# Patient Record
Sex: Female | Born: 1970
Health system: Southern US, Community
[De-identification: ages and names within clinical notes are randomized; demographics above are authoritative.]

## PROBLEM LIST (undated history)

## (undated) DIAGNOSIS — M549 Dorsalgia, unspecified: Secondary | ICD-10-CM

## (undated) DIAGNOSIS — R7303 Prediabetes: Secondary | ICD-10-CM

## (undated) DIAGNOSIS — F32A Depression, unspecified: Secondary | ICD-10-CM

## (undated) DIAGNOSIS — D649 Anemia, unspecified: Secondary | ICD-10-CM

## (undated) DIAGNOSIS — K760 Fatty (change of) liver, not elsewhere classified: Secondary | ICD-10-CM

## (undated) DIAGNOSIS — K589 Irritable bowel syndrome without diarrhea: Secondary | ICD-10-CM

## (undated) DIAGNOSIS — N920 Excessive and frequent menstruation with regular cycle: Secondary | ICD-10-CM

## (undated) DIAGNOSIS — K829 Disease of gallbladder, unspecified: Secondary | ICD-10-CM

## (undated) DIAGNOSIS — K219 Gastro-esophageal reflux disease without esophagitis: Secondary | ICD-10-CM

## (undated) DIAGNOSIS — E7800 Pure hypercholesterolemia, unspecified: Secondary | ICD-10-CM

## (undated) DIAGNOSIS — F419 Anxiety disorder, unspecified: Secondary | ICD-10-CM

## (undated) DIAGNOSIS — T7840XA Allergy, unspecified, initial encounter: Secondary | ICD-10-CM

## (undated) DIAGNOSIS — G43909 Migraine, unspecified, not intractable, without status migrainosus: Secondary | ICD-10-CM

## (undated) DIAGNOSIS — N979 Female infertility, unspecified: Secondary | ICD-10-CM

## (undated) DIAGNOSIS — F329 Major depressive disorder, single episode, unspecified: Secondary | ICD-10-CM

## (undated) DIAGNOSIS — K5900 Constipation, unspecified: Secondary | ICD-10-CM

## (undated) DIAGNOSIS — M1990 Unspecified osteoarthritis, unspecified site: Secondary | ICD-10-CM

## (undated) DIAGNOSIS — M2550 Pain in unspecified joint: Secondary | ICD-10-CM

## (undated) HISTORY — DX: Excessive and frequent menstruation with regular cycle: N92.0

## (undated) HISTORY — DX: Constipation, unspecified: K59.00

## (undated) HISTORY — DX: Disease of gallbladder, unspecified: K82.9

## (undated) HISTORY — DX: Fatty (change of) liver, not elsewhere classified: K76.0

## (undated) HISTORY — DX: Anemia, unspecified: D64.9

## (undated) HISTORY — DX: Depression, unspecified: F32.A

## (undated) HISTORY — DX: Migraine, unspecified, not intractable, without status migrainosus: G43.909

## (undated) HISTORY — DX: Irritable bowel syndrome, unspecified: K58.9

## (undated) HISTORY — DX: Gastro-esophageal reflux disease without esophagitis: K21.9

## (undated) HISTORY — DX: Pure hypercholesterolemia, unspecified: E78.00

## (undated) HISTORY — DX: Major depressive disorder, single episode, unspecified: F32.9

## (undated) HISTORY — DX: Dorsalgia, unspecified: M54.9

## (undated) HISTORY — DX: Female infertility, unspecified: N97.9

## (undated) HISTORY — DX: Pain in unspecified joint: M25.50

## (undated) HISTORY — DX: Allergy, unspecified, initial encounter: T78.40XA

## (undated) HISTORY — PX: FOOT SURGERY: SHX648

## (undated) HISTORY — DX: Anxiety disorder, unspecified: F41.9

## (undated) HISTORY — DX: Unspecified osteoarthritis, unspecified site: M19.90

## (undated) HISTORY — PX: CHOLECYSTECTOMY: SHX55

---

## 1998-01-20 ENCOUNTER — Ambulatory Visit (HOSPITAL_COMMUNITY): Admission: RE | Admit: 1998-01-20 | Discharge: 1998-01-20 | Payer: Self-pay | Admitting: Gynecology

## 1998-02-01 ENCOUNTER — Inpatient Hospital Stay (HOSPITAL_COMMUNITY): Admission: AD | Admit: 1998-02-01 | Discharge: 1998-02-01 | Payer: Self-pay | Admitting: Obstetrics and Gynecology

## 1998-02-06 ENCOUNTER — Inpatient Hospital Stay (HOSPITAL_COMMUNITY): Admission: AD | Admit: 1998-02-06 | Discharge: 1998-02-06 | Payer: Self-pay | Admitting: Gynecology

## 1998-02-18 ENCOUNTER — Inpatient Hospital Stay (HOSPITAL_COMMUNITY): Admission: AD | Admit: 1998-02-18 | Discharge: 1998-02-20 | Payer: Self-pay | Admitting: Gynecology

## 1999-04-12 ENCOUNTER — Other Ambulatory Visit: Admission: RE | Admit: 1999-04-12 | Discharge: 1999-04-12 | Payer: Self-pay | Admitting: Obstetrics and Gynecology

## 1999-10-24 ENCOUNTER — Emergency Department (HOSPITAL_COMMUNITY): Admission: EM | Admit: 1999-10-24 | Discharge: 1999-10-24 | Payer: Self-pay | Admitting: Emergency Medicine

## 2000-03-18 ENCOUNTER — Emergency Department (HOSPITAL_COMMUNITY): Admission: EM | Admit: 2000-03-18 | Discharge: 2000-03-18 | Payer: Self-pay | Admitting: Emergency Medicine

## 2000-05-21 ENCOUNTER — Other Ambulatory Visit: Admission: RE | Admit: 2000-05-21 | Discharge: 2000-05-21 | Payer: Self-pay | Admitting: Gynecology

## 2000-10-30 HISTORY — PX: LIGAMENT REPAIR: SHX5444

## 2001-06-20 ENCOUNTER — Other Ambulatory Visit: Admission: RE | Admit: 2001-06-20 | Discharge: 2001-06-20 | Payer: Self-pay | Admitting: Gynecology

## 2001-12-27 ENCOUNTER — Emergency Department (HOSPITAL_COMMUNITY): Admission: EM | Admit: 2001-12-27 | Discharge: 2001-12-27 | Payer: Self-pay | Admitting: Unknown Physician Specialty

## 2002-06-20 ENCOUNTER — Other Ambulatory Visit: Admission: RE | Admit: 2002-06-20 | Discharge: 2002-06-20 | Payer: Self-pay | Admitting: Gynecology

## 2003-07-02 ENCOUNTER — Other Ambulatory Visit: Admission: RE | Admit: 2003-07-02 | Discharge: 2003-07-02 | Payer: Self-pay | Admitting: Gynecology

## 2004-07-06 ENCOUNTER — Other Ambulatory Visit: Admission: RE | Admit: 2004-07-06 | Discharge: 2004-07-06 | Payer: Self-pay | Admitting: Gynecology

## 2004-10-30 LAB — HM COLONOSCOPY: HM Colonoscopy: NORMAL

## 2004-11-16 ENCOUNTER — Encounter: Admission: RE | Admit: 2004-11-16 | Discharge: 2004-11-16 | Payer: Self-pay | Admitting: Internal Medicine

## 2004-12-26 ENCOUNTER — Ambulatory Visit (HOSPITAL_COMMUNITY): Admission: RE | Admit: 2004-12-26 | Discharge: 2004-12-26 | Payer: Self-pay

## 2004-12-26 ENCOUNTER — Encounter (INDEPENDENT_AMBULATORY_CARE_PROVIDER_SITE_OTHER): Payer: Self-pay | Admitting: *Deleted

## 2005-08-03 ENCOUNTER — Ambulatory Visit (HOSPITAL_COMMUNITY): Admission: RE | Admit: 2005-08-03 | Discharge: 2005-08-03 | Payer: Self-pay | Admitting: *Deleted

## 2005-08-03 ENCOUNTER — Encounter (INDEPENDENT_AMBULATORY_CARE_PROVIDER_SITE_OTHER): Payer: Self-pay | Admitting: Specialist

## 2005-08-03 HISTORY — PX: COLONOSCOPY: SHX174

## 2005-09-26 ENCOUNTER — Other Ambulatory Visit: Admission: RE | Admit: 2005-09-26 | Discharge: 2005-09-26 | Payer: Self-pay | Admitting: Gynecology

## 2005-09-29 HISTORY — PX: TUBAL LIGATION: SHX77

## 2005-10-11 ENCOUNTER — Inpatient Hospital Stay (HOSPITAL_COMMUNITY): Admission: EM | Admit: 2005-10-11 | Discharge: 2005-10-13 | Payer: Self-pay | Admitting: Gynecology

## 2005-10-11 ENCOUNTER — Ambulatory Visit (HOSPITAL_COMMUNITY): Admission: RE | Admit: 2005-10-11 | Discharge: 2005-10-11 | Payer: Self-pay | Admitting: Gynecology

## 2005-10-11 ENCOUNTER — Encounter (INDEPENDENT_AMBULATORY_CARE_PROVIDER_SITE_OTHER): Payer: Self-pay | Admitting: Specialist

## 2005-10-11 ENCOUNTER — Ambulatory Visit (HOSPITAL_BASED_OUTPATIENT_CLINIC_OR_DEPARTMENT_OTHER): Admission: RE | Admit: 2005-10-11 | Discharge: 2005-10-11 | Payer: Self-pay | Admitting: Gynecology

## 2005-10-11 HISTORY — PX: PELVIC LAPAROSCOPY: SHX162

## 2006-10-02 ENCOUNTER — Other Ambulatory Visit: Admission: RE | Admit: 2006-10-02 | Discharge: 2006-10-02 | Payer: Self-pay | Admitting: Gynecology

## 2006-10-09 IMAGING — CR DG CHEST 2V
2 series · 2 of 2 positions shown · non-contrast
Comparison: none

CLINICAL DATA: Left chest pain. 

 CHEST – TWO VIEW:

[view not recorded (1 of 2)]
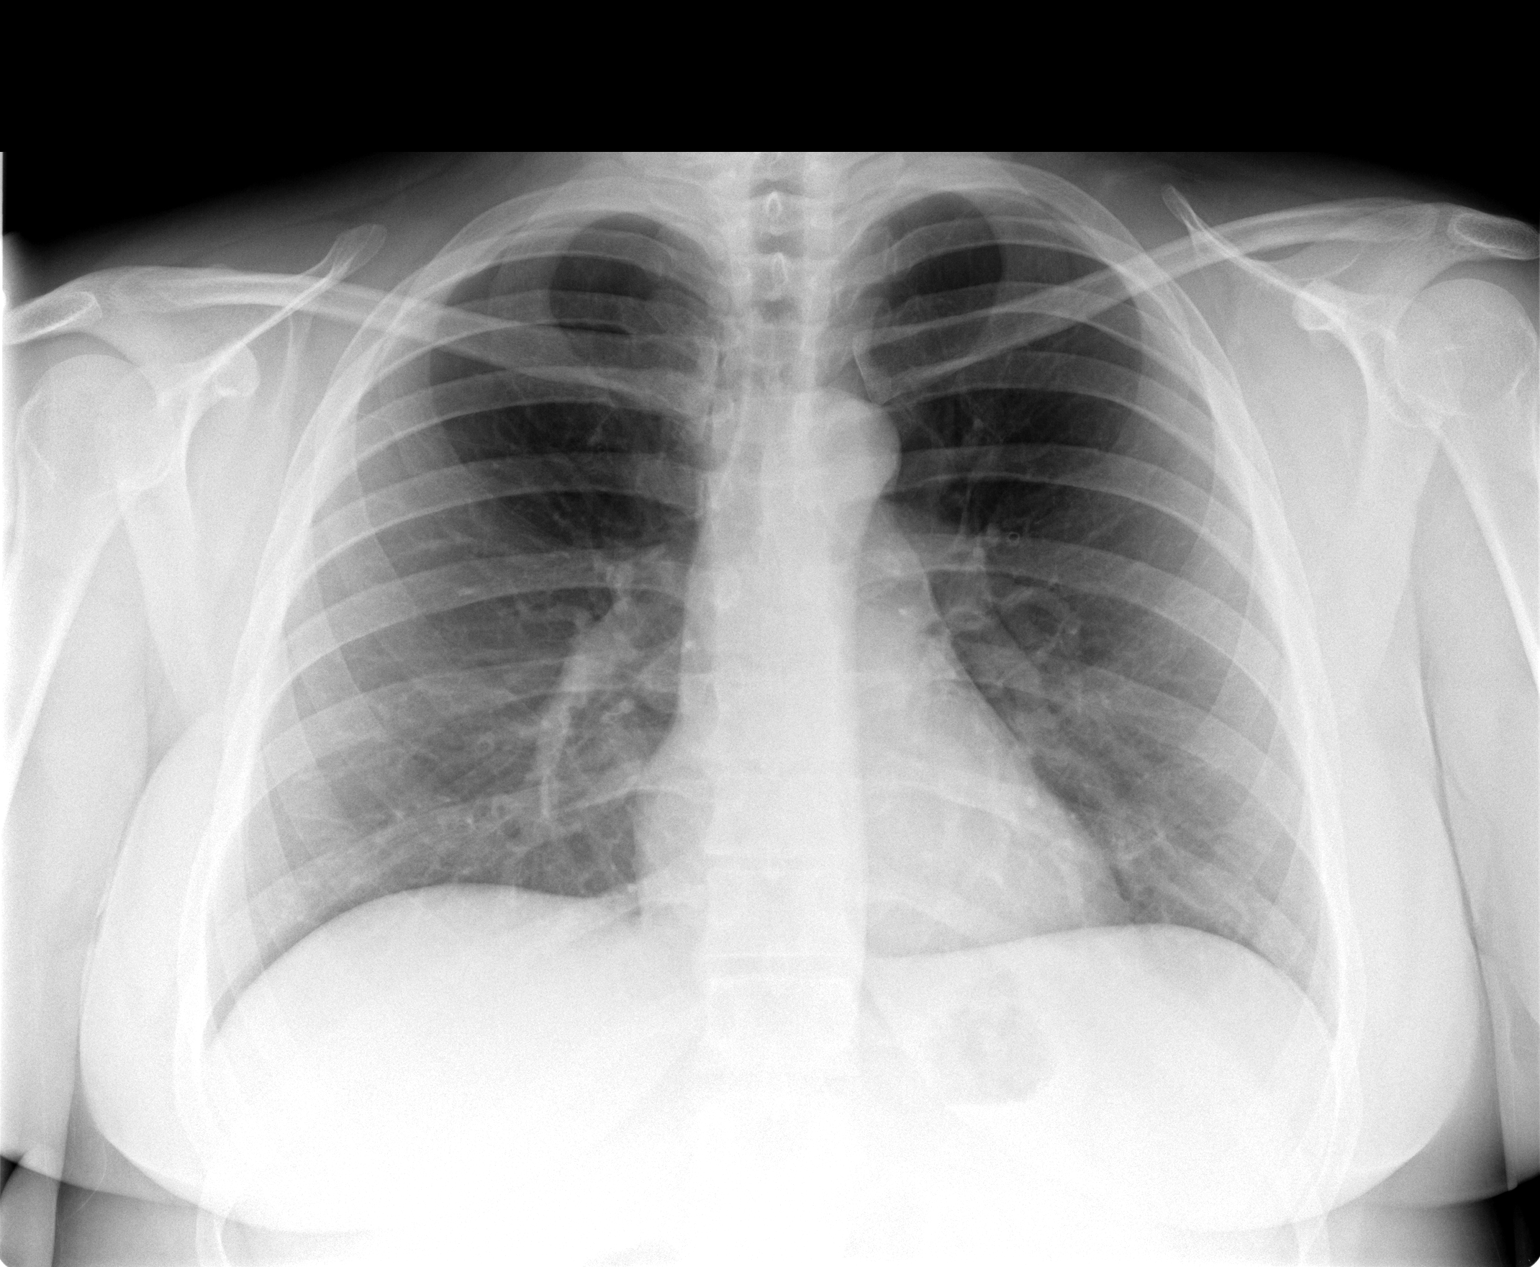

[view not recorded (2 of 2)]
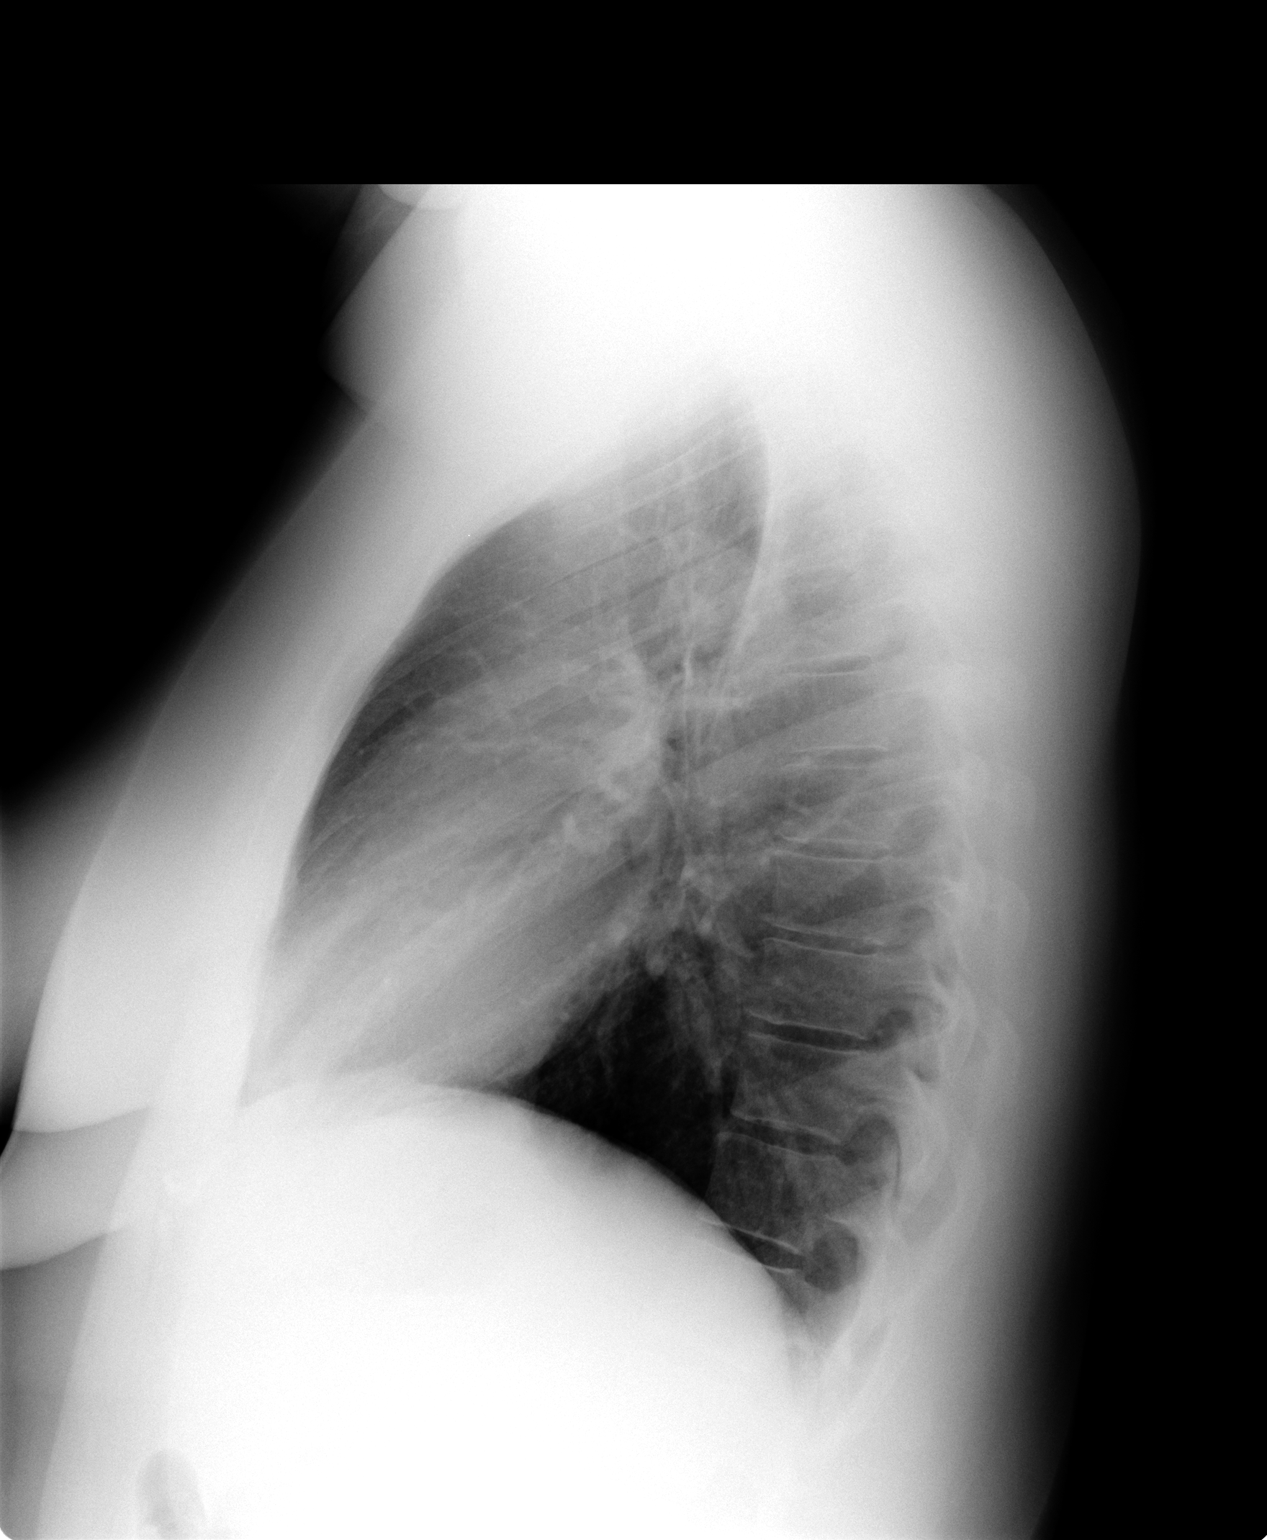

[2 of 2 positions shown; findings below may reference images not displayed]

FINDINGS: The heart size and mediastinal contours are normal. The lungs are clear. The visualized skeleton is unremarkable.
IMPRESSION: No active disease.

## 2007-10-08 ENCOUNTER — Other Ambulatory Visit: Admission: RE | Admit: 2007-10-08 | Discharge: 2007-10-08 | Payer: Self-pay | Admitting: Gynecology

## 2008-10-29 ENCOUNTER — Ambulatory Visit: Payer: Self-pay | Admitting: Gynecology

## 2008-10-29 ENCOUNTER — Other Ambulatory Visit: Admission: RE | Admit: 2008-10-29 | Discharge: 2008-10-29 | Payer: Self-pay | Admitting: Gynecology

## 2008-10-29 ENCOUNTER — Encounter: Payer: Self-pay | Admitting: Gynecology

## 2008-11-06 ENCOUNTER — Ambulatory Visit: Payer: Self-pay | Admitting: Gynecology

## 2008-11-09 ENCOUNTER — Ambulatory Visit: Payer: Self-pay | Admitting: Gynecology

## 2009-10-07 ENCOUNTER — Ambulatory Visit: Payer: Self-pay | Admitting: Gynecology

## 2009-11-24 ENCOUNTER — Ambulatory Visit: Payer: Self-pay | Admitting: Gynecology

## 2009-11-24 ENCOUNTER — Other Ambulatory Visit: Admission: RE | Admit: 2009-11-24 | Discharge: 2009-11-24 | Payer: Self-pay | Admitting: Gynecology

## 2009-11-25 ENCOUNTER — Encounter: Payer: Self-pay | Admitting: Internal Medicine

## 2009-11-25 ENCOUNTER — Ambulatory Visit: Payer: Self-pay | Admitting: Gynecology

## 2009-12-15 ENCOUNTER — Ambulatory Visit: Payer: Self-pay | Admitting: Internal Medicine

## 2009-12-15 DIAGNOSIS — J309 Allergic rhinitis, unspecified: Secondary | ICD-10-CM | POA: Insufficient documentation

## 2009-12-15 DIAGNOSIS — K219 Gastro-esophageal reflux disease without esophagitis: Secondary | ICD-10-CM | POA: Insufficient documentation

## 2009-12-15 DIAGNOSIS — F3289 Other specified depressive episodes: Secondary | ICD-10-CM | POA: Insufficient documentation

## 2009-12-15 DIAGNOSIS — D649 Anemia, unspecified: Secondary | ICD-10-CM | POA: Insufficient documentation

## 2009-12-15 DIAGNOSIS — E669 Obesity, unspecified: Secondary | ICD-10-CM | POA: Insufficient documentation

## 2009-12-15 DIAGNOSIS — R51 Headache: Secondary | ICD-10-CM | POA: Insufficient documentation

## 2009-12-15 DIAGNOSIS — R519 Headache, unspecified: Secondary | ICD-10-CM | POA: Insufficient documentation

## 2009-12-15 DIAGNOSIS — F329 Major depressive disorder, single episode, unspecified: Secondary | ICD-10-CM

## 2009-12-15 HISTORY — DX: Anemia, unspecified: D64.9

## 2010-03-10 ENCOUNTER — Ambulatory Visit: Payer: Self-pay | Admitting: Internal Medicine

## 2010-03-10 LAB — CONVERTED CEMR LAB
BUN: 15 mg/dL (ref 6–23)
Basophils Absolute: 0 10*3/uL (ref 0.0–0.1)
Basophils Relative: 0.3 % (ref 0.0–3.0)
CO2: 27 meq/L (ref 19–32)
Calcium: 8.8 mg/dL (ref 8.4–10.5)
Chloride: 107 meq/L (ref 96–112)
Creatinine, Ser: 0.8 mg/dL (ref 0.4–1.2)
Eosinophils Absolute: 0.1 10*3/uL (ref 0.0–0.7)
Eosinophils Relative: 1.1 % (ref 0.0–5.0)
Ferritin: 3.8 ng/mL — ABNORMAL LOW (ref 10.0–291.0)
Folate: 6.4 ng/mL
GFR calc non Af Amer: 83.95 mL/min (ref >60)
Glucose, Bld: 88 mg/dL (ref 70–99)
HCT: 34.6 % — ABNORMAL LOW (ref 36.0–46.0)
Hemoglobin: 11.8 g/dL — ABNORMAL LOW (ref 12.0–15.0)
Hgb A1c MFr Bld: 6.2 % (ref 4.6–6.5)
Iron: 35 ug/dL — ABNORMAL LOW (ref 42–145)
Lymphocytes Relative: 21.2 % (ref 12.0–46.0)
Lymphs Abs: 1.7 10*3/uL (ref 0.7–4.0)
MCHC: 34 g/dL (ref 30.0–36.0)
MCV: 80 fL (ref 78.0–100.0)
Monocytes Absolute: 0.5 10*3/uL (ref 0.1–1.0)
Monocytes Relative: 6.3 % (ref 3.0–12.0)
Neutro Abs: 5.5 10*3/uL (ref 1.4–7.7)
Neutrophils Relative %: 71.1 % (ref 43.0–77.0)
Platelets: 303 10*3/uL (ref 150.0–400.0)
Potassium: 4.4 meq/L (ref 3.5–5.1)
RBC: 4.32 M/uL (ref 3.87–5.11)
RDW: 16.2 % — ABNORMAL HIGH (ref 11.5–14.6)
Saturation Ratios: 7 % — ABNORMAL LOW (ref 20.0–50.0)
Sodium: 141 meq/L (ref 135–145)
Transferrin: 356.1 mg/dL (ref 212.0–360.0)
Vit D, 25-Hydroxy: 40 ng/mL (ref 30–89)
Vitamin B-12: 274 pg/mL (ref 211–911)
WBC: 7.8 10*3/uL (ref 4.5–10.5)

## 2010-03-17 ENCOUNTER — Ambulatory Visit: Payer: Self-pay | Admitting: Internal Medicine

## 2010-03-17 DIAGNOSIS — R109 Unspecified abdominal pain: Secondary | ICD-10-CM | POA: Insufficient documentation

## 2010-04-22 ENCOUNTER — Ambulatory Visit: Payer: Self-pay | Admitting: Gynecology

## 2010-07-05 ENCOUNTER — Ambulatory Visit: Payer: Self-pay | Admitting: Internal Medicine

## 2010-07-05 LAB — CONVERTED CEMR LAB
Basophils Absolute: 0 10*3/uL (ref 0.0–0.1)
Hemoglobin: 12.3 g/dL (ref 12.0–15.0)
Hgb A1c MFr Bld: 6.3 % (ref 4.6–6.5)
Lymphocytes Relative: 22.3 % (ref 12.0–46.0)
Monocytes Relative: 5.3 % (ref 3.0–12.0)
Neutro Abs: 6.3 10*3/uL (ref 1.4–7.7)
Neutrophils Relative %: 71.1 % (ref 43.0–77.0)
RBC: 4.32 M/uL (ref 3.87–5.11)
RDW: 15.3 % — ABNORMAL HIGH (ref 11.5–14.6)

## 2010-07-12 ENCOUNTER — Ambulatory Visit: Payer: Self-pay | Admitting: Internal Medicine

## 2010-10-07 ENCOUNTER — Encounter: Payer: Self-pay | Admitting: Internal Medicine

## 2010-10-07 ENCOUNTER — Ambulatory Visit: Payer: Self-pay | Admitting: Internal Medicine

## 2010-10-07 LAB — CONVERTED CEMR LAB
Basophils Absolute: 0 10*3/uL (ref 0.0–0.1)
Eosinophils Relative: 1 % (ref 0.0–5.0)
Ferritin: 5.8 ng/mL — ABNORMAL LOW (ref 10.0–291.0)
HCT: 39.2 % (ref 36.0–46.0)
IgA: 218 mg/dL (ref 68–378)
Lymphocytes Relative: 21.8 % (ref 12.0–46.0)
Monocytes Relative: 5.3 % (ref 3.0–12.0)
Neutrophils Relative %: 71.7 % (ref 43.0–77.0)
Platelets: 296 10*3/uL (ref 150.0–400.0)
RDW: 15.6 % — ABNORMAL HIGH (ref 11.5–14.6)
Tissue Transglutaminase Ab, IgA: 9.4 units (ref <20)
WBC: 8.1 10*3/uL (ref 4.5–10.5)

## 2010-10-17 ENCOUNTER — Ambulatory Visit: Payer: Self-pay | Admitting: Internal Medicine

## 2010-10-17 DIAGNOSIS — D509 Iron deficiency anemia, unspecified: Secondary | ICD-10-CM | POA: Insufficient documentation

## 2010-10-30 HISTORY — PX: KNEE ARTHROSCOPY: SUR90

## 2010-12-01 NOTE — Assessment & Plan Note (Signed)
Summary: 4 month rov/njr   Vital Signs:  Patient profile:   40 year old female Menstrual status:  regular LMP:     06/23/2010 Weight:      224 pounds Pulse rate:   88 / minute BP sitting:   120 / 80  (right arm) Cuff size:   large  Vitals Entered By: Romualdo Bolk, CMA (AAMA) (July 12, 2010 11:27 AM) CC: Follow-up visit on labs LMP (date): 06/23/2010 LMP - Character: light Menarche (age onset years): 10   Menses interval (days): 28 Menstrual flow (days): 1 Enter LMP: 06/23/2010 Last PAP Result normal   History of Present Illness: Brandy Maynard comesin for follow up of her  anemia irondeficincy and blood sugar. Since last visit  no chnage in status . Saw Dr Lily Peer  for back  pelvic pain and no reasonable interventionf or now.     taking  iron only a few times a week  with Vit c  constipated   with more  .  Scant periods now on ocps . Iron can bother her  ibs    usually   eating more red meat and spinach.     Preventive Screening-Counseling & Management  Alcohol-Tobacco     Alcohol drinks/day: <1     Alcohol type: wine     Smoking Status: never  Caffeine-Diet-Exercise     Caffeine use/day: 1 - 2     Does Patient Exercise: yes  Current Medications (verified): 1)  Prilosec Otc 20 Mg Tbec (Omeprazole Magnesium) .Marland Kitchen.. 1 By Mouth Once Daily 2)  Loestrin 24 Fe 1-20 Mg-Mcg Tabs (Norethin Ace-Eth Estrad-Fe) .Marland Kitchen.. 1 By Mouth Once Daily 3)  Aldactone 25 Mg Tabs (Spironolactone) .... Takes During Her Menes  Allergies (verified): 1)  ! Penicillin  Past History:  Past medical, surgical, family and social histories (including risk factors) reviewed, and no changes noted (except as noted below).  Past Medical History: G3P2 Allergic rhinitis Depression GERD Headache Migraines  Heavy menses on ocps     anemia iron defic mild IBS by hx  and had neg colonscopy.   Past Surgical History: Reviewed history from 03/17/2010 and no changes  required. Cholecystectomy Tubal Ligation/Scar Tissure removal 09/2005 ovarian cyst. Rt ankle Ligament repair 2002  Past History:  Care Management: Gynecology: Lily Peer  Family History: Reviewed history from 12/15/2009 and no changes required. Father: Smoker Mother: Arthritis Siblings: Sister- Healthy Dm in grandparent generation.  LIPids [place]  Social History: Reviewed history from 12/15/2009 and no changes required. 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    Physical Exam  General:  Well-developed,well-nourished,in no acute distress; alert,appropriate and cooperative throughout examination Head:  normocephalic and atraumatic.   Psych:  good eye contact.   reviewed labs and  evaluation  Impression & Recommendations:  Problem # 1:  ANEMIA (ICD-285.9) Assessment Improved  hg good   still low ferritin  3.8   Hgb: 12.3 (07/05/2010)   Hct: 36.0 (07/05/2010)   Platelets: 282.0 (07/05/2010) RBC: 4.32 (07/05/2010)   RDW: 15.3 (07/05/2010)   WBC: 8.8 (07/05/2010) MCV: 83.3 (07/05/2010)   MCHC: 34.2 (07/05/2010) Ferritin: 3.8 (07/05/2010) Iron: 35 (03/10/2010)   % Sat: 7.0 (03/10/2010) B12: 274 (03/10/2010)   Folate: 6.4 (03/10/2010)     Problem # 2:  HYPERGLYCEMIA (ICD-790.29) Assessment: Unchanged  lifestyle  intervention strategies and weight loss a1c 6.3 Labs Reviewed: Creat: 0.8 (03/10/2010)     Problem # 3:  OBESITY (ICD-278.00) continue weight watchers and weight loss   Problem # 4:  ? IBS  reported  prev eval   Complete Medication List: 1)  Prilosec Otc 20 Mg Tbec (Omeprazole magnesium) .Marland Kitchen.. 1 by mouth once daily 2)  Loestrin 24 Fe 1-20 Mg-mcg Tabs (Norethin ace-eth estrad-fe) .Marland Kitchen.. 1 by mouth once daily 3)  Aldactone 25 Mg  Tabs (Spironolactone) .... Takes during her menes  Other Orders: Admin 1st Vaccine (16109) Flu Vaccine 77yrs + [phone])  Patient Instructions: 1)  continue  iron daily  as possible.  2)  fruit ok for constipation. 3)  losing weight will help your blood sugar .  4)  in 3 months    ferritin  cbc, diff  and Celiac panel , Total immunoglobulin A  5)  dx   iron deficiency      Flu Vaccine Consent Questions     Do you have a history of severe allergic reactions to this vaccine? no    Any prior history of allergic reactions to egg and/or gelatin? no    Do you have a sensitivity to the preservative Thimersol? no    Do you have a past history of Guillan-Barre Syndrome? no    Do you currently have an acute febrile illness? no    Have you ever had a severe reaction to latex? no    Vaccine information given and explained to patient? yes    Are you currently pregnant? no    Lot Number:[id number]   Exp Date:04/29/2011   Site Given  Left Deltoid IMlu Romualdo Bolk, CMA (AAMA)  July 12, 2010 11:31 AM

## 2010-12-01 NOTE — Assessment & Plan Note (Signed)
Summary: 3 MONTH FOLLOW UP/CJR/pt rescd from bump//ccm   Vital Signs:  Patient profile:   40 year old female Menstrual status:  regular LMP:     10/09/2010 Height:      65.25 inches Weight:      229 pounds BMI:     37.95 Pulse rate:   80 / minute BP sitting:   120 / 80  (left arm) Cuff size:   large  Vitals Entered By: Romualdo Bolk, CMA (AAMA) (October 17, 2010 1:23 PM) CC: Follow-up visit on labs LMP (date): 10/09/2010 LMP - Character: light Menarche (age onset years): 10   Menses interval (days): 28 Menstrual flow (days): 1 Enter LMP: 10/09/2010 Last PAP Result normal   History of Present Illness: Brandy Maynard  comes in today  for follow up of her  iron deficincy .Marland KitchenMarland KitchenSince last visit she has only  taken   2 x per  week.  because of constipaton se .    So in the past 2 weeks   has taken slow iron about 4 days per week.  rtied  to decrease the prolosec to every other day fora while recently back up for   signs  Also had some   orthopedic symptoms with pain  Left middle finger  and cyst ..alos wrist  pain    and has plica in right knee. Got  a   steroid sho t  via Dr Marcellus Scott some temporary help. .    Had labs tests and ok  for inflammatory problem   and vit d was high   ? normal .. Not on a supplement       Preventive Screening-Counseling & Management  Alcohol-Tobacco     Alcohol drinks/day: <1     Alcohol type: wine     Smoking Status: never  Caffeine-Diet-Exercise     Caffeine use/day: 1 - 2     Does Patient Exercise: yes  Current Medications (verified): 1)  Prilosec Otc 20 Mg Tbec (Omeprazole Magnesium) .Marland Kitchen.. 1 By Mouth Once Daily 2)  Loestrin 24 Fe 1-20 Mg-Mcg Tabs (Norethin Ace-Eth Estrad-Fe) .Marland Kitchen.. 1 By Mouth Once Daily 3)  Aldactone 25 Mg Tabs (Spironolactone) .... Takes During Her Menes  Allergies (verified): 1)  ! Penicillin  Past History:  Past medical, surgical, family and social histories (including risk factors) reviewed for relevance to  current acute and chronic problems.  Past Medical History: Reviewed history from 07/12/2010 and no changes required. G3P2 Allergic rhinitis Depression GERD Headache Migraines  Heavy menses on ocps     anemia iron defic mild IBS by hx  and had neg colonscopy.   Past Surgical History: Reviewed history from 03/17/2010 and no changes required. Cholecystectomy Tubal Ligation/Scar Tissure removal 09/2005 ovarian cyst. Rt ankle Ligament repair 2002  Past History:  Care Management: Gynecology: Domingo Mend  Family History: Reviewed history from 12/15/2009 and no changes required. Father: Smoker Mother: Arthritis Siblings: Sister- Healthy Dm in grandparent generation.  LIPids [place]  Social History: Reviewed history from 12/15/2009 and no changes required. 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         ocass hemorrhoid  Physical Exam  General:  Well-developed,well-nourished,in no acute distress; alert,appropriate and cooperative throughout examination Psych:  good eye contact, not anxious appearing, and not depressed appearing.   reviewed labs  Impression & Recommendations:  Problem # 1:  ANEMIA (ICD-285.9)  better but stil iron deficient.     disc this and will take iron more reg and follow up  when iron stores repleted can stop iron and see how she does.   Hgb: 13.3 (10/07/2010)   Hct: 39.2 (10/07/2010)   Platelets: 296.0 (10/07/2010) RBC: 4.58 (10/07/2010)   RDW: 15.6 (10/07/2010)   WBC: 8.1 (10/07/2010) MCV: 85.4 (10/07/2010)   MCHC: 33.9 (10/07/2010) Ferritin: 5.8 (10/07/2010) Iron: 35 (03/10/2010)   % Sat: 7.0 (03/10/2010) B12: 274 (03/10/2010)   Folate: 6.4 (03/10/2010)     Problem # 2:  GERD (ICD-530.81) counseled  weight loss will help.  pot aware.  Her  updated medication list for this problem includes:    Prilosec Otc 20 Mg Tbec (Omeprazole magnesium) .Marland Kitchen... 1 by mouth once daily  Problem # 3:  HYPERGLYCEMIA (ICD-790.29)  going back to weight watchers   dm runs in family.     aware   Labs Reviewed: Creat: 0.8 (03/10/2010)     Complete Medication List: 1)  Prilosec Otc 20 Mg Tbec (Omeprazole magnesium) .Marland Kitchen.. 1 by mouth once daily 2)  Loestrin 24 Fe 1-20 Mg-mcg Tabs (Norethin ace-eth estrad-fe) .Marland Kitchen.. 1 by mouth once daily 3)  Aldactone 25 Mg Tabs (Spironolactone) .... Takes during her menes  Patient Instructions: 1)  get copy of labs  sent to [place] for our records.    2)  consider a pill box  to track your  meds. 3)  try taking the iron separate from  the prilosec to  possibly enhance absorption.    every day. 4)  cbc diff  ferritin   hga1c  in March and return office visit.  5)  dx  hyperglycemia  and iron deficiency .     Orders Added: 1)  Est. Patient Level III [16109]

## 2010-12-01 NOTE — Assessment & Plan Note (Signed)
Summary: fup labs/cc   Vital Signs:  Patient profile:   40 year old female Menstrual status:  regular Height:      65.25 inches Weight:      228 pounds BP sitting:   110 / 80  (right arm) CC: FU labs, Depression, Headache   History of Present Illness: Brandy Maynard comes in today   for follow up of  above problems . Since last visit  here  there have been no major changes in health status  . Has  lost 19 pound. s Periods :     Concern about cyst in ovaries.   minimal  W/d bleeding   but last month 3 days of bleeding.  some llq pain to back.  and uses heating pad.    H sof   cysts on rigfht.   hx of scariing  right side.  Mood   Some stress    external.   weaned off antidepressant.  Sleep ok.      Preventive Screening-Counseling & Management  Alcohol-Tobacco     Alcohol drinks/day: <1     Alcohol type: wine     Smoking Status: never  Caffeine-Diet-Exercise     Caffeine use/day: 1 - 2     Does Patient Exercise: yes  Current Medications (verified): 1)  Prilosec Otc 20 Mg Tbec (Omeprazole Magnesium) .Marland Kitchen.. 1 By Mouth Once Daily 2)  Loestrin 24 Fe 1-20 Mg-Mcg Tabs (Norethin Ace-Eth Estrad-Fe) .Marland Kitchen.. 1 By Mouth Once Daily 3)  Aldactone 25 Mg Tabs (Spironolactone) .... Takes During Her Menes  Allergies (verified): 1)  ! Penicillin  Past History:  Past medical, surgical, family and social histories (including risk factors) reviewed, and no changes noted (except as noted below).  Past Medical History: Reviewed history from 12/15/2009 and no changes required. G3P2 Allergic rhinitis Depression GERD Headache Migraines   Past Surgical History: Cholecystectomy Tubal Ligation/Scar Tissure removal 09/2005 ovarian cyst. Rt ankle Ligament repair 2002  Family History: Reviewed history from 12/15/2009 and no changes required. Father: Smoker Mother: Arthritis Siblings: Sister- Healthy Dm in grandparent generation.  LIPids [place]  Social History: Reviewed history from  12/15/2009 and no changes required. 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    Physical Exam  General:  Well-developed,well-nourished,in no acute distress; alert,appropriate and cooperative throughout examination Head:  normocephalic and atraumatic.   Neck:  No deformities, masses, or tenderness noted. Lungs:  normal respiratory effort, no intercostal retractions, and no accessory muscle use.   Heart:  Normal rate and regular rhythm. S1 and S2 normal without gallop, murmur, click, rub or other extra sounds.no lifts.   Abdomen:  Bowel sounds positive,abdomen soft and non-tender without masses, organomegaly or   noted. point to low left pelvica rea as area of discomfort  Pulses:  pulses intact without delay   Extremities:  no clubbing cyanosis or edema  Neurologic:  non focal  Skin:  turgor normal, color normal, no ecchymoses, and no petechiae.   Cervical Nodes:  No lymphadenopathy noted Psych:  Oriented X3, good eye contact, not anxious appearing, and not depressed appearing.     Impression & Recommendations:  Problem # 1:  ANEMIA (ICD-285.9)  needs recheck   no excessive bleeding  Hgb:  11.8 (03/10/2010)   Hct: 34.6 (03/10/2010)   Platelets: 303.0 (03/10/2010) RBC: 4.32 (03/10/2010)   RDW: 16.2 (03/10/2010)   WBC: 7.8 (03/10/2010) MCV: 80.0 (03/10/2010)   MCHC: 34.0 (03/10/2010) Ferritin: 3.8 (03/10/2010) Iron: 35 (03/10/2010)   % Sat: 7.0 (03/10/2010) B12: 274 (03/10/2010)   Folate: 6.4 (03/10/2010)     Problem # 2:  PELVIC PAIN, LEFT (ICD-789.09)  Problem # 3:  OBESITY (ICD-278.00) Assessment: Improved 19 # weight loss    continue   encouraged   continue weight watchers.   Problem # 4:  HYPERGLYCEMIA (ICD-790.29) Assessment: Improved fbs food  a1c could be better   counseled and . follow up  in 3-4 months   Problem # 5:  DEPRESSION (ICD-311) Assessment: Improved doing ok off medications.     Problem # 6:  GERD (ICD-530.81) Assessment: Improved  Her updated medication list for this problem includes:    Prilosec Otc 20 Mg Tbec (Omeprazole magnesium) .Marland Kitchen... 1 by mouth once daily  Complete Medication List: 1)  Prilosec Otc 20 Mg Tbec (Omeprazole magnesium) .Marland Kitchen.. 1 by mouth once daily 2)  Loestrin 24 Fe 1-20 Mg-mcg Tabs (Norethin ace-eth estrad-fe) .Marland Kitchen.. 1 by mouth once daily 3)  Aldactone 25 Mg Tabs (Spironolactone) .... Takes during her menes  Patient Instructions: 1)  would get  back with Dr Steward Ros about the  pelvic pain calendar the signs in the mean time.  2)  Increase iron    can take 250 mg vit d with an iron pill  once a day or as tolerated to increase absortpion. 3)  B12 is slightly low   can increase foods high in vit b12 . 4)  continue healthy weight loss. 5)  CBC w/ Diff prior to visit ICD-9 :  6)  ferritin :  dx anemia  7)  HgBA1c prior to visit  ICD-9:  hyperglycemia  8)  Please schedule a follow-up appointment in 3 -4 months .

## 2010-12-01 NOTE — Assessment & Plan Note (Signed)
Summary: brand new patient/to establish/cjr   Vital Signs:  Patient profile:   40 year old female Menstrual status:  regular LMP:     12/04/2009 Height:      65.25 inches Weight:      238 pounds BMI:     39.44 Pulse rate:   66 / minute BP sitting:   110 / 70  (right arm) Cuff size:   large  Vitals Entered By: Romualdo Bolk, CMA (AAMA) (December 15, 2009 3:43 PM) CC: New Pt to get establish- Seen at Urgent Care for Bronchitic on 2/12. Pt was given biaxin and a cough syrup. LMP (date): 12/04/2009 LMP - Character: light Menarche (age onset years): 10   Menses interval (days): 28 Menstrual flow (days): 1 Menstrual Status regular Enter LMP: 12/04/2009 Last PAP Result normal   History of Present Illness: Brandy Maynard comesin today for first  time   visit  .  Was given  hydrocodone   onset  Feb 12 and    now getting better  but still  hoarseness.    used to See   Dr Delrae Alfred.   SHe is no longer in pvt practice  GERD  : nexium and then prilosec  otc    some break through   on meds about 10 years.   nocturnal no endoscopy and no EGJ.    OCps and on aldactone  week of period for days of cycle.  Mood Just went went off  wellbutrin  in december  last year.   and went off.  though it caused weight gain migraines   ocassinal  stress is a trigger.   uses.  ? old rx med.   midrin.   GYNE  did labs   nl tsh  hg 11.9  lipids nl .  Preventive Care Screening  Pap Smear:    Date:  11/24/2009    Results:  normal   Colonoscopy:    Date:  10/30/2004    Results:  normal    Preventive Screening-Counseling & Management  Alcohol-Tobacco     Alcohol drinks/day: <1     Alcohol type: wine     Smoking Status: never  Caffeine-Diet-Exercise     Caffeine use/day: 1     Does Patient Exercise: yes  Hep-HIV-STD-Contraception     Dental Visit-last 6 months yes     Sun Exposure-Excessive: no  Safety-Violence-Falls     Seat Belt Use: yes     Smoke Detectors: yes      Drug Use:  no.     Current Medications (verified): 1)  Prilosec Otc 20 Mg Tbec (Omeprazole Magnesium) .Marland Kitchen.. 1 By Mouth Once Daily 2)  Loestrin 24 Fe 1-20 Mg-Mcg Tabs (Norethin Ace-Eth Estrad-Fe) .Marland Kitchen.. 1 By Mouth Once Daily 3)  Aldactone 25 Mg Tabs (Spironolactone) .... Takes During Her Menes  Allergies (verified): 1)  ! Penicillin  Past History:  Past Medical History: G3P2 Allergic rhinitis Depression GERD Headache Migraines   Past Surgical History: Cholecystectomy Tubal Ligation/Scar Tissure removal 09/2005 Rt ankle Ligament repair 2002  Past History:  Care Management: Gynecology: Lily Peer  Family History: Father: Smoker Mother: Arthritis Siblings: Sister- Healthy Dm in grandparent generation.  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 Drug use-no Regular exercise-yes Caffeine use/day:  1 Smoking Status:  never Does Patient Exercise:  yes Occupation:  employed Drug Use:  no Seat Belt Use:  yes Sun Exposure-Excessive:  no Dental Care w/in  6 mos.:  yes  Review of Systems  The patient denies anorexia, fever, weight loss, vision loss, decreased hearing, hoarseness, chest pain, syncope, dyspnea on exertion, peripheral edema, prolonged cough, abdominal pain, melena, hematochezia, severe indigestion/heartburn, hematuria, muscle weakness, difficulty walking, depression, unusual weight change, abnormal bleeding, enlarged lymph nodes, and angioedema.         takes aldactone as needed periods for fluid retention  gerd takesp rilosec  Physical Exam  General:  alert, well-developed, well-nourished, and well-hydrated.   Head:  normocephalic and atraumatic.   Eyes:  vision grossly intact, pupils equal, and pupils round.   Ears:  R ear normal, L ear normal, and no external deformities.   Nose:  no external deformity, no external erythema, and no nasal discharge.  congested  Mouth:  pharynx pink and moist.   Neck:  No  deformities, masses, or tenderness noted. Lungs:  Normal respiratory effort, chest expands symmetrically. Lungs are clear to auscultation, no crackles or wheezes.no dullness.   Heart:  Normal rate and regular rhythm. S1 and S2 normal without gallop, murmur, click, rub or other extra sounds.no lifts.   Abdomen:  Bowel sounds positive,abdomen soft and non-tender without masses, organomegaly or   noted. Pulses:  pulses intact without delay   Extremities:  no clubbing cyanosis or edema  Neurologic:  alert & oriented X3, strength normal in all extremities, and gait normal.   Skin:  turgor normal, color normal, no ecchymoses, and no petechiae.   Cervical Nodes:  No lymphadenopathy noted Psych:  Oriented X3, good eye contact, not anxious appearing, and not depressed appearing.     Impression & Recommendations:  Problem # 1:  GERD (ICD-530.81)  Her updated medication list for this problem includes:    Prilosec Otc 20 Mg Tbec (Omeprazole magnesium) .Marland Kitchen... 1 by mouth once daily  Problem # 2:  HYPERGLYCEMIA (ICD-790.29)  Problem # 3:  ANEMIA (ICD-285.9) 11.9 from her gyne  Problem # 4:  resolving uri   Problem # 5:  ALLERGIC RHINITIS (ICD-477.9)  Problem # 6:  DEPRESSION (ICD-311) Assessment: Comment Only  Problem # 7:  OBESITY (ICD-278.00) Assessment: Comment Only  Complete Medication List: 1)  Prilosec Otc 20 Mg Tbec (Omeprazole magnesium) .Marland Kitchen.. 1 by mouth once daily 2)  Loestrin 24 Fe 1-20 Mg-mcg Tabs (Norethin ace-eth estrad-fe) .Marland Kitchen.. 1 by mouth once daily 3)  Aldactone 25 Mg Tabs (Spironolactone) .... Takes during her menes  Patient Instructions: 1)  Continue weight watcher s and healthy lifestyle intervention  2)  in  April or May   do labs  the ROV .  3)  fasting BMP, Hg a1c, Vit amin b12/folicacid,  iron saturation panel , ferritin  , cbc diff plts.  vitamin D .  4)  Dx hyperglycemia and anemia.

## 2010-12-05 ENCOUNTER — Encounter: Payer: Self-pay | Admitting: Gynecology

## 2010-12-16 ENCOUNTER — Other Ambulatory Visit (HOSPITAL_COMMUNITY)
Admission: RE | Admit: 2010-12-16 | Discharge: 2010-12-16 | Disposition: A | Discharge status: Home / Self Care | Payer: 59 | Source: Ambulatory Visit | Attending: Gynecology | Admitting: Gynecology

## 2010-12-16 ENCOUNTER — Other Ambulatory Visit: Payer: Self-pay | Admitting: Gynecology

## 2010-12-16 ENCOUNTER — Encounter (INDEPENDENT_AMBULATORY_CARE_PROVIDER_SITE_OTHER): Payer: 59 | Admitting: Gynecology

## 2010-12-16 DIAGNOSIS — Z01419 Encounter for gynecological examination (general) (routine) without abnormal findings: Secondary | ICD-10-CM | POA: Insufficient documentation

## 2010-12-16 DIAGNOSIS — R635 Abnormal weight gain: Secondary | ICD-10-CM

## 2010-12-16 DIAGNOSIS — Z833 Family history of diabetes mellitus: Secondary | ICD-10-CM

## 2010-12-16 DIAGNOSIS — Z13220 Encounter for screening for lipoid disorders: Secondary | ICD-10-CM

## 2010-12-26 ENCOUNTER — Other Ambulatory Visit: Payer: Self-pay | Admitting: Sports Medicine

## 2010-12-26 DIAGNOSIS — R52 Pain, unspecified: Secondary | ICD-10-CM

## 2010-12-30 ENCOUNTER — Other Ambulatory Visit: Payer: 59

## 2011-01-01 ENCOUNTER — Ambulatory Visit
Admission: RE | Admit: 2011-01-01 | Discharge: 2011-01-01 | Disposition: A | Discharge status: Home / Self Care | Payer: 59 | Source: Ambulatory Visit | Attending: Sports Medicine | Admitting: Sports Medicine

## 2011-01-01 DIAGNOSIS — R52 Pain, unspecified: Secondary | ICD-10-CM

## 2011-01-10 ENCOUNTER — Other Ambulatory Visit (INDEPENDENT_AMBULATORY_CARE_PROVIDER_SITE_OTHER): Payer: 59 | Admitting: Internal Medicine

## 2011-01-10 DIAGNOSIS — D649 Anemia, unspecified: Secondary | ICD-10-CM

## 2011-01-10 DIAGNOSIS — R739 Hyperglycemia, unspecified: Secondary | ICD-10-CM

## 2011-01-10 DIAGNOSIS — R7309 Other abnormal glucose: Secondary | ICD-10-CM

## 2011-01-10 LAB — CBC WITH DIFFERENTIAL/PLATELET
Basophils Relative: 0.4 % (ref 0.0–3.0)
Eosinophils Absolute: 0.1 10*3/uL (ref 0.0–0.7)
Hemoglobin: 13.2 g/dL (ref 12.0–15.0)
Lymphocytes Relative: 16.7 % (ref 12.0–46.0)
Monocytes Relative: 5.3 % (ref 3.0–12.0)
Neutro Abs: 6.8 10*3/uL (ref 1.4–7.7)
Neutrophils Relative %: 76.9 % (ref 43.0–77.0)
RBC: 4.52 Mil/uL (ref 3.87–5.11)
WBC: 8.9 10*3/uL (ref 4.5–10.5)

## 2011-01-10 LAB — HEMOGLOBIN A1C: Hgb A1c MFr Bld: 6.2 % (ref 4.6–6.5)

## 2011-01-11 HISTORY — PX: OTHER SURGICAL HISTORY: SHX169

## 2011-01-12 ENCOUNTER — Encounter: Payer: Self-pay | Admitting: Internal Medicine

## 2011-01-17 ENCOUNTER — Encounter: Payer: Self-pay | Admitting: Internal Medicine

## 2011-01-17 ENCOUNTER — Ambulatory Visit (INDEPENDENT_AMBULATORY_CARE_PROVIDER_SITE_OTHER): Payer: 59 | Admitting: Internal Medicine

## 2011-01-17 VITALS — BP 120/80 | HR 72 | Wt 227.0 lb

## 2011-01-17 DIAGNOSIS — D649 Anemia, unspecified: Secondary | ICD-10-CM

## 2011-01-17 DIAGNOSIS — R7309 Other abnormal glucose: Secondary | ICD-10-CM

## 2011-01-17 DIAGNOSIS — E669 Obesity, unspecified: Secondary | ICD-10-CM

## 2011-01-17 DIAGNOSIS — D509 Iron deficiency anemia, unspecified: Secondary | ICD-10-CM

## 2011-01-17 NOTE — Patient Instructions (Signed)
Take the iron as much as possible and twice during period and youo should do well. Call for lab appt  Before a yearly visit in February or there about. Dietary changes as you are doing . Call  If needed

## 2011-01-21 ENCOUNTER — Encounter: Payer: Self-pay | Admitting: Internal Medicine

## 2011-01-21 NOTE — Progress Notes (Signed)
  Subjective:    Patient ID: Clance Boll, female    DOB: [birth year], 40 y.o.   MRN: [id number]  HPI Patient comes in today with mom regarding   fu of iron  deficiency and hyperglycemia  .  Since last visit has had surgery on right knee and now on crutches  At [facility] . Cartilage tear  clean out stage 3 arthritis and plica . Thus more difficult to do healthy lifestyle. She is now on continuous for 90 day ocp to decrease her period flow   Review of Systems Neg cv or pulm proglems     Objective:   Physical Exam wdwn in nad  Crutches  right knee healing arthro scar no redness  Reviewed labs        Assessment & Plan:  Iron deficiency  Not anemic now but  ferritin low    Should get better with continuous hormonal therapy  Hyperglycemia and a1c above 6  Prediabetic profile   No sx   Sp knee surgery right plica   DJD  Cartilage tear In rehab  Has good attitude about getting back on track  Counseled.  Intensify lifestyle interventions. And follow up  More than 50% of visit  Was spent in counseling  20 min

## 2011-02-17 ENCOUNTER — Encounter: Payer: Self-pay | Admitting: Internal Medicine

## 2011-03-17 NOTE — Op Note (Signed)
Brandy Maynard, Brandy Maynard               ACCOUNT NO.:  [id number]   MEDICAL RECORD NO.:  [id number]          PATIENT TYPE:  AMB   LOCATION:  DAY                          FACILITY:  [facility]   PHYSICIAN:  Lorre Munroe., M.D.DATE OF BIRTH:  December 28, 1970   DATE OF PROCEDURE:  12/26/2004  DATE OF DISCHARGE:                                 OPERATIVE REPORT   POSTOPERATIVE DIAGNOSIS:  Symptomatic gallstones.   POSTOPERATIVE DIAGNOSIS:  Symptomatic gallstones.   OPERATION:  Laparoscopic cholecystectomy with operative cholangiogram.   SURGEON:  Dr. Orson Slick.   ANESTHESIA:  General.   PROCEDURE:  After the patient was monitored and anesthetized and had routine  preparation and draping of the abdomen, I infused local anesthetic just  below the umbilicus and made a 2 cm transverse incision, dissected down  through the fat until I encountered fascia and incised fascia for about 2 cm  in the midline, then opened the peritoneum bluntly.  I placed a 0 Vicryl  pursestring suture in the fascia and secured a Hassan cannula, then inflated  the abdomen with CO2.  Examining the abdominal contents, I saw no  abnormalities.  There were a few filmy adhesions of omentum to the  undersurface of the gallbladder.  I then anesthetized three additional port  sites and placed three laparoscopic ports under direct vision.  With the  patient positioned head-up, foot down, and tilted to the left, I retracted  the fundus of the gallbladder toward the right shoulder and took down the  adhesions with cautery and by blunt dissection.  I then grasped the  infundibulum of the gallbladder and pulled it laterally and dissected the  hepatoduodenal ligament, making a large window between the liver and the  cystic duct and gallbladder and clearly visualized and identified the cystic  duct and the cystic artery.  I clipped the cystic artery with three clips  and cut it between the two closest to the gallbladder.  I then dissected  the  cystic duct a bit further, clipped it with one clip as it emerged from the  gallbladder, and then made a small nick in it and noted clear bile coming  out.  I inserted a Cook cholangiogram catheter and secured that with a clip  and then performed a fluoroscopic cholangiogram which demonstrated normal  entry of the cystic duct into the common bile duct.  Normal sized ducts,  normal biliary anatomy, free flow into the duodenum, and no evidence of  filling defects.  We then resumed laparoscopy and removed the cholangiogram  catheter and clips and clipped the distal cystic duct with two clips and  divided it.  I then dissected the gallbladder from the liver using cautery  and gaining hemostasis with the cautery.  Very little blood was lost.  After  detaching the gallbladder from the liver, I removed it intact through the  abdominal incision and tied the  pursestring suture.  I then removed the lateral ports under direct vision  and saw no bleeding.  I allowed the CO2 to escape and removed the epigastric  port, then closed all incisions  with intracuticular 4-0 Vicryl and Steri-  Strips.  The patient was sent to PACU in good condition.      WB/MEDQ  D:  12/26/2004  T:  12/26/2004  Job:  [id number]   cc:   Marcene Duos, M.D.  [address]  [place]  [place] [address]  Fax: [phone]

## 2011-03-17 NOTE — H&P (Signed)
Brandy Maynard, Brandy Maynard               ACCOUNT NO.:  [id number]   MEDICAL RECORD NO.:  [id number]           PATIENT TYPE:   LOCATION:                                 FACILITY:   PHYSICIAN:  Juan H. Lily Peer, M.D.     DATE OF BIRTH:   DATE OF ADMISSION:  10/11/2005  DATE OF DISCHARGE:                                HISTORY & PHYSICAL   CHIEF COMPLAINT:  1.  Chronic right lower quadrant pain.  2.  Request elective permanent sterilization.   HISTORY:  The patient is a 40 year old, gravida 3, para 2, AB 1, who is  scheduled to undergo a laparoscopic right salpingo-oophorectomy with left  tubal sterilization procedure secondary to chronic pelvic pain suspicious  for endometriosis.  The patient has had worsening pelvic pain over the past  18 months.  She is on Loestrin 24 oral contraception pill and takes also  Darvocet for her pain.  The patient states that she had had a  cholecystectomy in February of this year, but she had also been followed by  Dr. Luther Parody because of the diarrhea that she was having over the past six  months.  He had done a colonoscopy and thought her syndrome was due to  irritable bowel syndrome.  Her pain is associated with right lower quadrant  pain shooting down to the right lower back, bloating worse before and during  periods.  She says she has pain shortly after intercourse and has two weeks  out of the month where she feels like she is about to have a period although  no bleeding, just the sensation, the bloating, achiness, and pain.  Ultrasound on August 6 in the office demonstrated a small normal right ovary  and no masses seen.  The left ovary was normal and no fluid in the cul-de-  sac.  The patient's mother and sister have had history of endometriosis.  One of the patient's pregnancies was conceived by Clomid. The others were  spontaneous.   The patient would like to proceed with the diagnostic laparoscopy treatment  of underlying endometriosis and  because of the pain is so severe, she would  like to proceed with removal of the right adnexa and sterilization of the  contralateral ovary.   ALLERGIES:  1.  PENICILLIN  2.  VICODIN.  3.  DURATUSS.   PAST MEDICAL/SURGICAL HISTORY:  1.  Knee arthroscopy.  2.  Cholecystectomy.  3.  She also had right ankle ligament repair as well.  4.  History of plantar fasciitis.   MEDICATIONS:  She is on Loestrin 24 oral contraceptive pill and Darvocet  p.r.n.   OB HISTORY:  The patient's two children were delivered vaginally. She had  one miscarriage.   FAMILY HISTORY:  Both grandparents had diabetes.  Maternal grandparents with  hypertension.  Maternal great grandmother with ovarian cancer and maternal  grandparents with cardiovascular disease.   PHYSICAL EXAMINATION:  VITAL SIGNS:  The patient weighs 219 pounds.  She is  5 feet 5 inches tall. Blood pressure 120/78.  HEENT:  Unremarkable.  NECK:  Supple.  Trachea midline.  No carotid bruits or thyromegaly.  LUNGS:  Clear to auscultation without rhonchi or wheezes.  HEART:  Regular rate and rhythm.  No murmurs or gallops.  BREASTS: Unremarkable.  ABDOMEN:  Soft, tender in the right lower quadrant but no rebound.  PELVIC: Bartholin, urethral and Skene glands within normal limits.  Vagina  and cervix have no lesions or discharge.  Ureters anteverted.  Normal size,  shape and consistency.  Tenderness in the right adnexal region.  No rebound  or guarding.  No masses per se.  RECTAL:  Unremarkable.   ASSESSMENT:  A 40 year old, gravida 3, para 2, AB 1 with chronic right lower  quadrant pain over the past 18 months, worse over the past six months.  In  February of this year, the patient had had a laparoscopic cholecystectomy  and she was recently followed up by gastroenterologist who did a colonoscopy  due to her diarrhea and was found to have irritable bowel syndrome.  The  patient with strong family history of endometriosis, her mother and  her  sister.  I suspect that her pain and bloating sensation, painful  intercourse, may all be attributed to underlying endometriosis.  Recent  ultrasound was normal, but nevertheless, you cannot tell unless you see it  directly whether she may have endometriosis.   PLAN:  Proceed with diagnostic laparoscopy and per the patient's request,  removal of the right adnexa regardless of the findings and sterilization of  the contralateral tube since the patient does not want to have any more  children.  The patient also has been provided with literature and  information from the [employer] of OB/GYN on endometriosis and  laparoscopy.  We also discussed the preoperatively the risks and benefits  and pros and cons of the operation to include infection although she will  receive prophylaxis antibiotic, taken in mind that she is allergic to  penicillin and Vicodin.  Also the risk of trauma to internal organs  requiring open laparotomy to correct any potential injury to the bowel,  nerves or blood vessels or other internal organs, bladder or ureter.  In the  event of any technical difficulty, that the procedure would not be completed  laparoscopically and would need to proceed with an open laparotomy  technique.  Also she is fully aware that in the event that there is florid  endometriosis of the entire uterus, tubes and ovaries, that we will just  pull the scope out and proceed with an open laparotomy and proceed with a  tall abdominal hysterectomy with bilateral salpingo-oophorectomy which will  require the patient to be on hormone replacement therapy for a few years.  In the event of any uncontrolled hemorrhage and the patient would receive  blood or blood products, she is fully aware of potential risk from donor  blood to include anaphylactic reaction, hepatitis and AIDS. Finally, the  risk of deep venous thrombosis or pulmonary embolism or even death from surgery were discussed as well.   All of these issues were discussed with the  patient.  All questions were answered and will follow accordingly.   The patient is scheduled for laparoscopic right salpingo-oophorectomy along  with left tubal sterilization procedure, possible laparotomy at [facility] on December 13 at 8:30 a.m.      Juan H. Lily Peer, M.D.  Electronically Signed     JHF/MEDQ  D:  10/10/2005  T:  10/10/2005  Job:  [id number]

## 2011-03-17 NOTE — Op Note (Signed)
NAMEALEXANDRA, Brandy Maynard               ACCOUNT NO.:  [id number]   MEDICAL RECORD NO.:  [id number]          PATIENT TYPE:  INP   LOCATION:  1611                         FACILITY:  [facility]   PHYSICIAN:  Juan H. Lily Peer, M.D.DATE OF BIRTH:  1971-04-13   DATE OF PROCEDURE:  10/11/2005  DATE OF DISCHARGE:                                 OPERATIVE REPORT   SURGEON:  Juan H. Lily Peer, M.D.   FIRST ASSISTANT:  Ivor Costa. Farrel Gobble, M.D.   INDICATIONS FOR OPERATION:  A 40 year old, gravida 3, para 2, AB 1 with  chronic right lower quadrant pain and also requesting elective permanent  sterilization.   PREOPERATIVE DIAGNOSIS:  1.  Chronic right lower quadrant pain.  2.  Request for elective permanent sterilization.   POSTOPERATIVE DIAGNOSIS:  1.  Chronic right lower quadrant pain.  2.  Request for elective permanent sterilization.  3.  Pelvic adhesions and possible right ovarian endometriosis.   PROCEDURE PERFORMED:  1.  Diagnostic laparoscopy.  2.  Laparotomy.  3.  Lysis of pelvic adhesions.  4.  Right salpingo-oophorectomy.  5.  Cauterization and transection of left fallopian tube.   DESCRIPTION OF OPERATION:  After the patient was adequately counseled, she  was taken to the operating room where she underwent a successful general  endotracheal anesthesia. She had received a gram of Cefotan for prophylaxis  preoperatively and had PSA stockings for DVT prophylaxis. Her legs were  placed in low lithotomy position but prior to this, a bimanual examination  demonstrated the uterus to be anteverted with no palpable adnexal mass and a  Hulka tenaculum was placed for manipulation during the laparoscopic  procedure. Also a Foley catheter had been inserted in an effort to monitor  urinary output. A small semilunar incision was made underneath the umbilicus  underneath the previous cholecystectomy laparoscopic port scar. The incision  was taken down to the skin, subcutaneous tissue down to the  fascia. A Veress  needle was introduced into the peritoneal cavity, opening intra-abdominal  pressure was recorded at 5 mmHg and approximately 2.5 liters of carbon  dioxide were insufflated into the peritoneal cavity. A 10/11 mm trocar was  introduced into the peritoneal cavity. The sheath was left in place, the  laparoscope was inserted. The patient was further placed in Trendelenburg  position. Visualization of the uterus and right tube were possible but  somewhat restricted secondary to extensive omentum and adipose tissue and  scar which made it difficult to visualize the pelvic cavity. At this point,  it was decided to remove the laparoscope and the trocar and proceed with an  open laparotomy.  The patient was redraped and a Pfannenstiel skin incision  was made 2 cm above the symphysis pubis. The incision was carried down from  the skin, subcutaneous tissue and down to the rectus fascia whereby a  midline nick was made. The fascia was incised in a transverse fashion. The  peritoneal cavity was entered cautiously. The O'Connor-O'Sullivan retractor  was utilized for exposure. The patient had pelvic adhesions. Posterior to  the uterus and near the sigmoid colon which were meticulously freed,  inspection of the left tube and ovary appeared to be normal after was  released from the adhesions. The proximal one-third portion of fallopian  tube was cauterized and transected to completed sterilization on the left  side. The right tube and ovary were inspected and it appears to be there may  be a small endometriotic implant on the surface of the ovary, but there was  no endometriosis in the cul-de-sac, anterior or posterior or any other  peritoneal surface. The right ureter was identified. The right  infundibulopelvic ligament was identified and was grasped, transected and  free tied with #0 Vicryl suture followed by a transfixation stitch and the  remaining right tube and ovary was excised and  the mesosalpinx was closed  with a running stitch of 3-0 Vicryl suture. The pelvic cavity was then  copiously irrigated with normal saline solution. Sponge count and needle  count were correct. The O'Connor-O'Sullivan retractor was removed and the  visceroperitoneum was not reapproximated but the rectus fascia was closed  with a running stitch of #0 Vicryl suture. The subcutaneous fat was  reapproximated with a running 3-0 Vicryl suture. The skin was reapproximated  with staples. The subumbilical incision the fascia was closed when the  laparotomy was made from underneath with #0 Vicryl suture and the skin was  then reapproximated with 4-0 plain catgut interrupted sutures. For  postoperative analgesia a 0.25% Marcaine was infiltrated in both incisions  sites for a total of 20 mL. The Hulka tenaculum was removed. There was some  slight bleeding on the anterior cervical lip from the tenaculum which was  contained with silver nitrate. The patient was extubated and transferred to  the recovery room with stable vital signs. Blood loss from the procedure was  100 mL. IV fluids consisted of 1500 mL of lactated Ringer's and urine output  was 100 mL.      Juan H. Lily Peer, M.D.  Electronically Signed     JHF/MEDQ  D:  10/11/2005  T:  10/11/2005  Job:  [id number]

## 2011-03-17 NOTE — Discharge Summary (Signed)
Brandy Maynard, PASSARELLI               ACCOUNT NO.:  [id number]   MEDICAL RECORD NO.:  [id number]          PATIENT TYPE:  INP   LOCATION:  1611                         FACILITY:  [facility]   PHYSICIAN:  Juan H. Lily Peer, M.D.DATE OF BIRTH:  1971/07/12   DATE OF ADMISSION:  10/11/2005  DATE OF DISCHARGE:  10/13/2005                                 DISCHARGE SUMMARY   TOTAL DAYS HOSPITALIZED:  Two.   HISTORY:  The patient is a 40 year old, gravida 3, para 2, AB 1 who was  admitted on the morning of December 13 for a planned laparoscopic procedure  secondary to chronic right lower quadrant pain. The patient requested  elective permanent sterilization. The patient underwent a diagnostic  laparoscopy due to extensive adhesions and omentum making it difficult to  proceed with the case laparoscopically. Proceeded to have an open laparotomy  with lysis of adhesions, right salpingo-oophorectomy and left cauterization  with transection of fallopian tubes. Pathology report pending at the time of  this dictation. The patient postoperatively Tmax was 100.1 and temperature  averaging about 97 degrees. Her urine output the first 8 hours was 8000 mL,  she had not passed any flatus at that point, her lungs were clear. The  incision was intact, she had good bowel sounds. Her Foley catheter was  discontinued. She was started on clear liquids and her PCA pump was  discontinued as well and she was gradually advanced to a regular diet. This  morning felt a little bloated and did not pass any gas but had excellent  bowel sounds. Her abdomen was soft and nontender, incision was intact and  she was afebrile with stable vital signs. She was to receive a Dulcolax  suppository this morning along with a bottle of mag citrate with possible  discharge this afternoon. She had been up and ambulating taking a shower as  well and appeared to be ready to be discharged.   PROCEDURE:  1.  Diagnostic laparoscopy.  2.   Laparotomy.  3.  Lysis of pelvic adhesions.  4.  Right salpingo-oophorectomy.  5.  Cauterization and transection of left fallopian tube.   FINAL DISPOSITION AND FOLLOWUP:  The patient was reach to be discharged home  on her second postoperative day and she will return back to the office  within 48-72 hours to have her staples removed. She was given a prescription  for Lortab 7.5/500 to take 1 p.o. q.4-6 h p.r.n. pain. Also Reglan 10 mg to  take 1 p.o. q.4-6 h p.r.n. nausea and Ambien 10 mg p.o. h.s. p.r.n. insomnia  which has worked well for her while in the hospital. She was given  instructions of do's and don't's. Her postop hemoglobin and hematocrit were  11.2 and 32.5 respectively.      Juan H. Lily Peer, M.D.  Electronically Signed     JHF/MEDQ  D:  10/13/2005  T:  10/16/2005  Job:  [id number]

## 2012-02-01 ENCOUNTER — Ambulatory Visit (INDEPENDENT_AMBULATORY_CARE_PROVIDER_SITE_OTHER): Payer: 59 | Admitting: Gynecology

## 2012-02-01 ENCOUNTER — Encounter: Payer: Self-pay | Admitting: Gynecology

## 2012-02-01 VITALS — BP 120/86 | Ht 65.0 in | Wt 231.0 lb

## 2012-02-01 DIAGNOSIS — F419 Anxiety disorder, unspecified: Secondary | ICD-10-CM | POA: Insufficient documentation

## 2012-02-01 DIAGNOSIS — R635 Abnormal weight gain: Secondary | ICD-10-CM

## 2012-02-01 DIAGNOSIS — I10 Essential (primary) hypertension: Secondary | ICD-10-CM

## 2012-02-01 DIAGNOSIS — F411 Generalized anxiety disorder: Secondary | ICD-10-CM

## 2012-02-01 DIAGNOSIS — R600 Localized edema: Secondary | ICD-10-CM | POA: Insufficient documentation

## 2012-02-01 DIAGNOSIS — R609 Edema, unspecified: Secondary | ICD-10-CM

## 2012-02-01 DIAGNOSIS — N920 Excessive and frequent menstruation with regular cycle: Secondary | ICD-10-CM

## 2012-02-01 DIAGNOSIS — N946 Dysmenorrhea, unspecified: Secondary | ICD-10-CM

## 2012-02-01 DIAGNOSIS — Z01419 Encounter for gynecological examination (general) (routine) without abnormal findings: Secondary | ICD-10-CM

## 2012-02-01 LAB — COMPREHENSIVE METABOLIC PANEL
ALT: 17 U/L (ref 0–35)
AST: 20 U/L (ref 0–37)
CO2: 24 mEq/L (ref 19–32)
Calcium: 9 mg/dL (ref 8.4–10.5)
Chloride: 104 mEq/L (ref 96–112)
Creat: 0.82 mg/dL (ref 0.50–1.10)
Sodium: 138 mEq/L (ref 135–145)
Total Bilirubin: 0.4 mg/dL (ref 0.3–1.2)
Total Protein: 7 g/dL (ref 6.0–8.3)

## 2012-02-01 LAB — CBC WITH DIFFERENTIAL/PLATELET
Basophils Absolute: 0 10*3/uL (ref 0.0–0.1)
Basophils Relative: 0 % (ref 0–1)
Eosinophils Absolute: 0.2 10*3/uL (ref 0.0–0.7)
HCT: 41.9 % (ref 36.0–46.0)
Hemoglobin: 13.5 g/dL (ref 12.0–15.0)
MCH: 28.2 pg (ref 26.0–34.0)
MCHC: 32.2 g/dL (ref 30.0–36.0)
Monocytes Absolute: 0.6 10*3/uL (ref 0.1–1.0)
Monocytes Relative: 8 % (ref 3–12)
Neutro Abs: 5.3 10*3/uL (ref 1.7–7.7)
RDW: 13.8 % (ref 11.5–15.5)

## 2012-02-01 LAB — LIPID PANEL
LDL Cholesterol: 102 mg/dL — ABNORMAL HIGH (ref 0–99)
VLDL: 26 mg/dL (ref 0–40)

## 2012-02-01 LAB — TSH: TSH: 2.326 u[IU]/mL (ref 0.350–4.500)

## 2012-02-01 MED ORDER — NORETHIN ACE-ETH ESTRAD-FE 1-20 MG-MCG(24) PO TABS: 1.0000 | ORAL_TABLET | Freq: Every day | ORAL | Status: DC

## 2012-02-01 MED ORDER — SPIRONOLACTONE 25 MG PO TABS: 25.0000 mg | ORAL_TABLET | Freq: Every day | ORAL | Status: DC

## 2012-02-01 MED ORDER — ALPRAZOLAM 0.25 MG PO TABS: 0.2500 mg | ORAL_TABLET | Freq: Every evening | ORAL | Status: AC | PRN

## 2012-02-01 NOTE — Patient Instructions (Signed)
Exercise to Lose Weight Exercise and a healthy diet may help you lose weight. Your doctor may suggest specific exercises. EXERCISE IDEAS AND TIPS  Choose low-cost things you enjoy doing, such as walking, bicycling, or exercising to workout videos.   Take stairs instead of the elevator.   Walk during your lunch break.   Park your car further away from work or school.   Go to a gym or an exercise class.   Start with 5 to 10 minutes of exercise each day. Build up to 30 minutes of exercise 4 to 6 days a week.   Wear shoes with good support and comfortable clothes.   Stretch before and after working out.   Work out until you breathe harder and your heart beats faster.   Drink extra water when you exercise.   Do not do so much that you hurt yourself, feel dizzy, or get very short of breath.  Exercises that burn about 150 calories:  Running 1  miles in 15 minutes.   Playing volleyball for 45 to 60 minutes.   Washing and waxing a car for 45 to 60 minutes.   Playing touch football for 45 minutes.   Walking 1  miles in 35 minutes.   Pushing a stroller 1  miles in 30 minutes.   Playing basketball for 30 minutes.   Raking leaves for 30 minutes.   Bicycling 5 miles in 30 minutes.   Walking 2 miles in 30 minutes.   Dancing for 30 minutes.   Shoveling snow for 15 minutes.   Swimming laps for 20 minutes.   Walking up stairs for 15 minutes.   Bicycling 4 miles in 15 minutes.   Gardening for 30 to 45 minutes.   Jumping rope for 15 minutes.   Washing windows or floors for 45 to 60 minutes.  Document Released: 11/18/2010 Document Revised: 06/28/2011 Document Reviewed: 11/18/2010 [employer] Patient Information 2012 [employer].                                                  Cholesterol Control Diet  Cholesterol levels in your body are determined significantly by your diet. Cholesterol levels may also be related to heart disease. The following material helps to  explain this relationship and discusses what you can do to help keep your heart healthy. Not all cholesterol is bad. Low-density lipoprotein (LDL) cholesterol is the "bad" cholesterol. It may cause fatty deposits to build up inside your arteries. High-density lipoprotein (HDL) cholesterol is "good." It helps to remove the "bad" LDL cholesterol from your blood. Cholesterol is a very important risk factor for heart disease. Other risk factors are high blood pressure, smoking, stress, heredity, and weight. The heart muscle gets its supply of blood through the coronary arteries. If your LDL cholesterol is high and your HDL cholesterol is low, you are at risk for having fatty deposits build up in your coronary arteries. This leaves less room through which blood can flow. Without sufficient blood and oxygen, the heart muscle cannot function properly and you may feel chest pains (angina pectoris). When a coronary artery closes up entirely, a part of the heart muscle may die, causing a heart attack (myocardial infarction). CHECKING CHOLESTEROL When your caregiver sends your blood to a lab to be analyzed for cholesterol, a complete lipid (fat) profile may be done. With   this test, the total amount of cholesterol and levels of LDL and HDL are determined. Triglycerides are a type of fat that circulates in the blood and can also be used to determine heart disease risk. The list below describes what the numbers should be: Test: Total Cholesterol.  Less than 200 mg/dl.  Test: LDL "bad cholesterol."  Less than 100 mg/dl.   Less than 70 mg/dl if you are at very high risk of a heart attack or sudden cardiac death.  Test: HDL "good cholesterol."  Greater than 50 mg/dl for women.   Greater than 40 mg/dl for men.  Test: Triglycerides.  Less than 150 mg/dl.  CONTROLLING CHOLESTEROL WITH DIET Although exercise and lifestyle factors are important, your diet is key. That is because certain foods are known to raise  cholesterol and others to lower it. The goal is to balance foods for their effect on cholesterol and more importantly, to replace saturated and trans fat with other types of fat, such as monounsaturated fat, polyunsaturated fat, and omega-3 fatty acids. On average, a person should consume no more than 15 to 17 g of saturated fat daily. Saturated and trans fats are considered "bad" fats, and they will raise LDL cholesterol. Saturated fats are primarily found in animal products such as meats, butter, and cream. However, that does not mean you need to sacrifice all your favorite foods. Today, there are good tasting, low-fat, low-cholesterol substitutes for most of the things you like to eat. Choose low-fat or nonfat alternatives. Choose round or loin cuts of red meat, since these types of cuts are lowest in fat and cholesterol. Chicken (without the skin), fish, veal, and ground turkey breast are excellent choices. Eliminate fatty meats, such as hot dogs and salami. Even shellfish have little or no saturated fat. Have a 3 oz (85 g) portion when you eat lean meat, poultry, or fish. Trans fats are also called "partially hydrogenated oils." They are oils that have been scientifically manipulated so that they are solid at room temperature resulting in a longer shelf life and improved taste and texture of foods in which they are added. Trans fats are found in stick margarine, some tub margarines, cookies, crackers, and baked goods.  When baking and cooking, oils are an excellent substitute for butter. The monounsaturated oils are especially beneficial since it is believed they lower LDL and raise HDL. The oils you should avoid entirely are saturated tropical oils, such as coconut and palm.  Remember to eat liberally from food groups that are naturally free of saturated and trans fat, including fish, fruit, vegetables, beans, grains (barley, rice, couscous, bulgur wheat), and pasta (without cream sauces).  IDENTIFYING  FOODS THAT LOWER CHOLESTEROL  Soluble fiber may lower your cholesterol. This type of fiber is found in fruits such as apples, vegetables such as broccoli, potatoes, and carrots, legumes such as beans, peas, and lentils, and grains such as barley. Foods fortified with plant sterols (phytosterol) may also lower cholesterol. You should eat at least 2 g per day of these foods for a cholesterol lowering effect.  Read package labels to identify low-saturated fats, trans fats free, and low-fat foods at the supermarket. Select cheeses that have only 2 to 3 g saturated fat per ounce. Use a heart-healthy tub margarine that is free of trans fats or partially hydrogenated oil. When buying baked goods (cookies, crackers), avoid partially hydrogenated oils. Breads and muffins should be made from whole grains (whole-wheat or whole oat flour, instead of "flour" or "  enriched flour"). Buy non-creamy canned soups with reduced salt and no added fats.  FOOD PREPARATION TECHNIQUES  Never deep-fry. If you must fry, either stir-fry, which uses very little fat, or use non-stick cooking sprays. When possible, broil, bake, or roast meats, and steam vegetables. Instead of dressing vegetables with butter or margarine, use lemon and herbs, applesauce and cinnamon (for squash and sweet potatoes), nonfat yogurt, salsa, and low-fat dressings for salads.  LOW-SATURATED FAT / LOW-FAT FOOD SUBSTITUTES Meats / Saturated Fat (g)  Avoid: Steak, marbled (3 oz/85 g) / 11 g   Choose: Steak, lean (3 oz/85 g) / 4 g   Avoid: Hamburger (3 oz/85 g) / 7 g   Choose: Hamburger, lean (3 oz/85 g) / 5 g   Avoid: Ham (3 oz/85 g) / 6 g   Choose: Ham, lean cut (3 oz/85 g) / 2.4 g   Avoid: Chicken, with skin, dark meat (3 oz/85 g) / 4 g   Choose: Chicken, skin removed, dark meat (3 oz/85 g) / 2 g   Avoid: Chicken, with skin, light meat (3 oz/85 g) / 2.5 g   Choose: Chicken, skin removed, light meat (3 oz/85 g) / 1 g  Dairy / Saturated Fat  (g)  Avoid: Whole milk (1 cup) / 5 g   Choose: Low-fat milk, 2% (1 cup) / 3 g   Choose: Low-fat milk, 1% (1 cup) / 1.5 g   Choose: Skim milk (1 cup) / 0.3 g   Avoid: Hard cheese (1 oz/28 g) / 6 g   Choose: Skim milk cheese (1 oz/28 g) / 2 to 3 g   Avoid: Cottage cheese, 4% fat (1 cup) / 6.5 g   Choose: Low-fat cottage cheese, 1% fat (1 cup) / 1.5 g   Avoid: Ice cream (1 cup) / 9 g   Choose: Sherbet (1 cup) / 2.5 g   Choose: Nonfat frozen yogurt (1 cup) / 0.3 g   Choose: Frozen fruit bar / trace   Avoid: Whipped cream (1 tbs) / 3.5 g   Choose: Nondairy whipped topping (1 tbs) / 1 g  Condiments / Saturated Fat (g)  Avoid: Mayonnaise (1 tbs) / 2 g   Choose: Low-fat mayonnaise (1 tbs) / 1 g   Avoid: Butter (1 tbs) / 7 g   Choose: Extra light margarine (1 tbs) / 1 g   Avoid: Coconut oil (1 tbs) / 11.8 g   Choose: Olive oil (1 tbs) / 1.8 g   Choose: Corn oil (1 tbs) / 1.7 g   Choose: Safflower oil (1 tbs) / 1.2 g   Choose: Sunflower oil (1 tbs) / 1.4 g   Choose: Soybean oil (1 tbs) / 2.4 g   Choose: Canola oil (1 tbs) / 1 g  Document Released: 10/16/2005 Document Revised: 06/28/2011 Document Reviewed: 04/06/2011 [employer] Patient Information 2012 [employer].   

## 2012-02-01 NOTE — Progress Notes (Signed)
Brandy Maynard [birth year] [id number]   History:    41 y.o.  for annual exam with complaint of tiredness fatigue and weight gain and fluid retention. Her primary physician is Dr. Fabian Sharp we she scheduled to see them next few weeks. Patient has has done well with Aldactone 25 mg and is interested in her refill. She also has had history of iron deficiency anemia the Dr. Fabian Sharp was evaluated and treated in the past. Patient has been taking her iron tablets on and off and not on a regular basis until these past 2 weeks. Patient's had a previous tubal ligation procedure. As a result of her dysmenorrhea and menorrhagia she has been on low dose oral contraceptive pill such as Loestrin 120. She also does have a history of a right salpingo-oophorectomy for benign cyst. She does have normal Pap smears her last one was in 2012. She frequently does her breast exams and is scheduled for mammogram next week.  Past medical history,surgical history, family history and social history were all reviewed and documented in the EPIC chart.  Gynecologic History Patient's last menstrual period was 12/28/2011. Contraception: OCP (estrogen/progesterone) and tubal ligation Last Pap: 2012. Results were: normal Last mammogram: 2012. Results were: normal  Obstetric History OB History    Grav Para Term Preterm Abortions TAB SAB Ect Mult Living   3 2 2  1  1   2      # Outc Date GA Lbr Len/2nd Wgt Sex Del Anes PTL Lv   1 TRM     F SVD  No Yes   2 TRM     F SVD  No Yes   3 SAB                ROS:  Was performed and pertinent positives and negatives are included in the history.  Exam: chaperone present  BP 120/86  Ht 5\' 5"  (1.651 m)  Wt 231 lb (104.781 kg)  BMI 38.44 kg/m2  LMP 12/28/2011  Body mass index is 38.44 kg/(m^2).  General appearance : Well developed well nourished female. No acute distress HEENT: Neck supple, trachea midline, no carotid bruits, no thyroidmegaly Lungs: Clear to auscultation, no rhonchi  or wheezes, or rib retractions  Heart: Regular rate and rhythm, no murmurs or gallops Breast:Examined in sitting and supine position were symmetrical in appearance, no palpable masses or tenderness,  no skin retraction, no nipple inversion, no nipple discharge, no skin discoloration, no axillary or supraclavicular lymphadenopathy Abdomen: no palpable masses or tenderness, no rebound or guarding Extremities: no edema or skin discoloration or tenderness  Pelvic:  Bartholin, Urethra, Skene Glands: Within normal limits             Vagina: No gross lesions or discharge  Cervix: No gross lesions or discharge  Uterus  anteverted, normal size, shape and consistency, non-tender and mobile  Adnexa  Without masses or tenderness  Anus and perineum  normal   Rectovaginal  normal sphincter tone without palpated masses or tenderness             Hemoccult not done     Assessment/Plan:  41 y.o. female for annual exam with increased weight gain tiredness and fatigue. Patient is also going through emotional situations since she's getting ready to send off her daughter to [place] and has had issues with anxiety. We discussed about placing her on a low dose and tying site a medication such as Xanax 0.25 mg to take 1 by mouth daily when necessary.  Her blood pressure today was 120/86. She is to suffer from fluid retention before her menses and now she states it occurs during most of the month. We're going to place her back on the potassium sparing diuretic (Aldactone 25 mg). She will take half a tablet first week then a full tablet thereafter. She will followup with her internist in a few weeks and her blood pressure to be rechecked at that point as well. We discussed importance of regular exercise and appropriate nutrition for which literature information was provided. We did discuss alternative methods for her dysmenorrhea and menorrhagia instead of the low dose oral contraceptive pill to include office endometrial  ablation or placement of a Mirena IUD. Literature information was provided. The following lab work we drawn today: Comprehensive metabolic panel, TSH, CBC, urinalysis, and fasting lipid profile. We discussed the new screening guidelines her Pap smears to be every 3 years. A Pap smear was not done today next one due in 2 years. She was also encouraged to continue to do her monthly self breast examination.    Ok Edwards MD, 11:18 AM 02/01/2012

## 2012-02-02 ENCOUNTER — Other Ambulatory Visit: Payer: Self-pay

## 2012-02-02 DIAGNOSIS — R7309 Other abnormal glucose: Secondary | ICD-10-CM

## 2012-02-02 DIAGNOSIS — R3129 Other microscopic hematuria: Secondary | ICD-10-CM

## 2012-02-02 DIAGNOSIS — R748 Abnormal levels of other serum enzymes: Secondary | ICD-10-CM

## 2012-02-02 LAB — URINALYSIS W MICROSCOPIC + REFLEX CULTURE
Bacteria, UA: NONE SEEN
Casts: NONE SEEN
Leukocytes, UA: NEGATIVE
Nitrite: NEGATIVE
Specific Gravity, Urine: 1.027 (ref 1.005–1.030)
Squamous Epithelial / LPF: NONE SEEN
Urobilinogen, UA: 0.2 mg/dL (ref 0.0–1.0)
pH: 6 (ref 5.0–8.0)

## 2012-02-05 ENCOUNTER — Other Ambulatory Visit: Payer: Self-pay | Admitting: Gynecology

## 2012-02-05 ENCOUNTER — Other Ambulatory Visit: Payer: 59

## 2012-02-05 DIAGNOSIS — R3129 Other microscopic hematuria: Secondary | ICD-10-CM

## 2012-02-05 DIAGNOSIS — D649 Anemia, unspecified: Secondary | ICD-10-CM

## 2012-02-05 DIAGNOSIS — E669 Obesity, unspecified: Secondary | ICD-10-CM

## 2012-02-05 DIAGNOSIS — D509 Iron deficiency anemia, unspecified: Secondary | ICD-10-CM

## 2012-02-05 DIAGNOSIS — R748 Abnormal levels of other serum enzymes: Secondary | ICD-10-CM

## 2012-02-05 DIAGNOSIS — R7309 Other abnormal glucose: Secondary | ICD-10-CM

## 2012-02-05 LAB — COMPREHENSIVE METABOLIC PANEL
ALT: 13 U/L (ref 0–35)
AST: 14 U/L (ref 0–37)
Albumin: 3.9 g/dL (ref 3.5–5.2)
Alkaline Phosphatase: 113 U/L (ref 39–117)
Potassium: 4.6 mEq/L (ref 3.5–5.3)
Sodium: 139 mEq/L (ref 135–145)
Total Protein: 6.6 g/dL (ref 6.0–8.3)

## 2012-02-06 ENCOUNTER — Encounter: Payer: Self-pay | Admitting: Gynecology

## 2012-02-06 LAB — HEMOGLOBIN A1C
Hgb A1c MFr Bld: 6.3 % — ABNORMAL HIGH (ref <5.7)
Mean Plasma Glucose: 134 mg/dL — ABNORMAL HIGH (ref <117)

## 2012-02-06 LAB — LIPID PANEL
Cholesterol: 160 mg/dL (ref 0–200)
HDL: 51 mg/dL (ref >39)
Triglycerides: 109 mg/dL (ref <150)

## 2012-02-06 LAB — COMPREHENSIVE METABOLIC PANEL
Albumin: 3.9 g/dL (ref 3.5–5.2)
BUN: 16 mg/dL (ref 6–23)
CO2: 26 mEq/L (ref 19–32)
Calcium: 8.8 mg/dL (ref 8.4–10.5)
Chloride: 108 mEq/L (ref 96–112)
Creat: 0.78 mg/dL (ref 0.50–1.10)
Glucose, Bld: 110 mg/dL — ABNORMAL HIGH (ref 70–99)
Potassium: 4.5 mEq/L (ref 3.5–5.3)

## 2012-02-06 LAB — URINALYSIS W MICROSCOPIC + REFLEX CULTURE
Bacteria, UA: NONE SEEN
Bilirubin Urine: NEGATIVE
Crystals: NONE SEEN
Ketones, ur: NEGATIVE mg/dL
Protein, ur: NEGATIVE mg/dL
Urobilinogen, UA: 0.2 mg/dL (ref 0.0–1.0)

## 2012-02-06 LAB — BILIRUBIN,DIRECT & INDIRECT (FRACTIONATED): Bilirubin, Direct: 0.1 mg/dL (ref 0.0–0.3)

## 2012-04-02 ENCOUNTER — Telehealth: Payer: Self-pay | Admitting: Family Medicine

## 2012-04-02 NOTE — Telephone Encounter (Signed)
This pt is going to [facility] 6/21 for CPX labs. Can you please put the orders in? Thanks.

## 2012-04-03 NOTE — Telephone Encounter (Signed)
Please advise if you would like additional labs such as ferritin.

## 2012-04-03 NOTE — Telephone Encounter (Signed)
Apparently had some labs with Dr Lily Peer  Advise fasting BMP hg a1c cbcdiff ferritin. lfts and  Gamma GGT.    Dx v70 and hyperglycemia and abnormal lfts.

## 2012-04-05 ENCOUNTER — Other Ambulatory Visit: Payer: Self-pay | Admitting: Internal Medicine

## 2012-04-05 DIAGNOSIS — R945 Abnormal results of liver function studies: Secondary | ICD-10-CM

## 2012-04-05 DIAGNOSIS — Z0000 Encounter for general adult medical examination without abnormal findings: Secondary | ICD-10-CM

## 2012-04-05 DIAGNOSIS — R7989 Other specified abnormal findings of blood chemistry: Secondary | ICD-10-CM

## 2012-04-05 DIAGNOSIS — R739 Hyperglycemia, unspecified: Secondary | ICD-10-CM

## 2012-04-05 NOTE — Telephone Encounter (Signed)
Labs have been ordered

## 2012-04-19 ENCOUNTER — Other Ambulatory Visit (INDEPENDENT_AMBULATORY_CARE_PROVIDER_SITE_OTHER): Payer: 59

## 2012-04-19 DIAGNOSIS — R739 Hyperglycemia, unspecified: Secondary | ICD-10-CM

## 2012-04-19 DIAGNOSIS — R7989 Other specified abnormal findings of blood chemistry: Secondary | ICD-10-CM

## 2012-04-19 DIAGNOSIS — R945 Abnormal results of liver function studies: Secondary | ICD-10-CM

## 2012-04-19 DIAGNOSIS — Z0000 Encounter for general adult medical examination without abnormal findings: Secondary | ICD-10-CM

## 2012-04-19 DIAGNOSIS — R7309 Other abnormal glucose: Secondary | ICD-10-CM

## 2012-04-19 LAB — CBC WITH DIFFERENTIAL/PLATELET
Basophils Relative: 0.4 % (ref 0.0–3.0)
Eosinophils Absolute: 0.1 10*3/uL (ref 0.0–0.7)
HCT: 35.3 % — ABNORMAL LOW (ref 36.0–46.0)
Hemoglobin: 11.7 g/dL — ABNORMAL LOW (ref 12.0–15.0)
Lymphs Abs: 1.9 10*3/uL (ref 0.7–4.0)
MCHC: 33.2 g/dL (ref 30.0–36.0)
MCV: 84 fl (ref 78.0–100.0)
Monocytes Absolute: 0.6 10*3/uL (ref 0.1–1.0)
Neutro Abs: 5.4 10*3/uL (ref 1.4–7.7)
RBC: 4.2 Mil/uL (ref 3.87–5.11)
RDW: 13.8 % (ref 11.5–14.6)

## 2012-04-19 LAB — HEPATIC FUNCTION PANEL
ALT: 13 U/L (ref 0–35)
Albumin: 3.4 g/dL — ABNORMAL LOW (ref 3.5–5.2)
Bilirubin, Direct: 0.1 mg/dL (ref 0.0–0.3)
Total Protein: 6.8 g/dL (ref 6.0–8.3)

## 2012-04-19 LAB — BASIC METABOLIC PANEL
BUN: 13 mg/dL (ref 6–23)
CO2: 28 mEq/L (ref 19–32)
Glucose, Bld: 107 mg/dL — ABNORMAL HIGH (ref 70–99)
Potassium: 4.1 mEq/L (ref 3.5–5.1)
Sodium: 138 mEq/L (ref 135–145)

## 2012-04-19 LAB — GAMMA GT: GGT: 16 U/L (ref 7–51)

## 2012-04-22 ENCOUNTER — Other Ambulatory Visit: Payer: 59

## 2012-04-29 ENCOUNTER — Ambulatory Visit (INDEPENDENT_AMBULATORY_CARE_PROVIDER_SITE_OTHER): Payer: 59 | Admitting: Internal Medicine

## 2012-04-29 ENCOUNTER — Encounter: Payer: Self-pay | Admitting: Internal Medicine

## 2012-04-29 VITALS — BP 120/86 | HR 104 | Temp 98.3°F | Ht 65.0 in | Wt 229.0 lb

## 2012-04-29 DIAGNOSIS — R030 Elevated blood-pressure reading, without diagnosis of hypertension: Secondary | ICD-10-CM

## 2012-04-29 DIAGNOSIS — J309 Allergic rhinitis, unspecified: Secondary | ICD-10-CM

## 2012-04-29 DIAGNOSIS — R7309 Other abnormal glucose: Secondary | ICD-10-CM

## 2012-04-29 DIAGNOSIS — Z23 Encounter for immunization: Secondary | ICD-10-CM

## 2012-04-29 DIAGNOSIS — D649 Anemia, unspecified: Secondary | ICD-10-CM

## 2012-04-29 DIAGNOSIS — D509 Iron deficiency anemia, unspecified: Secondary | ICD-10-CM

## 2012-04-29 DIAGNOSIS — E669 Obesity, unspecified: Secondary | ICD-10-CM

## 2012-04-29 DIAGNOSIS — Z0000 Encounter for general adult medical examination without abnormal findings: Secondary | ICD-10-CM

## 2012-04-29 DIAGNOSIS — R739 Hyperglycemia, unspecified: Secondary | ICD-10-CM

## 2012-04-29 DIAGNOSIS — IMO0001 Reserved for inherently not codable concepts without codable children: Secondary | ICD-10-CM

## 2012-04-29 MED ORDER — FLUTICASONE PROPIONATE 50 MCG/ACT NA SUSP: 2.0000 | Freq: Every day | NASAL | Status: DC

## 2012-04-29 NOTE — Patient Instructions (Addendum)
Get a blood pressure monitor  Large cuff   And check readings about 3 x per week. bp 140/90 and above is elevated . Bring machine and readings  to next visit  Will refer to nutrition regarding the  Elevated sugars . Prediabetes. Bp etc.  Sleep deprivation is associated with weight gain and difficulty losing weight Take iron supplement to catch up if donating blood.  Check hg a1c and cbcdiff  Ferritin  in 4 months and then visit .   consider adding metformin also if  Sugars not controlled.  Healthy weight loss owrks better than any medication to prevent diabetes .

## 2012-05-02 DIAGNOSIS — Z0000 Encounter for general adult medical examination without abnormal findings: Secondary | ICD-10-CM | POA: Insufficient documentation

## 2012-05-02 DIAGNOSIS — IMO0001 Reserved for inherently not codable concepts without codable children: Secondary | ICD-10-CM | POA: Insufficient documentation

## 2012-05-02 NOTE — Progress Notes (Signed)
Subjective:    Patient ID: Brandy Maynard, female    DOB: May 01, 1971, 41 y.o.   MRN: [id number]  HPI Patient comes in today for preventive visit and follow-up of medical issues. Update  history since  last visit: Had ear pain previousely see above was given z pack and ear drops for ? OE  and seems better but needs check hearing ok. Would like me to rx flonase prev rx by  Dr Annalee Genta for her allergy sinus problem doing well without need for other intervention at this time.please recheck. Donated Blood in MAY.  BP was elevated at [facility]ne and other readings in [facility]. No hx of HT .  GERD  ocass med   Review of Systems ROS:  GEN/ HEENT: No fever, significant weight changes sweats headaches vision problems hearing changes, CV/ PULM; No chest pain shortness of breath cough, syncope,edema  change in exercise tolerance. GI /GU: No adominal pain, vomiting, change in bowel habits. No blood in the stool. No significant GU symptoms. SKIN/HEME: ,no acute skin rashes suspicious lesions or bleeding. No lymphadenopathy, nodules, masses.  NEURO/ PSYCH:  No neurologic signs such as weakness numbness. No depression anxiety. IMM/ Allergy: No unusual infections.  Allergy .   REST of 12 system review negative except as per HPI  Outpatient Encounter Prescriptions as of 04/29/2012  Medication Sig Dispense Refill  . acetaminophen (TYLENOL) 325 MG tablet Take 650 mg by mouth every 6 (six) hours as needed.        . cetirizine (ZYRTEC) 10 MG tablet Take 10 mg by mouth daily.      . fluticasone (FLONASE) 50 MCG/ACT nasal spray Place 2 sprays into the nose daily.  16 g  6  . Norethindrone Acetate-Ethinyl Estrad-FE (LOESTRIN 24 FE) 1-20 MG-MCG(24) tablet Take 1 tablet by mouth daily.  1 Package  11  . omeprazole (PRILOSEC OTC) 20 MG tablet Take 20 mg by mouth daily.        Marland Kitchen spironolactone (ALDACTONE) 25 MG tablet Take 1 tablet (25 mg total) by mouth daily.  30 tablet  11  . DISCONTD: celecoxib (CELEBREX) 200 MG capsule  Take 200 mg by mouth 2 (two) times daily.        Marland Kitchen DISCONTD: ferrous sulfate dried (SLOW FE) 160 (50 FE) MG TBCR Take 160 mg by mouth daily.        Marland Kitchen DISCONTD: fluticasone (FLONASE) 50 MCG/ACT nasal spray 2 sprays by Nasal route daily.        Marland Kitchen DISCONTD: HYDROcodone-acetaminophen (NORCO) 5-325 MG per tablet Take 1 tablet by mouth every 6 (six) hours as needed.             Objective:   Physical Exam BP 120/86  Pulse 104  Temp 98.3 F (36.8 C) (Oral)  Ht 5\' 5"  (1.651 m)  Wt 229 lb (103.874 kg)  BMI 38.11 kg/m2  SpO2 98%  LMP 04/08/2012 Repeat bp sitting right 124/84  Physical Exam: Vital signs reviewed BMW:UXLK is a well-developed well-nourished alert cooperative  white female who appears her stated age in no acute distress.  HEENT: normocephalic atraumatic , Eyes: PERRL EOM's full, conjunctiva clear, Nares: paten,t no deformity discharge or tenderness., Ears: no deformity EAC's clear TMs with normal landmarks. Mouth: clear OP, no lesions, edema.  Moist mucous membranes. Dentition in adequate repair. NECK: supple without masses, thyromegaly or bruits. CHEST/PULM:  Clear to auscultation and percussion breath sounds equal no wheeze , rales or rhonchi. No chest wall deformities or tenderness. CV: PMI  is nondisplaced, S1 S2 no gallops, murmurs, rubs. Peripheral pulses are full without delay.No JVD .  ABDOMEN: Bowel sounds normal nontender  No guard or rebound, no hepato splenomegal no CVA tenderness.  No hernia. Extremtities:  No clubbing cyanosis or edema, no acute joint swelling or redness no focal atrophy NEURO:  Oriented x3, cranial nerves 3-12 appear to be intact, no obvious focal weakness,gait within normal limits no abnormal reflexes or asymmetrical SKIN: No acute rashes normal turgor, color, no bruising or petechiae. PSYCH: Oriented, good eye contact, no obvious depression anxiety, cognition and judgment appear normal. LN: no cervical axillary inguinal adenopathy  Lab Results    Component Value Date   WBC 8.0 04/19/2012   HGB 11.7* 04/19/2012   HCT 35.3* 04/19/2012   PLT 297.0 04/19/2012   GLUCOSE 107* 04/19/2012   CHOL 160 02/05/2012   TRIG 109 02/05/2012   HDL 51 02/05/2012   LDLCALC 87 02/05/2012   ALT 13 04/19/2012   AST 14 04/19/2012   NA 138 04/19/2012   K 4.1 04/19/2012   CL 105 04/19/2012   CREATININE 0.8 04/19/2012   BUN 13 04/19/2012   CO2 28 04/19/2012   TSH 2.326 02/01/2012   HGBA1C 6.2 04/19/2012       Assessment & Plan:  Preventive Health Care Counseled regarding healthy nutrition, exercise, sleep, injury prevention, calcium vit d and healthy weight .T. tdap today . BP elevation ok today Counseled. About monitoring and interventions ls and meds if needed.  On spirono per gyne for edema ?  Metabolic issues  Has mild hyperglycmeia Mild anemia prob from blood donation . Extra iron if going to donate blood . All rhinitis hx  Refill flonase .  Obesity  Weight loss would help BP  .

## 2012-05-20 ENCOUNTER — Encounter: Payer: Self-pay | Admitting: Internal Medicine

## 2012-06-01 ENCOUNTER — Other Ambulatory Visit: Payer: Self-pay | Admitting: Gynecology

## 2012-09-06 ENCOUNTER — Other Ambulatory Visit (INDEPENDENT_AMBULATORY_CARE_PROVIDER_SITE_OTHER): Payer: 59

## 2012-09-06 DIAGNOSIS — D649 Anemia, unspecified: Secondary | ICD-10-CM

## 2012-09-06 DIAGNOSIS — E109 Type 1 diabetes mellitus without complications: Secondary | ICD-10-CM

## 2012-09-06 LAB — CBC WITH DIFFERENTIAL/PLATELET
Basophils Absolute: 0 10*3/uL (ref 0.0–0.1)
Basophils Relative: 0.3 % (ref 0.0–3.0)
Eosinophils Absolute: 0.1 10*3/uL (ref 0.0–0.7)
Lymphocytes Relative: 21 % (ref 12.0–46.0)
MCHC: 32.6 g/dL (ref 30.0–36.0)
Neutrophils Relative %: 71 % (ref 43.0–77.0)
Platelets: 276 10*3/uL (ref 150.0–400.0)
RBC: 4.66 Mil/uL (ref 3.87–5.11)

## 2012-09-06 LAB — HEMOGLOBIN A1C: Hgb A1c MFr Bld: 6.3 % (ref 4.6–6.5)

## 2012-09-06 LAB — FERRITIN: Ferritin: 4 ng/mL — ABNORMAL LOW (ref 10.0–291.0)

## 2012-09-13 ENCOUNTER — Encounter: Payer: Self-pay | Admitting: Internal Medicine

## 2012-09-13 ENCOUNTER — Ambulatory Visit (INDEPENDENT_AMBULATORY_CARE_PROVIDER_SITE_OTHER): Payer: 59 | Admitting: Internal Medicine

## 2012-09-13 VITALS — BP 134/88 | HR 103 | Temp 98.6°F | Wt 230.0 lb

## 2012-09-13 DIAGNOSIS — R030 Elevated blood-pressure reading, without diagnosis of hypertension: Secondary | ICD-10-CM

## 2012-09-13 DIAGNOSIS — R51 Headache: Secondary | ICD-10-CM

## 2012-09-13 DIAGNOSIS — M79645 Pain in left finger(s): Secondary | ICD-10-CM

## 2012-09-13 DIAGNOSIS — IMO0001 Reserved for inherently not codable concepts without codable children: Secondary | ICD-10-CM

## 2012-09-13 DIAGNOSIS — Z23 Encounter for immunization: Secondary | ICD-10-CM

## 2012-09-13 DIAGNOSIS — M79609 Pain in unspecified limb: Secondary | ICD-10-CM

## 2012-09-13 DIAGNOSIS — R7309 Other abnormal glucose: Secondary | ICD-10-CM

## 2012-09-13 DIAGNOSIS — D509 Iron deficiency anemia, unspecified: Secondary | ICD-10-CM

## 2012-09-13 NOTE — Progress Notes (Signed)
Chief Complaint  Patient presents with  . Follow-up    Has had frequent headaches and migraines.    HPI: Patient comes in today for follow up of  multiple medical problems.  1. BP readings :  Bp 118/79   And  120/ 82     Wrist  .   High at doctors office.  Sometimes   GI  ibs sx   Had celiac screen in past  2011 trying gluten free diet  To lose weight less carbs and breads lost 5 # in a month   More migraines   Lately   Massage  Helps neck pain  Has knots on back of neck with tension  Migraines about every 6- 8 weeks.    Visual  Disturbance  Scotoma. Used to get them just a few times a year . No vision loss.  Describes prodrome as  Bugs in field of vision; then headache about 1-2 hours  .  No meds. At present except IBU  No vomiting used to take midrin in remote past.  Left middle finger pain and swelling for month or so without trauma  ? If early arthritis but no other sx rest of hand   ROS: See pertinent positives and negatives per HPI.no numbness fevers other neuro sx. Blood in stool  Periods less scan on the continuous ocps  90 days   No blood in stool   Past Medical History  Diagnosis Date  . Allergy   . Depression   . GERD (gastroesophageal reflux disease)   . Migraines   . Heavy menses     on ocps  . Anemia   . IBS (irritable bowel syndrome)     by hx and had a neg colonscopy    Family History  Problem Relation Age of Onset  . Arthritis Mother   . Hypertension Mother   . Cancer Mother     MELANOMA  . Hyperlipidemia Mother   . Hypertension Maternal Grandmother   . Hypertension Maternal Grandfather   . Cancer Maternal Grandfather     MELANOMA  . Heart disease Paternal Grandmother   . Diabetes Paternal Grandfather   . Heart disease Paternal Grandfather     History   Social History  . Marital Status: Married    Spouse Name: N/A    Number of Children: N/A  . Years of Education: N/A   Social History Main Topics  . Smoking status: Never Smoker   . Smokeless  tobacco: Never Used  . Alcohol Use: Yes  . Drug Use: No  . Sexually Active: Yes    Birth Control/ Protection: Pill   Other Topics Concern  . None   Social History Narrative   Occupation: unemployedMarriedHH of 4Pet [id number]    Outpatient Encounter Prescriptions as of 09/13/2012  Medication Sig Dispense Refill  . acetaminophen (TYLENOL) 325 MG tablet Take 650 mg by mouth every 6 (six) hours as needed.        . cetirizine (ZYRTEC) 10 MG tablet Take 10 mg by mouth daily.      . fluticasone (FLONASE) 50 MCG/ACT nasal spray Place 2 sprays into the nose daily.  16 g  6  . JUNEL FE 1/20 1-20 MG-MCG tablet TAKE 1 TABLET BY MOUTH EVERY DAY FOR 3 MONTHS STRAIGHT  112 tablet  2  . omeprazole (PRILOSEC OTC) 20 MG tablet Take 20 mg by mouth daily.        Marland Kitchen spironolactone (ALDACTONE) 25 MG tablet Take 1 tablet (  25 mg total) by mouth daily.  30 tablet  11  . [DISCONTINUED] Norethindrone Acetate-Ethinyl Estrad-FE (LOESTRIN 24 FE) 1-20 MG-MCG(24) tablet Take 1 tablet by mouth daily.  1 Package  11     EXAM: BP 134/88  Pulse 103  Temp 98.6 F (37 C) (Oral)  Wt 230 lb (104.327 kg)  SpO2 98%  LMP 09/04/2012 GENERAL: vitals reviewed and listed above, alert, oriented, appears well hydrated and in no acute distress  HEENT: atraumatic, conjunctiva  clear, no obvious abnormalities on inspection of external nose and ears   NECK: no obvious masses on inspection palpation   CV: HRRR, no clubbing cyanosis or  peripheral edema nl cap refill  Hand left middle finger with nodular swelling dip are without redness good rom and perfusion MS: moves all extremities without noticeable focal  abnormality  PSYCH: pleasant and cooperative, no obvious depression or anxiety Neuro appears non focal  Lab Results  Component Value Date   WBC 7.9 09/06/2012   HGB 12.6 09/06/2012   HCT 38.8 09/06/2012   PLT 276.0 09/06/2012   GLUCOSE 107* 04/19/2012   CHOL 160 02/05/2012   TRIG 109 02/05/2012   HDL 51 02/05/2012   LDLCALC  87 02/05/2012   ALT 13 04/19/2012   AST 14 04/19/2012   NA 138 04/19/2012   K 4.1 04/19/2012   CL 105 04/19/2012   CREATININE 0.8 04/19/2012   BUN 13 04/19/2012   CO2 28 04/19/2012   TSH 2.326 02/01/2012   HGBA1C 6.3 09/06/2012   Lab Results  Component Value Date   FERRITIN 4.0* 09/06/2012    ASSESSMENT AND PLAN:  Discussed the following assessment and plan: Concern about ocps and visual migraines.    1. Need for prophylactic vaccination and inoculation against influenza    2. HYPERGLYCEMIA    3. Elevated BP    4. Finger pain, left  DG Finger Middle Left   swelling dip check x ray  5. IRON DEFICIENCY     bettter  had neg celiac panel 2011  6. Headache     increase frequency with visual aura about every 6- 8 weeks  concern about risk of cva risk at her age and hormonal rx  dc for now will get info to dr Lily Peer    Left middle finger   X ray  R/O underlying bony sx  And then to observe.  Fu if   persistent or progressive  Ha calendar.                                         -Patient advised to return or notify health care team  immediately if symptoms worsen or persist or new concerns arise.  Patient Instructions  Concern about classic migraines with visual changes and risk of stroke on hormonal therapy.  I would suggest to stop and we discussed this with Dr. Lily Peer I will flagged him.  Your anemia is better but still iron deficient. Would check her thyroid panel every year. Please do a headache calendar can take ibuprofen or 2 Aleve at the onset of headache if needed. Your blood sugar is steady can repeat the A1c in about 4 months continue decreasing simple carbs and losing weight. Monitor your blood pressure occasionally.   Neta Mends. Panosh M.D.

## 2012-09-13 NOTE — Patient Instructions (Signed)
Concern about classic migraines with visual changes and risk of stroke on hormonal therapy.  I would suggest to stop and we discussed this with Dr. Lily Peer I will flagged him.  Your anemia is better but still iron deficient. Would check her thyroid panel every year. Please do a headache calendar can take ibuprofen or 2 Aleve at the onset of headache if needed. Your blood sugar is steady can repeat the A1c in about 4 months continue decreasing simple carbs and losing weight. Monitor your blood pressure occasionally.

## 2012-09-20 ENCOUNTER — Ambulatory Visit (INDEPENDENT_AMBULATORY_CARE_PROVIDER_SITE_OTHER)
Admission: RE | Admit: 2012-09-20 | Discharge: 2012-09-20 | Disposition: A | Discharge status: Home / Self Care | Payer: 59 | Source: Ambulatory Visit | Attending: Internal Medicine | Admitting: Internal Medicine

## 2012-09-20 DIAGNOSIS — M79609 Pain in unspecified limb: Secondary | ICD-10-CM

## 2012-09-20 DIAGNOSIS — M79645 Pain in left finger(s): Secondary | ICD-10-CM

## 2013-01-03 ENCOUNTER — Other Ambulatory Visit: Payer: 59

## 2013-01-10 ENCOUNTER — Other Ambulatory Visit (INDEPENDENT_AMBULATORY_CARE_PROVIDER_SITE_OTHER): Payer: 59

## 2013-01-10 DIAGNOSIS — R7309 Other abnormal glucose: Secondary | ICD-10-CM

## 2013-01-10 DIAGNOSIS — I10 Essential (primary) hypertension: Secondary | ICD-10-CM

## 2013-01-10 DIAGNOSIS — R739 Hyperglycemia, unspecified: Secondary | ICD-10-CM

## 2013-01-10 LAB — CBC WITH DIFFERENTIAL/PLATELET
Basophils Relative: 0.6 % (ref 0.0–3.0)
Eosinophils Relative: 0.9 % (ref 0.0–5.0)
Hemoglobin: 12.4 g/dL (ref 12.0–15.0)
Lymphs Abs: 1.6 10*3/uL (ref 0.7–4.0)
Monocytes Absolute: 0.5 10*3/uL (ref 0.1–1.0)
Neutro Abs: 5.5 10*3/uL (ref 1.4–7.7)

## 2013-01-10 LAB — TSH: TSH: 1.45 u[IU]/mL (ref 0.35–5.50)

## 2013-01-10 LAB — T4, FREE: Free T4: 0.75 ng/dL (ref 0.60–1.60)

## 2013-01-10 LAB — HEMOGLOBIN A1C: Hgb A1c MFr Bld: 6.1 % (ref 4.6–6.5)

## 2013-01-10 LAB — BASIC METABOLIC PANEL
CO2: 25 mEq/L (ref 19–32)
Chloride: 105 mEq/L (ref 96–112)
Creatinine, Ser: 0.8 mg/dL (ref 0.4–1.2)
Potassium: 3.6 mEq/L (ref 3.5–5.1)

## 2013-01-17 ENCOUNTER — Encounter: Payer: 59 | Admitting: Internal Medicine

## 2013-01-17 ENCOUNTER — Encounter: Payer: Self-pay | Admitting: Internal Medicine

## 2013-01-17 NOTE — Progress Notes (Signed)
Opened  But pt canceled for  Bad weather .

## 2013-01-21 ENCOUNTER — Ambulatory Visit: Payer: 59 | Admitting: Internal Medicine

## 2013-01-27 ENCOUNTER — Ambulatory Visit (INDEPENDENT_AMBULATORY_CARE_PROVIDER_SITE_OTHER): Payer: 59 | Admitting: Internal Medicine

## 2013-01-27 ENCOUNTER — Encounter: Payer: Self-pay | Admitting: Internal Medicine

## 2013-01-27 VITALS — BP 120/82 | HR 86 | Temp 98.7°F | Wt 217.0 lb

## 2013-01-27 DIAGNOSIS — I10 Essential (primary) hypertension: Secondary | ICD-10-CM

## 2013-01-27 DIAGNOSIS — D509 Iron deficiency anemia, unspecified: Secondary | ICD-10-CM

## 2013-01-27 DIAGNOSIS — R51 Headache: Secondary | ICD-10-CM

## 2013-01-27 DIAGNOSIS — D649 Anemia, unspecified: Secondary | ICD-10-CM

## 2013-01-27 DIAGNOSIS — R7309 Other abnormal glucose: Secondary | ICD-10-CM

## 2013-01-27 NOTE — Progress Notes (Signed)
Chief Complaint  Patient presents with  . Follow-up    HPI:  Patient comes in today for follow up of  multiple medical problems.  Is now of OCPS combines and her HAs are much better   BP better HAs    Better just sinus  HA now  Iron ; Weight   Intensify lifestyle interventions. Feels much better  To arrange gyne fu .  For period control if needed  Other options   Now in porgram  [facility] not me  For dm prevention bia insurance  Pre diabetes ROS: See pertinent positives and negatives per HPI.  Past Medical History  Diagnosis Date  . Allergy   . Depression   . GERD (gastroesophageal reflux disease)   . Migraines   . Heavy menses     on ocps  . Anemia   . IBS (irritable bowel syndrome)     by hx and had a neg colonscopy    Family History  Problem Relation Age of Onset  . Arthritis Mother   . Hypertension Mother   . Cancer Mother     MELANOMA  . Hyperlipidemia Mother   . Hypertension Maternal Grandmother   . Hypertension Maternal Grandfather   . Cancer Maternal Grandfather     MELANOMA  . Heart disease Paternal Grandmother   . Diabetes Paternal Grandfather   . Heart disease Paternal Grandfather     History   Social History  . Marital Status: Married    Spouse Name: N/A    Number of Children: N/A  . Years of Education: N/A   Social History Main Topics  . Smoking status: Never Smoker   . Smokeless tobacco: Never Used  . Alcohol Use: Yes  . Drug Use: No  . Sexually Active: Yes    Birth Control/ Protection: Pill   Other Topics Concern  . None   Social History Narrative   Occupation: unemployed   Married   HH of 4   Pet dog   [id number]             Outpatient Encounter Prescriptions as of 01/27/2013  Medication Sig Dispense Refill  . acetaminophen (TYLENOL) 325 MG tablet Take 650 mg by mouth every 6 (six) hours as needed.        . cetirizine (ZYRTEC) 10 MG tablet Take 10 mg by mouth daily.      . fluticasone (FLONASE) 50 MCG/ACT nasal spray Place 2 sprays  into the nose daily.  16 g  6  . omeprazole (PRILOSEC OTC) 20 MG tablet Take 20 mg by mouth daily.       Marland Kitchen spironolactone (ALDACTONE) 25 MG tablet Take 1 tablet (25 mg total) by mouth daily.  30 tablet  11  . [DISCONTINUED] JUNEL FE 1/20 1-20 MG-MCG tablet TAKE 1 TABLET BY MOUTH EVERY DAY FOR 3 MONTHS STRAIGHT  112 tablet  2   No facility-administered encounter medications on file as of 01/27/2013.    EXAM:  BP 120/82  Pulse 86  Temp(Src) 98.7 F (37.1 C) (Oral)  Wt 217 lb (98.431 kg)  BMI 36.11 kg/m2  SpO2 98%  LMP 01/24/2013  Body mass index is 36.11 kg/(m^2).  GENERAL: vitals reviewed and listed above, alert, oriented, appears well hydrated and in no acute distress  HEENT: atraumatic, conjunctiva  clear, no obvious abnormalities on inspection of external nose and ears OP : no lesion edema or exudate   NECK: no obvious masses on inspection palpation   LUNGS: clear to auscultation  bilaterally, no wheezes, rales or rhonchi, good air movement  CV: HRRR, no clubbing cyanosis or  peripheral edema nl cap refill   MS: moves all extremities without noticeable focal  abnormality  PSYCH: pleasant and cooperative, no obvious depression or anxiety Lab Results  Component Value Date   WBC 7.7 01/10/2013   HGB 12.4 01/10/2013   HCT 37.0 01/10/2013   PLT 276.0 01/10/2013   GLUCOSE 109* 01/10/2013   CHOL 160 02/05/2012   TRIG 109 02/05/2012   HDL 51 02/05/2012   LDLCALC 87 02/05/2012   ALT 13 04/19/2012   AST 14 04/19/2012   NA 138 01/10/2013   K 3.6 01/10/2013   CL 105 01/10/2013   CREATININE 0.8 01/10/2013   BUN 17 01/10/2013   CO2 25 01/10/2013   TSH 1.45 01/10/2013   HGBA1C 6.1 01/10/2013   Lab Results  Component Value Date   IRON 35* 03/10/2010   FERRITIN 5.3* 01/10/2013    ASSESSMENT AND PLAN:  Discussed the following assessment and plan:  HYPERGLYCEMIA - in not me program at the y losing weight and dietary changes  motivated   ANEMIA  IRON DEFICIENCY  Headache - better off  ocps  HTN (hypertension) - better off ocps  and weigh loss Counseled. And encouraged to follow through seems motivated  For weight loss and to help her metabolic issues   Still see gyne  Other options if needed for heavy periods . -Patient advised to return or notify health care team  if symptoms worsen or persist or new concerns arise.  Patient Instructions  Labs are good  You are still iron deficient  and may do better still with iron   Supplement .   Once or twice a day.  Continue the program  .   At the [facility]. Continue stay off the  Ocps.      Neta Mends. Panosh M.D.

## 2013-01-27 NOTE — Patient Instructions (Addendum)
Labs are good  You are still iron deficient  and may do better still with iron   Supplement .   Once or twice a day.  Continue the program  .   At the [facility]. Continue stay off the  Ocps.

## 2013-02-01 ENCOUNTER — Encounter: Payer: Self-pay | Admitting: Internal Medicine

## 2013-03-03 ENCOUNTER — Other Ambulatory Visit: Payer: Self-pay | Admitting: Gynecology

## 2013-03-25 ENCOUNTER — Encounter: Payer: Self-pay | Admitting: Gynecology

## 2013-04-10 ENCOUNTER — Encounter: Payer: Self-pay | Admitting: Gynecology

## 2013-04-10 ENCOUNTER — Ambulatory Visit (INDEPENDENT_AMBULATORY_CARE_PROVIDER_SITE_OTHER): Payer: 59 | Admitting: Gynecology

## 2013-04-10 VITALS — BP 118/80 | Ht 65.0 in | Wt 206.0 lb

## 2013-04-10 DIAGNOSIS — N920 Excessive and frequent menstruation with regular cycle: Secondary | ICD-10-CM

## 2013-04-10 DIAGNOSIS — D649 Anemia, unspecified: Secondary | ICD-10-CM

## 2013-04-10 DIAGNOSIS — Z833 Family history of diabetes mellitus: Secondary | ICD-10-CM

## 2013-04-10 DIAGNOSIS — Z01419 Encounter for gynecological examination (general) (routine) without abnormal findings: Secondary | ICD-10-CM

## 2013-04-10 LAB — HEMOGLOBIN A1C: Mean Plasma Glucose: 126 mg/dL — ABNORMAL HIGH (ref <117)

## 2013-04-10 NOTE — Progress Notes (Signed)
Brandy Maynard [birth year] [id number]   History:    42 y.o.  for annual gyn exam who states that she has history of irregular periods were as her cycles are lasting anywhere between every 28-35 days and lasting for about 5 days but very heavy. She had been on oral contraceptive pill and her internist at taking her off of it because of her blood pressure. She has a history right salpingo-oophorectomy and fulguration of left fallopian tube. Her last Pap smear was in 2012 she denies any prior history of abnormal Pap smears. She had a normal colonoscopy in 2006 and has a history of IBS. Her primary physician has done most of her lab work. She was weighing 231 pounds and is down to 206 as part of regular exercise and diet. She wanted to check her fasting lipid profile today. She does have family history of diabetes.  Past medical history,surgical history, family history and social history were all reviewed and documented in the EPIC chart.  Gynecologic History Patient's last menstrual period was 03/22/2013. Contraception: tubal ligation Last Pap: 2012. Results were: normal Last mammogram: 2013. Results were: normal  Obstetric History OB History   Grav Para Term Preterm Abortions TAB SAB Ect Mult Living   3 2 2  1  1   2      # Outc Date GA Lbr Len/2nd Wgt Sex Del Anes PTL Lv   1 TRM     F SVD  No Yes   2 TRM     F SVD  No Yes   3 SAB                ROS: A ROS was performed and pertinent positives and negatives are included in the history.  GENERAL: No fevers or chills. HEENT: No change in vision, no earache, sore throat or sinus congestion. NECK: No pain or stiffness. CARDIOVASCULAR: No chest pain or pressure. No palpitations. PULMONARY: No shortness of breath, cough or wheeze. GASTROINTESTINAL: No abdominal pain, nausea, vomiting or diarrhea, melena or bright red blood per rectum. GENITOURINARY: No urinary frequency, urgency, hesitancy or dysuria. MUSCULOSKELETAL: No joint or muscle pain, no back  pain, no recent trauma. DERMATOLOGIC: No rash, no itching, no lesions. ENDOCRINE: No polyuria, polydipsia, no heat or cold intolerance. No recent change in weight. HEMATOLOGICAL: No anemia or easy bruising or bleeding. NEUROLOGIC: No headache, seizures, numbness, tingling or weakness. PSYCHIATRIC: No depression, no loss of interest in normal activity or change in sleep pattern.     Exam: chaperone present  BP 118/80  Ht 5\' 5"  (1.651 m)  Wt 206 lb (93.441 kg)  BMI 34.28 kg/m2  LMP 03/22/2013  Body mass index is 34.28 kg/(m^2).  General appearance : Well developed well nourished female. No acute distress HEENT: Neck supple, trachea midline, no carotid bruits, no thyroidmegaly Lungs: Clear to auscultation, no rhonchi or wheezes, or rib retractions  Heart: Regular rate and rhythm, no murmurs or gallops Breast:Examined in sitting and supine position were symmetrical in appearance, no palpable masses or tenderness,  no skin retraction, no nipple inversion, no nipple discharge, no skin discoloration, no axillary or supraclavicular lymphadenopathy Abdomen: no palpable masses or tenderness, no rebound or guarding Extremities: no edema or skin discoloration or tenderness  Pelvic:  Bartholin, Urethra, Skene Glands: Within normal limits             Vagina: No gross lesions or discharge  Cervix: No gross lesions or discharge  Uterus  anteverted, normal size, shape and  consistency, non-tender and mobile  Adnexa  Without masses or tenderness  Anus and perineum  normal   Rectovaginal  normal sphincter tone without palpated masses or tenderness             Hemoccult not indicated     Assessment/Plan:  42 y.o. female for annual exam who suffers from dysmenorrhea menorrhagia and on deficiency anemia. She was taken off oral contraceptive pills because of her hypertension. She was normotensive today. We discussed the Mirena IUD which were controlled the amount of bleeding and indirectly improve her  anemia. Literature and information was provided. The Mirena IUD should not affect her blood pressure. We'll check a fasting lipid profile today along with a hemoglobin A1c and urinalysis. No Pap smear done today the new guidelines were discussed. She was reminded to schedule her mammogram and to do her monthly breast exams.    Ok Edwards MD, 1:31 PM 04/10/2013

## 2013-04-10 NOTE — Patient Instructions (Signed)
Intrauterine Device Information  An intrauterine device (IUD) is inserted into your uterus and prevents pregnancy. There are 2 types of IUDs available:  · Copper IUD. This type of IUD is wrapped in copper wire and is placed inside the uterus. Copper makes the uterus and fallopian tubes produce a fluid that kills sperm. The copper IUD can stay in place for 10 years.  · Hormone IUD. This type of IUD contains the hormone progestin (synthetic progesterone). The hormone thickens the cervical mucus and prevents sperm from entering the uterus, and it also thins the uterine lining to prevent implantation of a fertilized egg. The hormone can weaken or kill the sperm that get into the uterus. The hormone IUD can stay in place for 5 years.  Your caregiver will make sure you are a good candidate for a contraceptive IUD. Discuss with your caregiver the possible side effects.  ADVANTAGES  · It is highly effective, reversible, long-acting, and low maintenance.  · There are no estrogen-related side effects.  · An IUD can be used when breastfeeding.  · It is not associated with weight gain.  · It works immediately after insertion.  · The copper IUD does not interfere with your female hormones.  · The progesterone IUD can make heavy menstrual periods lighter.  · The progesterone IUD can be used for 5 years.  · The copper IUD can be used for 10 years.  DISADVANTAGES  · The progesterone IUD can be associated with irregular bleeding patterns.  · The copper IUD can make your menstrual flow heavier and more painful.  · You may experience cramping and vaginal bleeding after insertion.  Document Released: 09/19/2004 Document Revised: 01/08/2012 Document Reviewed: 02/18/2011  ExitCare® Patient Information ©2014 [employer].

## 2013-04-11 ENCOUNTER — Other Ambulatory Visit: Payer: Self-pay | Admitting: Gynecology

## 2013-04-11 LAB — URINALYSIS W MICROSCOPIC + REFLEX CULTURE
Hgb urine dipstick: NEGATIVE
Leukocytes, UA: NEGATIVE
Nitrite: NEGATIVE
Protein, ur: NEGATIVE mg/dL
Squamous Epithelial / LPF: NONE SEEN
Urobilinogen, UA: 0.2 mg/dL (ref 0.0–1.0)

## 2013-04-15 ENCOUNTER — Other Ambulatory Visit: Payer: Self-pay | Admitting: Gynecology

## 2013-04-15 DIAGNOSIS — R7309 Other abnormal glucose: Secondary | ICD-10-CM

## 2013-04-15 DIAGNOSIS — E7800 Pure hypercholesterolemia, unspecified: Secondary | ICD-10-CM

## 2013-04-22 ENCOUNTER — Encounter: Payer: Self-pay | Admitting: Gynecology

## 2013-04-23 ENCOUNTER — Encounter: Payer: Self-pay | Admitting: Gynecology

## 2013-04-24 ENCOUNTER — Encounter: Payer: Self-pay | Admitting: Gynecology

## 2013-04-30 ENCOUNTER — Other Ambulatory Visit: Payer: 59

## 2013-04-30 DIAGNOSIS — E7800 Pure hypercholesterolemia, unspecified: Secondary | ICD-10-CM

## 2013-04-30 DIAGNOSIS — R7309 Other abnormal glucose: Secondary | ICD-10-CM

## 2013-04-30 LAB — LIPID PANEL
Cholesterol: 147 mg/dL (ref 0–200)
LDL Cholesterol: 86 mg/dL (ref 0–99)
VLDL: 16 mg/dL (ref 0–40)

## 2013-06-24 ENCOUNTER — Ambulatory Visit (INDEPENDENT_AMBULATORY_CARE_PROVIDER_SITE_OTHER): Payer: 59 | Admitting: Gynecology

## 2013-06-24 ENCOUNTER — Encounter: Payer: Self-pay | Admitting: Gynecology

## 2013-06-24 VITALS — BP 126/78

## 2013-06-24 DIAGNOSIS — M62830 Muscle spasm of back: Secondary | ICD-10-CM | POA: Insufficient documentation

## 2013-06-24 DIAGNOSIS — M545 Low back pain, unspecified: Secondary | ICD-10-CM

## 2013-06-24 DIAGNOSIS — M5380 Other specified dorsopathies, site unspecified: Secondary | ICD-10-CM

## 2013-06-24 LAB — URINALYSIS W MICROSCOPIC + REFLEX CULTURE
Glucose, UA: NEGATIVE mg/dL
Protein, ur: NEGATIVE mg/dL
Urobilinogen, UA: 0.2 mg/dL (ref 0.0–1.0)

## 2013-06-24 MED ORDER — PREDNISONE (PAK) 10 MG PO TABS: 10.0000 mg | ORAL_TABLET | Freq: Every day | ORAL | Status: DC

## 2013-06-24 MED ORDER — CYCLOBENZAPRINE HCL 10 MG PO TABS: ORAL_TABLET | ORAL | Status: DC

## 2013-06-24 MED ORDER — IBUPROFEN 800 MG PO TABS: 800.0000 mg | ORAL_TABLET | Freq: Three times a day (TID) | ORAL | Status: DC | PRN

## 2013-06-24 NOTE — Addendum Note (Signed)
Addended by: Bertram Savin A on: 06/24/2013 04:23 PM   Modules accepted: Orders

## 2013-06-24 NOTE — Patient Instructions (Signed)
Musculoskeletal Pain °Musculoskeletal pain is muscle and boney aches and pains. These pains can occur in any part of the body. Your caregiver may treat you without knowing the cause of the pain. They may treat you if blood or urine tests, X-rays, and other tests were normal.  °CAUSES °There is often not a definite cause or reason for these pains. These pains may be caused by a type of germ (virus). The discomfort may also come from overuse. Overuse includes working out too hard when your body is not fit. Boney aches also come from weather changes. Bone is sensitive to atmospheric pressure changes. °HOME CARE INSTRUCTIONS  °· Ask when your test results will be ready. Make sure you get your test results. °· Only take over-the-counter or prescription medicines for pain, discomfort, or fever as directed by your caregiver. If you were given medications for your condition, do not drive, operate machinery or power tools, or sign legal documents for 24 hours. Do not drink alcohol. Do not take sleeping pills or other medications that may interfere with treatment. °· Continue all activities unless the activities cause more pain. When the pain lessens, slowly resume normal activities. Gradually increase the intensity and duration of the activities or exercise. °· During periods of severe pain, bed rest may be helpful. Lay or sit in any position that is comfortable. °· Putting ice on the injured area. °· Put ice in a bag. °· Place a towel between your skin and the bag. °· Leave the ice on for 15 to 20 minutes, 3 to 4 times a day. °· Follow up with your caregiver for continued problems and no reason can be found for the pain. If the pain becomes worse or does not go away, it may be necessary to repeat tests or do additional testing. Your caregiver may need to look further for a possible cause. °SEEK IMMEDIATE MEDICAL CARE IF: °· You have pain that is getting worse and is not relieved by medications. °· You develop chest pain  that is associated with shortness or breath, sweating, feeling sick to your stomach (nauseous), or throw up (vomit). °· Your pain becomes localized to the abdomen. °· You develop any new symptoms that seem different or that concern you. °MAKE SURE YOU:  °· Understand these instructions. °· Will watch your condition. °· Will get help right away if you are not doing well or get worse. °Document Released: 10/16/2005 Document Revised: 01/08/2012 Document Reviewed: 06/05/2008 °ExitCare® Patient Information ©2014 [employer]. ° °

## 2013-06-24 NOTE — Progress Notes (Signed)
Patient is a 42 year old who presented to the office today complaining of vague low back discomfort that she had described with minimal radiation to her right lower abdomen. Patient does have past history of right salpingo-oophorectomy as well as cholecystectomy. She also has sterilization of her contralateral ovary. The patient does suffer from IBS. Patient denied any fever, chills, nausea, or vomiting. Patient denied any dysuria or frequency. She reports no unusual vaginal bleeding. Patient reports no radiculopathy. Patient does state that she has been lifting her 40 pound dog several times during the day.  Exam: Back: No CVA tenderness but when she rotates her hip she feels some tenderness in her lower back. There was no step down or any point tenderness elicited during back exam. Leg raises without eliciting any discomfort.   Abdomen: Soft nontender no rebound guarding. Negative heel tap, negative Rovsing sign. Pelvic: Bartholin urethra Skene within normal limits Vagina: No lesions or discharge Cervix: No lesions or discharge Bimanual exam: No palpable masses or tenderness uterus anteverted normal size shape and consistency Adnexa no masses or tenderness Rectal exam unremarkable  Urinalysis, 0-2 RBC, WBC 0-2, few bacteria urine submitted for culture  Assessment/plan: Patient appears to have a mild muscle spasm in her lower back. She will be started on  Prednisone (Sterapred daily PAC) 10 mg tabletthe she will take as prescribed for 6 days. She'll also be prescribed Flexeril 10 mg one by mouth each bedtime for 7-10 days. She will also be prescribed Motrin 800 mg take 1 by mouth 3 times a day for the next 7-10 days. She may use local heat. If this does not improve after the above treatment prescribed she will contact the office and we will need to refer her to an orthopedist or neurosurgeon for further evaluation to rule out any herniated disc. Urine was submitted for culture.

## 2013-06-25 LAB — URINE CULTURE: Organism ID, Bacteria: NO GROWTH

## 2013-07-25 ENCOUNTER — Other Ambulatory Visit (INDEPENDENT_AMBULATORY_CARE_PROVIDER_SITE_OTHER): Payer: 59

## 2013-07-25 DIAGNOSIS — D509 Iron deficiency anemia, unspecified: Secondary | ICD-10-CM

## 2013-07-25 DIAGNOSIS — R739 Hyperglycemia, unspecified: Secondary | ICD-10-CM

## 2013-07-25 DIAGNOSIS — R7309 Other abnormal glucose: Secondary | ICD-10-CM

## 2013-07-25 LAB — CBC WITH DIFFERENTIAL/PLATELET
Eosinophils Relative: 1.2 % (ref 0.0–5.0)
HCT: 37.7 % (ref 36.0–46.0)
Hemoglobin: 12.7 g/dL (ref 12.0–15.0)
Lymphs Abs: 1.3 10*3/uL (ref 0.7–4.0)
Monocytes Relative: 5.4 % (ref 3.0–12.0)
Neutro Abs: 6.7 10*3/uL (ref 1.4–7.7)
WBC: 8.6 10*3/uL (ref 4.5–10.5)

## 2013-08-01 ENCOUNTER — Ambulatory Visit (INDEPENDENT_AMBULATORY_CARE_PROVIDER_SITE_OTHER): Payer: 59 | Admitting: Internal Medicine

## 2013-08-01 ENCOUNTER — Encounter: Payer: Self-pay | Admitting: Internal Medicine

## 2013-08-01 VITALS — BP 90/64 | HR 92 | Temp 98.2°F | Wt 204.0 lb

## 2013-08-01 DIAGNOSIS — R030 Elevated blood-pressure reading, without diagnosis of hypertension: Secondary | ICD-10-CM

## 2013-08-01 DIAGNOSIS — R7309 Other abnormal glucose: Secondary | ICD-10-CM

## 2013-08-01 DIAGNOSIS — Z23 Encounter for immunization: Secondary | ICD-10-CM

## 2013-08-01 DIAGNOSIS — D509 Iron deficiency anemia, unspecified: Secondary | ICD-10-CM

## 2013-08-01 DIAGNOSIS — E611 Iron deficiency: Secondary | ICD-10-CM | POA: Insufficient documentation

## 2013-08-01 DIAGNOSIS — IMO0001 Reserved for inherently not codable concepts without codable children: Secondary | ICD-10-CM

## 2013-08-01 NOTE — Progress Notes (Signed)
Chief Complaint  Patient presents with  . Follow-up    HPI: FU bp and blood sugar  elevation bp  Off ocps    Not using periods are heavier again GYN suggested my renal IUD Still in diabetes program very helpful has been him to lose weight even under a stressful summer Gyne check in august for lbp felt to be ms cause  Stress ful  Summer   Dog has had  Difficult  medical problems better now ROS: See pertinent positives and negatives per HPI. Had muscle spasm of the back was given prednisone but hasn't  taken it. Past Medical History  Diagnosis Date  . Allergy   . Depression   . GERD (gastroesophageal reflux disease)   . Migraines   . Heavy menses     on ocps  . Anemia   . IBS (irritable bowel syndrome)     by hx and had a neg colonscopy  . ANEMIA 12/15/2009    Qualifier: Diagnosis of  By: Fabian Sharp MD, Neta Mends     Family History  Problem Relation Age of Onset  . Arthritis Mother   . Hypertension Mother   . Cancer Mother     MELANOMA  . Hyperlipidemia Mother   . Hypertension Maternal Grandmother   . Dementia Maternal Grandmother   . Hypertension Maternal Grandfather   . Cancer Maternal Grandfather     MELANOMA  . Heart disease Paternal Grandmother   . Diabetes Paternal Grandfather   . Heart disease Paternal Grandfather   . Heart disease Father     History   Social History  . Marital Status: Married    Spouse Name: N/A    Number of Children: N/A  . Years of Education: N/A   Social History Main Topics  . Smoking status: Never Smoker   . Smokeless tobacco: Never Used  . Alcohol Use: Yes  . Drug Use: No  . Sexual Activity: Yes   Other Topics Concern  . None   Social History Narrative   Occupation: unemployed   Married   HH of 4   Pet dog   [id number]             Outpatient Encounter Prescriptions as of 08/01/2013  Medication Sig Dispense Refill  . cetirizine (ZYRTEC) 10 MG tablet Take 10 mg by mouth daily.      . fluticasone (FLONASE) 50 MCG/ACT nasal spray  Place 2 sprays into the nose daily.  16 g  6  . ibuprofen (ADVIL,MOTRIN) 800 MG tablet Take 1 tablet (800 mg total) by mouth every 8 (eight) hours as needed for pain.  30 tablet  1  . omeprazole (PRILOSEC OTC) 20 MG tablet Take 20 mg by mouth daily.       Marland Kitchen spironolactone (ALDACTONE) 25 MG tablet TAKE 1 TABLET EVERY DAY  30 tablet  10  . [DISCONTINUED] acetaminophen (TYLENOL) 325 MG tablet Take 650 mg by mouth every 6 (six) hours as needed.        . [DISCONTINUED] cyclobenzaprine (FLEXERIL) 10 MG tablet Take one at dinner for 1 week  20 tablet  0  . [DISCONTINUED] predniSONE (STERAPRED UNI-PAK) 10 MG tablet Take 1 tablet (10 mg total) by mouth daily. 10 Mg Steroid Pak for 6 days #21  21 tablet  0   No facility-administered encounter medications on file as of 08/01/2013.    EXAM:  BP 90/64  Pulse 92  Temp(Src) 98.2 F (36.8 C) (Oral)  Wt 204 lb (92.534 kg)  BMI 33.95 kg/m2  SpO2 97%  LMP 07/24/2013  Body mass index is 33.95 kg/(m^2).  GENERAL: vitals reviewed and listed above, alert, oriented, appears well hydrated and in no acute distress HEENT: atraumatic, conjunctiva  clear, no obvious abnormalities on inspection of external nose and ears  NECK: no obvious masses on inspection palpation  MS: moves all extremities without noticeable focal  abnormality PSYCH: pleasant and cooperative, no obvious depression or anxiety Laboratory studies hemoglobin A1c reviewed ferritin is still low at 5 no anemia Lab Results  Component Value Date   WBC 8.6 07/25/2013   HGB 12.7 07/25/2013   HCT 37.7 07/25/2013   PLT 267.0 07/25/2013   GLUCOSE 109* 01/10/2013   CHOL 147 04/30/2013   TRIG 78 04/30/2013   HDL 45 04/30/2013   LDLCALC 86 04/30/2013   ALT 13 04/19/2012   AST 14 04/19/2012   NA 138 01/10/2013   K 3.6 01/10/2013   CL 105 01/10/2013   CREATININE 0.8 01/10/2013   BUN 17 01/10/2013   CO2 25 01/10/2013   TSH 1.45 01/10/2013   HGBA1C 6.3 07/25/2013   Wt Readings from Last 3 Encounters:  08/01/13 204  lb (92.534 kg)  04/10/13 206 lb (93.441 kg)  01/27/13 217 lb (98.431 kg)    ASSESSMENT AND PLAN:  Discussed the following assessment and plan:  HYPERGLYCEMIA - the same  continue weight loss and diabetes program and fu   Elevated BP - resolved related to LS and OCPS  continu lsi  Need for prophylactic vaccination and inoculation against influenza - Plan: Flu Vaccine QUAD 36+ mos PF IM (Fluarix)  Iron deficiency - now without anemia  periods back and somwhat dheavy iron supp consider iud   -Patient advised to return or notify health care team  if symptoms worsen or persist or new concerns arise.  Patient Instructions  Continue lifestyle intervention healthy eating and exercise . Hg a1c and bmp in 4 months  Pre visit .  Reconsider iud for decreasing iron loss in the meantime .  Neta Mends. Panosh M.D.  Total visit > 50% spent counseling and coordinating care

## 2013-08-01 NOTE — Patient Instructions (Addendum)
Continue lifestyle intervention healthy eating and exercise . Hg a1c and bmp in 4 months  Pre visit .  Reconsider iud for decreasing iron loss in the meantime .

## 2013-10-10 ENCOUNTER — Telehealth: Payer: Self-pay | Admitting: Internal Medicine

## 2013-10-10 NOTE — Telephone Encounter (Signed)
Pt would like  an appt as soon as you get back for not sleeping, and anxiety and under a lot of stress. Pt refused another provider. Pt aware that dr Fabian Sharp out next week. Nothing except SD

## 2013-10-16 ENCOUNTER — Ambulatory Visit (INDEPENDENT_AMBULATORY_CARE_PROVIDER_SITE_OTHER): Payer: 59

## 2013-10-16 ENCOUNTER — Ambulatory Visit (INDEPENDENT_AMBULATORY_CARE_PROVIDER_SITE_OTHER): Payer: 59 | Admitting: Podiatry

## 2013-10-16 ENCOUNTER — Encounter: Payer: Self-pay | Admitting: Podiatry

## 2013-10-16 DIAGNOSIS — M79609 Pain in unspecified limb: Secondary | ICD-10-CM

## 2013-10-16 DIAGNOSIS — M779 Enthesopathy, unspecified: Secondary | ICD-10-CM

## 2013-10-16 NOTE — Progress Notes (Signed)
   Subjective:    Patient ID: Brandy Maynard, female    DOB: [birth year], 42 y.o.   MRN: [id number]  HPI N foot pain         L right plantar forefoot        D year        O this episode began 06/2013        C pain and swelling        A exercise, walking        T history of injections, padding, and Mobic treatment by Dr Charlsie Merles    Review of Systems  Constitutional: Negative.   HENT: Negative.   Cardiovascular: Negative.   Gastrointestinal: Negative.   Endocrine: Negative.   Genitourinary: Negative.   Musculoskeletal: Negative.   Skin: Negative.   Allergic/Immunologic: Negative.   Neurological: Negative.   Hematological: Negative.        Anemic  Psychiatric/Behavioral: Negative.        Objective:   Physical Exam        Assessment & Plan:

## 2013-10-16 NOTE — Progress Notes (Signed)
Subjective:     Patient ID: Brandy Maynard, female   DOB: 12-16-1970, 42 y.o.   MRN: [id number]  HPI patient presents stating this area on my foot is all full and has been hurting me for the last several months. States orthotics are not helping her like she was hoping   Review of Systems     Objective:   Physical Exam Neurovascular status intact with no change in health history and pain in the third MPJ right noted to be very tender with fluid buildup    Assessment:     Chronic capsulitis right third MPJ of 15 month duration    Plan:     Discussed what we have done up to this point and considerations for further treatment. She cannot exercise like she wants and is starting to develop compensation in her foot and leg do to change in gait and would like to try a definitive solution. I have recommended a shortening osteotomy third metatarsal with screw fixation explaining that I am hopeful this will solve her problem but there is no guarantees. She is given consent form reads it and signs it after review of all complications and understands the total recovery. We'll take approximately 6 months to one year. Scheduled for outpatient surgery

## 2013-10-16 NOTE — Patient Instructions (Signed)
Pre-Operative Instructions  Congratulations, you have decided to take an important step to improving your quality of life.  You can be assured that the doctors of [facility] will be with you every step of the way.  1. Plan to be at the surgery center/hospital at least 1 (one) hour prior to your scheduled time unless otherwise directed by the surgical center/hospital staff.  You must have a responsible adult accompany you, remain during the surgery and drive you home.  Make sure you have directions to the surgical center/hospital and know how to get there on time. 2. For hospital based surgery you will need to obtain a history and physical form from your family physician within 1 month prior to the date of surgery- we will give you a form for you primary physician.  3. We make every effort to accommodate the date you request for surgery.  There are however, times where surgery dates or times have to be moved.  We will contact you as soon as possible if a change in schedule is required.   4. No Aspirin/Ibuprofen for one week before surgery.  If you are on aspirin, any non-steroidal anti-inflammatory medications (Mobic, Aleve, Ibuprofen) you should stop taking it 7 days prior to your surgery.  You make take Tylenol  For pain prior to surgery.  5. Medications- If you are taking daily heart and blood pressure medications, seizure, reflux, allergy, asthma, anxiety, pain or diabetes medications, make sure the surgery center/hospital is aware before the day of surgery so they may notify you which medications to take or avoid the day of surgery. 6. No food or drink after midnight the night before surgery unless directed otherwise by surgical center/hospital staff. 7. No alcoholic beverages 24 hours prior to surgery.  No smoking 24 hours prior to or 24 hours after surgery. 8. Wear loose pants or shorts- loose enough to fit over bandages, boots, and casts. 9. No slip on shoes, sneakers are best. 10. Bring  your boot with you to the surgery center/hospital.  Also bring crutches or a walker if your physician has prescribed it for you.  If you do not have this equipment, it will be provided for you after surgery. 11. If you have not been contracted by the surgery center/hospital by the day before your surgery, call to confirm the date and time of your surgery. 12. Leave-time from work may vary depending on the type of surgery you have.  Appropriate arrangements should be made prior to surgery with your employer. 13. Prescriptions will be provided immediately following surgery by your doctor.  Have these filled as soon as possible after surgery and take the medication as directed. 14. Remove nail polish on the operative foot. 15. Wash the night before surgery.  The night before surgery wash the foot and leg well with the antibacterial soap provided and water paying special attention to beneath the toenails and in between the toes.  Rinse thoroughly with water and dry well with a towel.  Perform this wash unless told not to do so by your physician.  Enclosed: 1 Ice pack (please put in freezer the night before surgery)   1 Hibiclens skin cleaner   Pre-op Instructions  If you have any questions regarding the instructions, do not hesitate to call our office.  [place]: [address] [place], [place] [address] [phone]  [place]: [address] [place], [place] [address] [phone]  [place]: [address]  , [place] [address] [phone]  Dr. Richard   Tuchman DPM, Dr. Norman Regal DPM Dr. Richard Sikora DPM, Dr. M. Todd Hyatt DPM, Dr. Kathryn Egerton DPM 

## 2013-10-18 NOTE — Telephone Encounter (Signed)
Ok to make appt  Prefer at  end of clinic session ie 11 30 or 4 pm etc . Make sure there are enough  sdas left 3 per day preferred .

## 2013-10-20 ENCOUNTER — Encounter: Payer: Self-pay | Admitting: Podiatry

## 2013-10-20 DIAGNOSIS — M21549 Acquired clubfoot, unspecified foot: Secondary | ICD-10-CM

## 2013-10-20 NOTE — Telephone Encounter (Signed)
lmom for pt to cb

## 2013-10-20 NOTE — Telephone Encounter (Signed)
Patient Information:  Caller Name: Karmella  Phone: [phone]  Patient: Brandy Maynard  Gender: Female  DOB: May 31, 1971  Age: 42 Years  PCP: Berniece Andreas [facility])  Pregnant: No  Office Follow Up:  Does the office need to follow up with this patient?: No  Instructions For The Office: N/A  RN Note:  She just had surgery today.  She does not need to see Dr. Lebron Quam at this time.  She states that she cannot drive for 2 weeks.  Symptoms  Reason For Call & Symptoms: Missed a call from [place]?  Reviewed Health History In EMR: N/A  Reviewed Medications In EMR: N/A  Reviewed Allergies In EMR: N/A  Reviewed Surgeries / Procedures: N/A  Date of Onset of Symptoms: 10/20/2013 OB / GYN:  LMP: Unknown  Guideline(s) Used:  No Protocol Available - Information Only  Disposition Per Guideline:   Home Care  Reason For Disposition Reached:   Information only question and nurse able to answer  Advice Given:  Call Back If:  New symptoms develop  You become worse.  Patient Will Follow Care Advice:  YES

## 2013-10-21 ENCOUNTER — Telehealth: Payer: Self-pay | Admitting: *Deleted

## 2013-10-21 NOTE — Telephone Encounter (Signed)
Pt had surgery on her foot yesterday. Pt will cb in a few weeks after she gets over this and sch an appt.

## 2013-10-21 NOTE — Telephone Encounter (Signed)
Pt complains of pain that is only covered by the Demerol for about 2 hours.  I instructed pt ot remove the ace only , elevate the foot 15 minutes then rewrap the foot looser.  I instructed pt to take Ibuprofen 200mg  2 tablets po qid in between doses of the Demerol and take it with food.  Pt states understanding.

## 2013-10-27 ENCOUNTER — Ambulatory Visit (INDEPENDENT_AMBULATORY_CARE_PROVIDER_SITE_OTHER): Payer: 59 | Admitting: Podiatry

## 2013-10-27 ENCOUNTER — Ambulatory Visit (INDEPENDENT_AMBULATORY_CARE_PROVIDER_SITE_OTHER): Payer: 59

## 2013-10-27 ENCOUNTER — Encounter: Payer: Self-pay | Admitting: Podiatry

## 2013-10-27 VITALS — BP 122/67 | HR 75 | Resp 16

## 2013-10-27 DIAGNOSIS — R609 Edema, unspecified: Secondary | ICD-10-CM

## 2013-10-27 DIAGNOSIS — Z9889 Other specified postprocedural states: Secondary | ICD-10-CM

## 2013-10-27 DIAGNOSIS — M216X9 Other acquired deformities of unspecified foot: Secondary | ICD-10-CM

## 2013-10-27 NOTE — Progress Notes (Signed)
Subjective:     Patient ID: Brandy Maynard, female   DOB: 25-Sep-1971, 42 y.o.   MRN: [id number]  HPI patient states I'm doing well with surgery third metatarsal right with incision site well-healed and walking without discomfort at this time. 6 days after surgery   Review of Systems     Objective:   Physical Exam Neurovascular status intact with no health history changes noted and wound edges well coapted third  metatarsal right    Assessment:     Doing well post surgery right    Plan:     Reviewed x-rays and advised on continued elevation compression and immobilization. Reapplied sterile dressing reappoint 3 weeks earlier if any issues should occur

## 2013-11-10 NOTE — Progress Notes (Signed)
1) Metatarsal osteotomy 3rd met right foot

## 2013-11-17 ENCOUNTER — Ambulatory Visit (INDEPENDENT_AMBULATORY_CARE_PROVIDER_SITE_OTHER): Payer: 59

## 2013-11-17 ENCOUNTER — Ambulatory Visit (INDEPENDENT_AMBULATORY_CARE_PROVIDER_SITE_OTHER): Payer: 59 | Admitting: Podiatry

## 2013-11-17 ENCOUNTER — Encounter: Payer: Self-pay | Admitting: Podiatry

## 2013-11-17 VITALS — BP 136/81 | HR 82 | Resp 12

## 2013-11-17 DIAGNOSIS — M216X9 Other acquired deformities of unspecified foot: Secondary | ICD-10-CM

## 2013-11-17 DIAGNOSIS — Z9889 Other specified postprocedural states: Secondary | ICD-10-CM

## 2013-11-17 NOTE — Progress Notes (Signed)
Subjective:     Patient ID: Brandy Maynard, female   DOB: 27-Jan-1971, 43 y.o.   MRN: [id number]  HPI patient presents stating that it's still sore and swollen when I walk on my foot a lot right. 4 weeks after having third metatarsal   Review of Systems     Objective:   Physical Exam Neurovascular status intact with no health history changes in well-healing surgical site dorsal third metatarsal right    Assessment:     Healing well with normal edema of the right forefoot for this. Post operative    Plan:     Advised on continued compression and dispensed anklet along with continued instructions on elevation. Reappoint 4 weeks after review of x-ray

## 2013-11-28 ENCOUNTER — Other Ambulatory Visit (INDEPENDENT_AMBULATORY_CARE_PROVIDER_SITE_OTHER): Payer: 59

## 2013-11-28 DIAGNOSIS — R739 Hyperglycemia, unspecified: Secondary | ICD-10-CM

## 2013-11-28 DIAGNOSIS — R7309 Other abnormal glucose: Secondary | ICD-10-CM

## 2013-11-28 LAB — BASIC METABOLIC PANEL
BUN: 14 mg/dL (ref 6–23)
CO2: 26 mEq/L (ref 19–32)
CREATININE: 0.8 mg/dL (ref 0.4–1.2)
Calcium: 9.2 mg/dL (ref 8.4–10.5)
Chloride: 107 mEq/L (ref 96–112)
GFR: 84.93 mL/min (ref >60.00)
Glucose, Bld: 96 mg/dL (ref 70–99)
POTASSIUM: 3.9 meq/L (ref 3.5–5.1)
Sodium: 138 mEq/L (ref 135–145)

## 2013-11-28 LAB — HEMOGLOBIN A1C: HEMOGLOBIN A1C: 6.1 % (ref 4.6–6.5)

## 2013-12-05 ENCOUNTER — Ambulatory Visit (INDEPENDENT_AMBULATORY_CARE_PROVIDER_SITE_OTHER): Payer: 59 | Admitting: Internal Medicine

## 2013-12-05 ENCOUNTER — Encounter: Payer: Self-pay | Admitting: Internal Medicine

## 2013-12-05 VITALS — BP 132/82 | Temp 98.2°F | Ht 65.0 in | Wt 208.0 lb

## 2013-12-05 DIAGNOSIS — R7309 Other abnormal glucose: Secondary | ICD-10-CM

## 2013-12-05 DIAGNOSIS — E611 Iron deficiency: Secondary | ICD-10-CM

## 2013-12-05 DIAGNOSIS — R030 Elevated blood-pressure reading, without diagnosis of hypertension: Secondary | ICD-10-CM

## 2013-12-05 DIAGNOSIS — D509 Iron deficiency anemia, unspecified: Secondary | ICD-10-CM

## 2013-12-05 DIAGNOSIS — R739 Hyperglycemia, unspecified: Secondary | ICD-10-CM

## 2013-12-05 DIAGNOSIS — IMO0001 Reserved for inherently not codable concepts without codable children: Secondary | ICD-10-CM | POA: Insufficient documentation

## 2013-12-05 LAB — POCT HEMOGLOBIN: Hemoglobin: 13.9 g/dL (ref 12.2–16.2)

## 2013-12-05 NOTE — Progress Notes (Signed)
Chief Complaint  Patient presents with  . Follow-up    BG and anemia    HPI: Fu l;abs bg anemia ferritin.  Had  foot surgery  Right foot   And not yet exercises.   Not good but trying with diet and to see nutritionist today  Still heavy periods not that interested in iud poss ablation; to see gyne this spring ROS: See pertinent positives and negatives per HPI.  Past Medical History  Diagnosis Date  . Allergy   . Depression   . GERD (gastroesophageal reflux disease)   . Migraines   . Heavy menses     on ocps  . Anemia   . IBS (irritable bowel syndrome)     by hx and had a neg colonscopy  . ANEMIA 12/15/2009    Qualifier: Diagnosis of  By: Regis Bill MD, Standley Brooking     Family History  Problem Relation Age of Onset  . Arthritis Mother   . Hypertension Mother   . Cancer Mother     MELANOMA  . Hyperlipidemia Mother   . Hypertension Maternal Grandmother   . Dementia Maternal Grandmother   . Hypertension Maternal Grandfather   . Cancer Maternal Grandfather     MELANOMA  . Heart disease Paternal Grandmother   . Diabetes Paternal Grandfather   . Heart disease Paternal Grandfather   . Heart disease Father     History   Social History  . Marital Status: Married    Spouse Name: N/A    Number of Children: N/A  . Years of Education: N/A   Social History Main Topics  . Smoking status: Never Smoker   . Smokeless tobacco: Never Used  . Alcohol Use: Yes  . Drug Use: No  . Sexual Activity: Yes   Other Topics Concern  . None   Social History Narrative   Occupation: unemployed   Married   [place] of 4   Pet dog   [id number]             Outpatient Encounter Prescriptions as of 12/05/2013  Medication Sig  . cetirizine (ZYRTEC) 10 MG tablet Take 10 mg by mouth daily.  . fluticasone (FLONASE) 50 MCG/ACT nasal spray Place 2 sprays into the nose daily.  Marland Kitchen ibuprofen (ADVIL,MOTRIN) 800 MG tablet Take 1 tablet (800 mg total) by mouth every 8 (eight) hours as needed for pain.  Marland Kitchen omeprazole  (PRILOSEC OTC) 20 MG tablet Take 20 mg by mouth daily.   Marland Kitchen spironolactone (ALDACTONE) 25 MG tablet TAKE 1 TABLET EVERY DAY  . [DISCONTINUED] meperidine (DEMEROL) 50 MG tablet   . [DISCONTINUED] promethazine (PHENERGAN) 25 MG tablet     EXAM:  BP 132/82  Temp(Src) 98.2 F (36.8 C) (Oral)  Ht 5\' 5"  (1.651 m)  Wt 208 lb (94.348 kg)  BMI 34.61 kg/m2  Body mass index is 34.61 kg/(m^2).  GENERAL: vitals reviewed and listed above, alert, oriented, appears well hydrated and in no acute distress  HEENT: atraumatic, conjunctiva  clear, no obvious abnormalities on inspection of external nose and ears  NECK: no obvious masses on inspection palpatio  PSYCH: pleasant and cooperative, no obvious depression or anxiet Lab Results  Component Value Date   WBC 8.6 07/25/2013   HGB 13.9 12/05/2013   HCT 37.7 07/25/2013   PLT 267.0 07/25/2013   GLUCOSE 96 11/28/2013   CHOL 147 04/30/2013   TRIG 78 04/30/2013   HDL 45 04/30/2013   LDLCALC 86 04/30/2013   ALT 13 04/19/2012  AST 14 04/19/2012   NA 138 11/28/2013   K 3.9 11/28/2013   CL 107 11/28/2013   CREATININE 0.8 11/28/2013   BUN 14 11/28/2013   CO2 26 11/28/2013   TSH 1.45 01/10/2013   HGBA1C 6.1 11/28/2013    Wt Readings from Last 3 Encounters:  12/05/13 208 lb (94.348 kg)  10/16/13 203 lb (92.08 kg)  08/01/13 204 lb (92.534 kg)    ASSESSMENT AND PLAN:  Discussed the following assessment and plan:  Iron deficiency - Plan: POCT hemoglobin  Elevated blood pressure - Better off hormones and with lifestyle intervention  Hyperglycemia - better  -Patient advised to return or notify health care team  if symptoms worsen or persist or new concerns arise.  Patient Instructions  Continue lifestyle intervention healthy eating and exercise . weight loss  Sugar is somewhat better.  Blood pressure is better  Disc bleeding with dr Toney Rakes    Total visit 35mins > 50% spent counseling and coordinating care   Standley Brooking. Askari Kinley M.D.  Pre visit review  using our clinic review tool, if applicable. No additional management support is needed unless otherwise documented below in the visit note.

## 2013-12-05 NOTE — Patient Instructions (Signed)
Continue lifestyle intervention healthy eating and exercise . weight loss  Sugar is somewhat better.  Blood pressure is better  Disc bleeding with dr Toney Rakes

## 2013-12-15 ENCOUNTER — Encounter: Payer: Self-pay | Admitting: Podiatry

## 2013-12-15 ENCOUNTER — Ambulatory Visit (INDEPENDENT_AMBULATORY_CARE_PROVIDER_SITE_OTHER): Payer: 59

## 2013-12-15 ENCOUNTER — Ambulatory Visit (INDEPENDENT_AMBULATORY_CARE_PROVIDER_SITE_OTHER): Payer: 59 | Admitting: Podiatry

## 2013-12-15 VITALS — BP 124/83 | HR 88 | Resp 16

## 2013-12-15 DIAGNOSIS — Z9889 Other specified postprocedural states: Secondary | ICD-10-CM

## 2013-12-15 DIAGNOSIS — M216X9 Other acquired deformities of unspecified foot: Secondary | ICD-10-CM

## 2013-12-16 NOTE — Progress Notes (Signed)
Subjective:     Patient ID: Brandy Maynard, female   DOB: 19-Jan-1971, 43 y.o.   MRN: [id number]  HPI patient states I'm doing well with my right foot with occasional swelling and discomfort but much better than before surgery   Review of Systems     Objective:   Physical Exam Neurovascular status unchanged with mild edema in the forefoot right with minimal discomfort    Assessment:     Healing well from osteotomy third metatarsal right    Plan:     Reviewed x-rays and allow patient to return to activity. Reappoint her recheck in 6 weeks earlier if any issues should occur

## 2014-02-12 ENCOUNTER — Ambulatory Visit (INDEPENDENT_AMBULATORY_CARE_PROVIDER_SITE_OTHER): Payer: 59

## 2014-02-12 ENCOUNTER — Ambulatory Visit (INDEPENDENT_AMBULATORY_CARE_PROVIDER_SITE_OTHER): Payer: 59 | Admitting: Podiatry

## 2014-02-12 DIAGNOSIS — Z9889 Other specified postprocedural states: Secondary | ICD-10-CM

## 2014-02-12 DIAGNOSIS — M216X9 Other acquired deformities of unspecified foot: Secondary | ICD-10-CM

## 2014-02-13 NOTE — Progress Notes (Signed)
Subjective:     Patient ID: Brandy Maynard, female   DOB: [birth year], 43 y.o.   MRN: [id number]  HPI patient states that she is doing well but gets a small amount of swelling and numbness in her third toe that she's been on her foot a lot   Review of Systems     Objective:   Physical Exam Neurovascular status intact with significant diminishment of inflammation pain of the third MPJ right with good walking and I noted there to be a good heel toe gait pattern    Assessment:     Patient's doing well post metatarsal osteotomy right    Plan:     Reviewed final x-rays and advised on compression anti-inflammatories and that the swelling should gradually go away and reappoint for one final visit in 8 weeks earlier if necessary

## 2014-04-23 ENCOUNTER — Encounter: Payer: Self-pay | Admitting: Podiatry

## 2014-04-23 ENCOUNTER — Ambulatory Visit (INDEPENDENT_AMBULATORY_CARE_PROVIDER_SITE_OTHER): Payer: 59 | Admitting: Podiatry

## 2014-04-23 ENCOUNTER — Ambulatory Visit (INDEPENDENT_AMBULATORY_CARE_PROVIDER_SITE_OTHER): Payer: 59

## 2014-04-23 VITALS — BP 109/76 | HR 80 | Resp 12

## 2014-04-23 DIAGNOSIS — M216X9 Other acquired deformities of unspecified foot: Secondary | ICD-10-CM

## 2014-04-23 DIAGNOSIS — M722 Plantar fascial fibromatosis: Secondary | ICD-10-CM

## 2014-04-23 MED ORDER — TRIAMCINOLONE ACETONIDE 10 MG/ML IJ SUSP
10.0000 mg | Freq: Once | INTRAMUSCULAR | Status: AC
  Administered 2014-04-23: 10 mg

## 2014-04-23 NOTE — Progress Notes (Signed)
Subjective:     Patient ID: Brandy Maynard, female   DOB: 1971-07-20, 43 y.o.   MRN: [id number]  HPI patient presents stating the surgeries doing very well but I'm getting pain in my heel mostly on the outside. States that she wants to be active but she's had to reduce her walking 3-5 miles a day instead of 5-7 miles   Review of Systems     Objective:   Physical Exam Neurovascular status intact with pain in the plantar lateral aspect of the right heel with inflammation noted    Assessment:     Plantar fasciitis of the right lateral heel plantar portion    Plan:     X-rays performed and discussed and injected the lateral heel 3 mg Kenalog 5 mg like Marcaine mixture. I reviewed how well the fourth metatarsal was done and she is discharged from that surgery

## 2014-05-06 ENCOUNTER — Encounter: Payer: Self-pay | Admitting: Gynecology

## 2014-05-18 ENCOUNTER — Encounter: Payer: Self-pay | Admitting: Gynecology

## 2014-05-18 ENCOUNTER — Ambulatory Visit (INDEPENDENT_AMBULATORY_CARE_PROVIDER_SITE_OTHER): Payer: 59 | Admitting: Gynecology

## 2014-05-18 VITALS — BP 128/86

## 2014-05-18 DIAGNOSIS — L293 Anogenital pruritus, unspecified: Secondary | ICD-10-CM

## 2014-05-18 DIAGNOSIS — L292 Pruritus vulvae: Secondary | ICD-10-CM

## 2014-05-18 MED ORDER — FLUCONAZOLE 150 MG PO TABS: 150.0000 mg | ORAL_TABLET | Freq: Once | ORAL | Status: DC

## 2014-05-18 MED ORDER — CLOBETASOL PROPIONATE 0.05 % EX CREA: 1.0000 "application " | TOPICAL_CREAM | Freq: Two times a day (BID) | CUTANEOUS | Status: DC

## 2014-05-18 NOTE — Progress Notes (Signed)
   Patient presented to the office today stating that for the past few days she been complaining of vulvar pruritus and she had a Diflucan at home which she took and also took over-the-counter vaginal and continues to have same symptoms. Patient is currently menstruating. Patient is in a monogamous relationship and has had a tubal ligation in the past.  Exam: Bartholin urethra Skene was within normal limits slight redness irritation on the external genitalia but no discernible lesion. Speculum exam demonstrates a menstrual blood present no gross lesions on inspection.  A wet prep was done which demonstrated no abnormalities few bacteria at most.  Assessment/plan: Nonspecific dermatitis of the external vulva. Patient had used Vagisil for several days and took a Diflucan she may have treated herself completely but has been irritated from the topical application of the Vagisil suture used for quite a while. I'm going to place her on clobetasol 0.05% to apply twice a day for 7-to 10 days. Since she is going on a trip on going to give her a prescription for Diflucan 150 mg to take one by mouth when necessary. She is scheduled for annual exam the next few weeks. She was instructed to refrain from any perfumes or count and to use cotton underwear and no intercourse until it clears up.

## 2014-05-18 NOTE — Patient Instructions (Signed)
Clobetasol Propionate skin cream What is this medicine? CLOBETASOL (kloe BAY ta sol) is a corticosteroid. It is used on the skin to treat itching, redness, and swelling caused by some skin conditions. This medicine may be used for other purposes; ask your health care provider or pharmacist if you have questions. COMMON BRAND NAME(S): Cormax, Embeline, Embeline E, Temovate, Temovate E What should I tell my health care provider before I take this medicine? They need to know if you have any of these conditions: -any type of active infection including measles, tuberculosis, herpes, or chickenpox -circulation problems or vascular disease -large areas of burned or damaged skin -rosacea -skin wasting or thinning -an unusual or allergic reaction to clobetasol, corticosteroids, other medicines, foods, dyes, or preservatives -pregnant or trying to get pregnant -breast-feeding How should I use this medicine? This medicine is for external use only. Do not take by mouth. Follow the directions on the prescription label. Wash your hands before and after use. Apply a thin film of medicine to the affected area. Do not cover with a bandage or dressing unless your doctor or health care professional tells you to. Do not get this medicine in your eyes. If you do, rinse out with plenty of cool tap water. It is important not to use more medicine than prescribed. Do not use your medicine more often than directed. To do so may increase the chance of side effects. Talk to your pediatrician regarding the use of this medicine in children. Special care may be needed. Elderly patients are more likely to have damaged skin through aging, and this may increase side effects. This medicine should only be used for brief periods and infrequently in older patients. Overdosage: If you think you have taken too much of this medicine contact a poison control center or emergency room at once. NOTE: This medicine is only for you. Do not  share this medicine with others. What if I miss a dose? If you miss a dose, use it as soon as you can. If it is almost time for your next dose, use only that dose. Do not use double or extra doses. What may interact with this medicine? Interactions are not expected. Do not use cosmetics or other skin care products on the treated area. This list may not describe all possible interactions. Give your health care provider a list of all the medicines, herbs, non-prescription drugs, or dietary supplements you use. Also tell them if you smoke, drink alcohol, or use illegal drugs. Some items may interact with your medicine. What should I watch for while using this medicine? Tell your doctor or health care professional if your symptoms do not get better within 2 weeks, or if you develop skin irritation from the medicine. Tell your doctor or health care professional if you are exposed to anyone with measles or chickenpox, or if you develop sores or blisters that do not heal properly. What side effects may I notice from receiving this medicine? Side effects that you should report to your doctor or health care professional as soon as possible: -allergic reactions like skin rash, itching or hives, swelling of the face, lips, or tongue -changes in vision -lack of healing of the skin condition -painful, red, pus filled blisters on the skin or in hair follicles -thinning of the skin with easy bruising Side effects that usually do not require medical attention (report to your doctor or health care professional if they continue or are bothersome): -burning, irritation of the skin -redness or   scaling of the skin This list may not describe all possible side effects. Call your doctor for medical advice about side effects. You may report side effects to FDA at 1-800-FDA-1088. Where should I keep my medicine? Keep out of the reach of children. Store at room temperature between 15 and 30 degrees C (59 and 86 degrees  F). Keep away from heat and direct light. Do not freeze. Throw away any unused medicine after the expiration date. NOTE: This sheet is a summary. It may not cover all possible information. If you have questions about this medicine, talk to your doctor, pharmacist, or health care provider.  2015, Elsevier/Gold Standard. (2008-01-22 16:56:45)  

## 2014-05-19 LAB — WET PREP FOR TRICH, YEAST, CLUE
CLUE CELLS WET PREP: NONE SEEN
Trich, Wet Prep: NONE SEEN
WBC, Wet Prep HPF POC: NONE SEEN
YEAST WET PREP: NONE SEEN

## 2014-05-19 NOTE — Addendum Note (Signed)
Addended by: Thurnell Garbe A on: 05/19/2014 08:25 AM   Modules accepted: Orders

## 2014-05-28 ENCOUNTER — Other Ambulatory Visit (INDEPENDENT_AMBULATORY_CARE_PROVIDER_SITE_OTHER): Payer: 59

## 2014-05-28 DIAGNOSIS — R7309 Other abnormal glucose: Secondary | ICD-10-CM

## 2014-05-28 LAB — BASIC METABOLIC PANEL
BUN: 18 mg/dL (ref 6–23)
CALCIUM: 9.1 mg/dL (ref 8.4–10.5)
CHLORIDE: 107 meq/L (ref 96–112)
CO2: 27 mEq/L (ref 19–32)
CREATININE: 0.8 mg/dL (ref 0.4–1.2)
GFR: 83.51 mL/min (ref >60.00)
Glucose, Bld: 101 mg/dL — ABNORMAL HIGH (ref 70–99)
Potassium: 3.8 mEq/L (ref 3.5–5.1)
Sodium: 139 mEq/L (ref 135–145)

## 2014-05-28 LAB — HEMOGLOBIN A1C: HEMOGLOBIN A1C: 6.1 % (ref 4.6–6.5)

## 2014-06-02 ENCOUNTER — Other Ambulatory Visit (HOSPITAL_COMMUNITY)
Admission: RE | Admit: 2014-06-02 | Discharge: 2014-06-02 | Disposition: A | Discharge status: Home / Self Care | Payer: 59 | Source: Ambulatory Visit | Attending: Gynecology | Admitting: Gynecology

## 2014-06-02 ENCOUNTER — Encounter: Payer: Self-pay | Admitting: Gynecology

## 2014-06-02 ENCOUNTER — Ambulatory Visit (INDEPENDENT_AMBULATORY_CARE_PROVIDER_SITE_OTHER): Payer: 59 | Admitting: Gynecology

## 2014-06-02 VITALS — BP 120/86 | Ht 65.0 in | Wt 218.0 lb

## 2014-06-02 DIAGNOSIS — Z1151 Encounter for screening for human papillomavirus (HPV): Secondary | ICD-10-CM | POA: Diagnosis present

## 2014-06-02 DIAGNOSIS — Z01419 Encounter for gynecological examination (general) (routine) without abnormal findings: Secondary | ICD-10-CM | POA: Diagnosis present

## 2014-06-02 DIAGNOSIS — R635 Abnormal weight gain: Secondary | ICD-10-CM

## 2014-06-02 DIAGNOSIS — N920 Excessive and frequent menstruation with regular cycle: Secondary | ICD-10-CM

## 2014-06-02 LAB — LIPID PANEL
Cholesterol: 144 mg/dL (ref 0–200)
HDL: 54 mg/dL (ref >39)
LDL CALC: 77 mg/dL (ref 0–99)
Total CHOL/HDL Ratio: 2.7 Ratio
Triglycerides: 66 mg/dL (ref <150)
VLDL: 13 mg/dL (ref 0–40)

## 2014-06-02 NOTE — Patient Instructions (Signed)
Endometrial Ablation Endometrial ablation removes the lining of the uterus (endometrium). It is usually a same-day, outpatient treatment. Ablation helps avoid major surgery, such as surgery to remove the cervix and uterus (hysterectomy). After endometrial ablation, you will have little or no menstrual bleeding and may not be able to have children. However, if you are premenopausal, you will need to use a reliable method of birth control following the procedure because of the small chance that pregnancy can occur. There are different reasons to have this procedure, which include:  Heavy periods.  Bleeding that is causing anemia.  Irregular bleeding.  Bleeding fibroids on the lining inside the uterus if they are smaller than 3 centimeters. This procedure should not be done if:  You want children in the future.  You have severe cramps with your menstrual period.  You have precancerous or cancerous cells in your uterus.  You were recently pregnant.  You have gone through menopause.  You have had major surgery on the uterus, such as a cesarean delivery. LET YOUR HEALTH CARE PROVIDER KNOW ABOUT:  Any allergies you have.  All medicines you are taking, including vitamins, herbs, eye drops, creams, and over-the-counter medicines.  Previous problems you or members of your family have had with the use of anesthetics.  Any blood disorders you have.  Previous surgeries you have had.  Medical conditions you have. RISKS AND COMPLICATIONS  Generally, this is a safe procedure. However, as with any procedure, complications can occur. Possible complications include:  Perforation of the uterus.  Bleeding.  Infection of the uterus, bladder, or vagina.  Injury to surrounding organs.  An air bubble to the lung (air embolus).  Pregnancy following the procedure.  Failure of the procedure to help the problem, requiring hysterectomy.  Decreased ability to diagnose cancer in the lining of  the uterus. BEFORE THE PROCEDURE  The lining of the uterus must be tested to make sure there is no pre-cancerous or cancer cells present.  An ultrasound may be performed to look at the size of the uterus and to check for abnormalities.  Medicines may be given to thin the lining of the uterus. PROCEDURE  During the procedure, your health care provider will use a tool called a resectoscope to help see inside your uterus. There are different ways to remove the lining of your uterus.   Radiofrequency - This method uses a radiofrequency-alternating electric current to remove the lining of the uterus.  Cryotherapy - This method uses extreme cold to freeze the lining of the uterus.  Heated-Free Liquid - This method uses heated salt (saline) solution to remove the lining of the uterus.  Microwave - This method uses high-energy microwaves to heat up the lining of the uterus to remove it.  Thermal balloon - This method involves inserting a catheter with a balloon tip into the uterus. The balloon tip is filled with heated fluid to remove the lining of the uterus. AFTER THE PROCEDURE  After your procedure, do not have sexual intercourse or insert anything into your vagina until permitted by your health care provider. After the procedure, you may experience:  Cramps.  Vaginal discharge.  Frequent urination. Document Released: 08/25/2004 Document Revised: 06/18/2013 Document Reviewed: 03/19/2013 ExitCare Patient Information 2015 [employer]. This information is not intended to replace advice given to you by your health care provider. Make sure you discuss any questions you have with your health care provider.  

## 2014-06-02 NOTE — Progress Notes (Signed)
Brandy Maynard [birth year] [id number]   History:    43 y.o.  for annual gyn exam whose only complaint is that her cycles occur every 21-26 days and last 4-5 days for very heavy and painful. She has had tubal ligation in the past. Wanted to discuss Lowman IUD or ablation. Patient had right foot surgery this December. Patient with no prior history of abnormal Pap smear. Her PCP is Dr. Regis Bill who has been doing most of her lab work.  Past medical history,surgical history, family history and social history were all reviewed and documented in the EPIC chart.  Gynecologic History Patient's last menstrual period was 05/18/2014. Contraception: tubal ligation Last Pap: 2012. Results were: normal Last mammogram: July 2015. Results were: normal  Obstetric History OB History  Gravida Para Term Preterm AB SAB TAB Ectopic Multiple Living  3 2 2  1 1    2     # Outcome Date GA Lbr Len/2nd Weight Sex Delivery Anes PTL Lv  3 SAB           2 TRM     F SVD  N Y  1 TRM     F SVD  N Y       ROS: A ROS was performed and pertinent positives and negatives are included in the history.  GENERAL: No fevers or chills. HEENT: No change in vision, no earache, sore throat or sinus congestion. NECK: No pain or stiffness. CARDIOVASCULAR: No chest pain or pressure. No palpitations. PULMONARY: No shortness of breath, cough or wheeze. GASTROINTESTINAL: No abdominal pain, nausea, vomiting or diarrhea, melena or bright red blood per rectum. GENITOURINARY: No urinary frequency, urgency, hesitancy or dysuria. MUSCULOSKELETAL: No joint or muscle pain, no back pain, no recent trauma. DERMATOLOGIC: No rash, no itching, no lesions. ENDOCRINE: No polyuria, polydipsia, no heat or cold intolerance. No recent change in weight. HEMATOLOGICAL: No anemia or easy bruising or bleeding. NEUROLOGIC: No headache, seizures, numbness, tingling or weakness. PSYCHIATRIC: No depression, no loss of interest in normal activity or change in sleep  pattern.     Exam: chaperone present  BP 120/86  Ht 5\' 5"  (1.651 m)  Wt 218 lb (98.884 kg)  BMI 36.28 kg/m2  LMP 05/18/2014  Body mass index is 36.28 kg/(m^2).  General appearance : Well developed well nourished female. No acute distress HEENT: Neck supple, trachea midline, no carotid bruits, no thyroidmegaly Lungs: Clear to auscultation, no rhonchi or wheezes, or rib retractions  Heart: Regular rate and rhythm, no murmurs or gallops Breast:Examined in sitting and supine position were symmetrical in appearance, no palpable masses or tenderness,  no skin retraction, no nipple inversion, no nipple discharge, no skin discoloration, no axillary or supraclavicular lymphadenopathy Abdomen: no palpable masses or tenderness, no rebound or guarding Extremities: no edema or skin discoloration or tenderness  Pelvic:  Bartholin, Urethra, Skene Glands: Within normal limits             Vagina: No gross lesions or discharge  Cervix: No gross lesions or discharge  Uterus  anteverted, normal size, shape and consistency, non-tender and mobile  Adnexa  Without masses or tenderness  Anus and perineum  normal   Rectovaginal  normal sphincter tone without palpated masses or tenderness             Hemoccult not indicated     Assessment/Plan:  43 y.o. female for annual exam was given information on the Mirena IUD as well as on the her option endometrial ablation.  We'll check her insurance coverage and scheduled accordingly based on her decision. A fasting lipid profile TSH and Pap smear were obtained today. She was reminded to do her monthly breast exam. We discussed the importance of calcium and vitamin D for osteoporosis prevention.  Note: This dictation was prepared with  Dragon/digital dictation along withSmart phrase technology. Any transcriptional errors that result from this process are unintentional.   Terrance Mass MD, 9:49 AM 06/02/2014

## 2014-06-03 ENCOUNTER — Telehealth: Payer: Self-pay

## 2014-06-03 LAB — CYTOLOGY - PAP

## 2014-06-03 LAB — TSH: TSH: 3.011 u[IU]/mL (ref 0.350–4.500)

## 2014-06-03 NOTE — Telephone Encounter (Signed)
At Dr. Durenda Guthrie request I contacted patient after checking her insurance benefits for [facility] and Her Option Ablation.  Patient's financial responsibility discussed.  She said this may have to wait a few mos as she has some college related expenses coming up for her child.  She has my direct phone number and will call me when she is ready to schedule.

## 2014-06-04 ENCOUNTER — Ambulatory Visit (INDEPENDENT_AMBULATORY_CARE_PROVIDER_SITE_OTHER): Payer: 59 | Admitting: Internal Medicine

## 2014-06-04 ENCOUNTER — Encounter: Payer: Self-pay | Admitting: Internal Medicine

## 2014-06-04 VITALS — BP 110/80 | Temp 98.8°F | Ht 65.0 in | Wt 216.0 lb

## 2014-06-04 DIAGNOSIS — R7309 Other abnormal glucose: Secondary | ICD-10-CM

## 2014-06-04 DIAGNOSIS — Z6835 Body mass index (BMI) 35.0-35.9, adult: Secondary | ICD-10-CM

## 2014-06-04 DIAGNOSIS — R739 Hyperglycemia, unspecified: Secondary | ICD-10-CM

## 2014-06-04 NOTE — Patient Instructions (Signed)
Intensify lifestyle interventions. Labs are stable.

## 2014-06-04 NOTE — Progress Notes (Signed)
Pre visit review using our clinic review tool, if applicable. No additional management support is needed unless otherwise documented below in the visit note.  Chief Complaint  Patient presents with  . Follow-up    bg    HPI: Brandy Maynard Fu up of hyperglycemia  Has seen her gyne dr  Struggling with consistency.    Many changes.   Back to work better. Schedule   Has a sweet tooth.   This is the issue.   Breadsalso  and cheese. Difficulties.  No sx of  Numbness diabetes.  Did w watches in past and lost weight but didn't keep off after 4 months but didn't keep going.  ROS: See pertinent positives and negatives per HPI.  Past Medical History  Diagnosis Date  . Allergy   . Depression   . GERD (gastroesophageal reflux disease)   . Migraines   . Heavy menses     on ocps  . Anemia   . IBS (irritable bowel syndrome)     by hx and had a neg colonscopy  . ANEMIA 12/15/2009    Qualifier: Diagnosis of  By: Regis Bill MD, Standley Brooking     Family History  Problem Relation Age of Onset  . Arthritis Mother   . Hypertension Mother   . Cancer Mother     MELANOMA  . Hyperlipidemia Mother   . Hypertension Maternal Grandmother   . Dementia Maternal Grandmother   . Hypertension Maternal Grandfather   . Cancer Maternal Grandfather     MELANOMA  . Heart disease Paternal Grandmother   . Diabetes Paternal Grandfather   . Heart disease Paternal Grandfather   . Heart disease Father     History   Social History  . Marital Status: Married    Spouse Name: N/A    Number of Children: N/A  . Years of Education: N/A   Social History Main Topics  . Smoking status: Never Smoker   . Smokeless tobacco: Never Used  . Alcohol Use: Yes  . Drug Use: No  . Sexual Activity: Yes   Other Topics Concern  . None   Social History Narrative   Occupation: unemployed   Married   [place] of 4   Pet dog   [id number]             Outpatient Encounter Prescriptions as of 06/04/2014  Medication Sig  . cetirizine  (ZYRTEC) 10 MG tablet Take 10 mg by mouth daily.  Marland Kitchen ibuprofen (ADVIL,MOTRIN) 800 MG tablet Take 1 tablet (800 mg total) by mouth every 8 (eight) hours as needed for pain.  Marland Kitchen omeprazole (PRILOSEC OTC) 20 MG tablet Take 20 mg by mouth daily.   Marland Kitchen spironolactone (ALDACTONE) 25 MG tablet TAKE 1 TABLET EVERY DAY  . [DISCONTINUED] clobetasol cream (TEMOVATE) 7.25 % Apply 1 application topically 2 (two) times daily. Apply bid for 7-1- days  . [DISCONTINUED] fluconazole (DIFLUCAN) 150 MG tablet Take 1 tablet (150 mg total) by mouth once.  . [DISCONTINUED] fluticasone (FLONASE) 50 MCG/ACT nasal spray Place 2 sprays into the nose daily.    EXAM:  BP 110/80  Temp(Src) 98.8 F (37.1 C) (Oral)  Ht 5\' 5"  (1.651 m)  Wt 216 lb (97.977 kg)  BMI 35.94 kg/m2  LMP 05/18/2014  Body mass index is 35.94 kg/(m^2).  GENERAL: vitals reviewed and listed above, alert, oriented, appears well hydrated and in no acute distress HEENT: atraumatic, conjunctiva  clear, no obvious abnormalities on inspection of external nose and ears OP :  no lesion edema or exudate  NECK: no obvious masses on inspection palpation  LUNGS: clear to auscultation bilaterally, no wheezes, rales or rhonchi, good air movement CV: HRRR, no clubbing cyanosis or  peripheral edema nl cap refill  MS: moves all extremities without noticeable focal  abnormality PSYCH: pleasant and cooperative, no obvious depression or anxiety Lab Results  Component Value Date   WBC 8.6 07/25/2013   HGB 13.9 12/05/2013   HCT 37.7 07/25/2013   PLT 267.0 07/25/2013   GLUCOSE 101* 05/28/2014   CHOL 144 06/02/2014   TRIG 66 06/02/2014   HDL 54 06/02/2014   LDLCALC 77 06/02/2014   ALT 13 04/19/2012   AST 14 04/19/2012   NA 139 05/28/2014   K 3.8 05/28/2014   CL 107 05/28/2014   CREATININE 0.8 05/28/2014   BUN 18 05/28/2014   CO2 27 05/28/2014   TSH 3.011 06/02/2014   HGBA1C 6.1 05/28/2014   BP Readings from Last 3 Encounters:  06/04/14 110/80  06/02/14 120/86  05/18/14 128/86     Wt Readings from Last 3 Encounters:  06/04/14 216 lb (97.977 kg)  06/02/14 218 lb (98.884 kg)  12/05/13 208 lb (94.348 kg)    ASSESSMENT AND PLAN:  Discussed the following assessment and plan:  Hyperglycemia - borderline   follwo stable lsi  BMI 35.0-35.9,adult Plan wellness with labs in 6 months and a1c  -Patient advised to return or notify health care team  if symptoms worsen ,persist or new concerns arise.  Patient Instructions  Intensify lifestyle interventions. Labs are stable.        Standley Brooking. Panosh M.D. Total visit 19mins > 50% spent counseling and coordinating care

## 2014-07-12 ENCOUNTER — Other Ambulatory Visit: Payer: Self-pay | Admitting: Gynecology

## 2014-08-31 ENCOUNTER — Encounter: Payer: Self-pay | Admitting: Internal Medicine

## 2014-10-15 ENCOUNTER — Encounter: Payer: Self-pay | Admitting: Podiatry

## 2014-10-15 ENCOUNTER — Ambulatory Visit (INDEPENDENT_AMBULATORY_CARE_PROVIDER_SITE_OTHER): Payer: 59 | Admitting: Podiatry

## 2014-10-15 VITALS — BP 120/75 | HR 76

## 2014-10-15 DIAGNOSIS — B351 Tinea unguium: Secondary | ICD-10-CM

## 2014-10-15 DIAGNOSIS — M722 Plantar fascial fibromatosis: Secondary | ICD-10-CM

## 2014-10-15 MED ORDER — TRIAMCINOLONE ACETONIDE 10 MG/ML IJ SUSP
10.0000 mg | Freq: Once | INTRAMUSCULAR | Status: AC
  Administered 2014-10-15: 10 mg

## 2014-10-18 NOTE — Progress Notes (Signed)
Subjective:     Patient ID: Brandy Maynard, female   DOB: Jan 24, 1971, 43 y.o.   MRN: [id number]  HPI patient is found to have pain in the lateral side of the right foot with inflammation and fluid buildup noted. States that the area we did the surgery is doing very well and she is also concerned about discoloration in her left big toenail which has occurred recently   Review of Systems     Objective:   Physical Exam Neurovascular status intact with muscle strength adequate and range of motion subtalar and midtarsal joint within normal limits. Patient is noted to have lateral foot inflammation with fluid buildup that's painful secondary to probable gait change and is noted to have a discolored left hallux nail distal one half of the bed with yellow numbness and mild looseness of the underlying nail     Assessment:     Probable compensatory tendinitis right and probable trauma to the left hallux nail with possible mycotic type infection    Plan:     H&P and conditions discussed and careful injection administered lateral aspect right foot 3 mg Kenalog 5 mg Xylocaine and discussed nail bed and allowing it to grow out and did place on formula 3 to try to reduce fungal infiltration. Reappoint to recheck

## 2014-10-20 ENCOUNTER — Telehealth: Payer: Self-pay

## 2014-10-20 NOTE — Telephone Encounter (Signed)
Pt called regarding a medication that the [facility] office was out of at the moment. She stated that the office manager was supposed to get the medication from another office and call her when done

## 2014-10-26 NOTE — Telephone Encounter (Signed)
Medication was for Formula 3

## 2014-10-26 NOTE — Telephone Encounter (Signed)
Called patient-said no one had called her regarding the medication she needed. Will call patient once we receive into the office for pickup.

## 2014-11-02 NOTE — Telephone Encounter (Signed)
Call patient-let her know the Formula # is in and the charge for it

## 2014-11-27 ENCOUNTER — Other Ambulatory Visit (INDEPENDENT_AMBULATORY_CARE_PROVIDER_SITE_OTHER): Payer: BLUE CROSS/BLUE SHIELD

## 2014-11-27 DIAGNOSIS — R7309 Other abnormal glucose: Secondary | ICD-10-CM

## 2014-11-27 LAB — HEMOGLOBIN A1C: Hgb A1c MFr Bld: 6.2 % (ref 4.6–6.5)

## 2014-12-04 ENCOUNTER — Encounter: Payer: Self-pay | Admitting: Internal Medicine

## 2014-12-04 ENCOUNTER — Ambulatory Visit (INDEPENDENT_AMBULATORY_CARE_PROVIDER_SITE_OTHER): Payer: BLUE CROSS/BLUE SHIELD | Admitting: Internal Medicine

## 2014-12-04 VITALS — BP 112/82 | Temp 98.6°F | Ht 65.0 in | Wt 219.5 lb

## 2014-12-04 DIAGNOSIS — R739 Hyperglycemia, unspecified: Secondary | ICD-10-CM

## 2014-12-04 DIAGNOSIS — Z6835 Body mass index (BMI) 35.0-35.9, adult: Secondary | ICD-10-CM

## 2014-12-04 NOTE — Patient Instructions (Signed)
Sugar is stable .   Get back on  Healthy  Diet and exercise .      Why follow it? Research shows. . Those who follow the Mediterranean diet have a reduced risk of heart disease  . The diet is associated with a reduced incidence of Parkinson's and Alzheimer's diseases . People following the diet may have longer life expectancies and lower rates of chronic diseases  . The Dietary Guidelines for Americans recommends the Mediterranean diet as an eating plan to promote health and prevent disease  What Is the Mediterranean Diet?  . Healthy eating plan based on typical foods and recipes of Mediterranean-style cooking . The diet is primarily a plant based diet; these foods should make up a majority of meals   Starches - Plant based foods should make up a majority of meals - They are an important sources of vitamins, minerals, energy, antioxidants, and fiber - Choose whole grains, foods high in fiber and minimally processed items  - Typical grain sources include wheat, oats, barley, corn, brown rice, bulgar, farro, millet, polenta, couscous  - Various types of beans include chickpeas, lentils, fava beans, black beans, white beans   Fruits  Veggies - Large quantities of antioxidant rich fruits & veggies; 6 or more servings  - Vegetables can be eaten raw or lightly drizzled with oil and cooked  - Vegetables common to the traditional Mediterranean Diet include: artichokes, arugula, beets, broccoli, brussel sprouts, cabbage, carrots, celery, collard greens, cucumbers, eggplant, kale, leeks, lemons, lettuce, mushrooms, okra, onions, peas, peppers, potatoes, pumpkin, radishes, rutabaga, shallots, spinach, sweet potatoes, turnips, zucchini - Fruits common to the Mediterranean Diet include: apples, apricots, avocados, cherries, clementines, dates, figs, grapefruits, grapes, melons, nectarines, oranges, peaches, pears, pomegranates, strawberries, tangerines  Fats - Replace butter and margarine with healthy  oils, such as olive oil, canola oil, and tahini  - Limit nuts to no more than a handful a day  - Nuts include walnuts, almonds, pecans, pistachios, pine nuts  - Limit or avoid candied, honey roasted or heavily salted nuts - Olives are central to the [employer] - can be eaten whole or used in a variety of dishes   Meats Protein - Limiting red meat: no more than a few times a month - When eating red meat: choose lean cuts and keep the portion to the size of deck of cards - Eggs: approx. 0 to 4 times a week  - Fish and lean poultry: at least 2 a week  - Healthy protein sources include, chicken, [place], lean beef, lamb - Increase intake of seafood such as tuna, salmon, trout, mackerel, shrimp, scallops - Avoid or limit high fat processed meats such as sausage and bacon  Dairy - Include moderate amounts of low fat dairy products  - Focus on healthy dairy such as fat free yogurt, skim milk, low or reduced fat cheese - Limit dairy products higher in fat such as whole or 2% milk, cheese, ice cream  Alcohol - Moderate amounts of red wine is ok  - No more than 5 oz daily for women (all ages) and men older than age 35  - No more than 10 oz of wine daily for men younger than 42  Other - Limit sweets and other desserts  - Use herbs and spices instead of salt to flavor foods  - Herbs and spices common to the traditional Mediterranean Diet include: basil, bay leaves, chives, cloves, cumin, fennel, garlic, lavender, marjoram, mint, oregano, parsley, pepper, rosemary,  sage, savory, sumac, tarragon, thyme   It's not just a diet, it's a lifestyle:  . The Mediterranean diet includes lifestyle factors typical of those in the region  . Foods, drinks and meals are best eaten with others and savored . Daily physical activity is important for overall good health . This could be strenuous exercise like running and aerobics . This could also be more leisurely activities such as walking, housework, yard-work,  or taking the stairs . Moderation is the key; a balanced and healthy diet accommodates most foods and drinks . Consider portion sizes and frequency of consumption of certain foods   Meal Ideas & Options:  . Breakfast:  o Whole wheat toast or whole wheat English muffins with peanut butter & hard boiled egg o Steel cut oats topped with apples & cinnamon and skim milk  o Fresh fruit: banana, strawberries, melon, berries, peaches  o Smoothies: strawberries, bananas, greek yogurt, peanut butter o Low fat greek yogurt with blueberries and granola  o Egg white omelet with spinach and mushrooms o Breakfast couscous: whole wheat couscous, apricots, skim milk, cranberries  . Sandwiches:  o Hummus and grilled vegetables (peppers, zucchini, squash) on whole wheat bread   o Grilled chicken on whole wheat pita with lettuce, tomatoes, cucumbers or tzatziki  o Tuna salad on whole wheat bread: tuna salad made with greek yogurt, olives, red peppers, capers, green onions o Garlic rosemary lamb pita: lamb sauted with garlic, rosemary, salt & pepper; add lettuce, cucumber, greek yogurt to pita - flavor with lemon juice and black pepper  . Seafood:  o Mediterranean grilled salmon, seasoned with garlic, basil, parsley, lemon juice and black pepper o Shrimp, lemon, and spinach whole-grain pasta salad made with low fat greek yogurt  o Seared scallops with lemon orzo  o Seared tuna steaks seasoned salt, pepper, coriander topped with tomato mixture of olives, tomatoes, olive oil, minced garlic, parsley, green onions and cappers  . Meats:  o Herbed greek chicken salad with kalamata olives, cucumber, feta  o Red bell peppers stuffed with spinach, bulgur, lean ground beef (or lentils) & topped with feta   o Kebabs: skewers of chicken, tomatoes, onions, zucchini, squash  o [place] burgers: made with red onions, mint, dill, lemon juice, feta cheese topped with roasted red peppers . Vegetarian o Cucumber salad:  cucumbers, artichoke hearts, celery, red onion, feta cheese, tossed in olive oil & lemon juice  o Hummus and whole grain pita points with a greek salad (lettuce, tomato, feta, olives, cucumbers, red onion) o Lentil soup with celery, carrots made with vegetable broth, garlic, salt and pepper  o Tabouli salad: parsley, bulgur, mint, scallions, cucumbers, tomato, radishes, lemon juice, olive oil, salt and pepper.

## 2014-12-04 NOTE — Progress Notes (Signed)
Pre visit review using our clinic review tool, if applicable. No additional management support is needed unless otherwise documented below in the visit note.  Chief Complaint  Patient presents with  . Follow-up    HPI: Brandy Maynard 44 y.o.  No sig sugar beverages.  X mas  relapesed on sugars  Back on attending to eating  Work stress  Gm recently passed.  Bleeding  Considering  mirena ablation ROS: See pertinent positives and negatives per HPI.  Past Medical History  Diagnosis Date  . Allergy   . Depression   . GERD (gastroesophageal reflux disease)   . Migraines   . Heavy menses     on ocps  . Anemia   . IBS (irritable bowel syndrome)     by hx and had a neg colonscopy  . ANEMIA 12/15/2009    Qualifier: Diagnosis of  By: Regis Bill MD, Standley Brooking     Family History  Problem Relation Age of Onset  . Arthritis Mother   . Hypertension Mother   . Cancer Mother     MELANOMA  . Hyperlipidemia Mother   . Hypertension Maternal Grandmother   . Dementia Maternal Grandmother   . Hypertension Maternal Grandfather   . Cancer Maternal Grandfather     MELANOMA  . Heart disease Paternal Grandmother   . Diabetes Paternal Grandfather   . Heart disease Paternal Grandfather   . Heart disease Father     History   Social History  . Marital Status: Married    Spouse Name: N/A    Number of Children: N/A  . Years of Education: N/A   Social History Main Topics  . Smoking status: Never Smoker   . Smokeless tobacco: Never Used  . Alcohol Use: Yes  . Drug Use: No  . Sexual Activity: Yes   Other Topics Concern  . None   Social History Narrative   Occupation: unemployed   Married   [place] of 4   Pet dog   [id number]             Outpatient Encounter Prescriptions as of 12/04/2014  Medication Sig  . cetirizine (ZYRTEC) 10 MG tablet Take 10 mg by mouth daily.  Marland Kitchen ibuprofen (ADVIL,MOTRIN) 800 MG tablet TAKE 1 TABLET BY MOUTH EVERY 8 HOURS AS NEEDED FOR PAIN  . omeprazole (PRILOSEC OTC)  20 MG tablet Take 20 mg by mouth daily.   Marland Kitchen spironolactone (ALDACTONE) 25 MG tablet TAKE 1 TABLET EVERY DAY    EXAM:  BP 112/82 mmHg  Temp(Src) 98.6 F (37 C) (Oral)  Ht 5\' 5"  (1.651 m)  Wt 219 lb 8 oz (99.565 kg)  BMI 36.53 kg/m2  Body mass index is 36.53 kg/(m^2).  GENERAL: vitals reviewed and listed above, alert, oriented, appears well hydrated and in no acute distress MS: moves all extremities without noticeable focal  abnormality PSYCH: pleasant and cooperative,  Lab Results  Component Value Date   WBC 8.6 07/25/2013   HGB 13.9 12/05/2013   HCT 37.7 07/25/2013   PLT 267.0 07/25/2013   GLUCOSE 101* 05/28/2014   CHOL 144 06/02/2014   TRIG 66 06/02/2014   HDL 54 06/02/2014   LDLCALC 77 06/02/2014   ALT 13 04/19/2012   AST 14 04/19/2012   NA 139 05/28/2014   K 3.8 05/28/2014   CL 107 05/28/2014   CREATININE 0.8 05/28/2014   BUN 18 05/28/2014   CO2 27 05/28/2014   TSH 3.011 06/02/2014   HGBA1C 6.2 11/27/2014   Wt  Readings from Last 3 Encounters:  12/04/14 219 lb 8 oz (99.565 kg)  06/04/14 216 lb (97.977 kg)  06/02/14 218 lb (98.884 kg)   BP Readings from Last 3 Encounters:  12/04/14 112/82  10/15/14 120/75  06/04/14 110/80     ASSESSMENT AND PLAN:  Discussed the following assessment and plan:  Hyperglycemia - stable declined metform at this time counsed followed restart tracking  BMI 35.0-35.9,adult cpx labs a1c in 6 months Counseled.   15 minutes  Declined flu vaccine late in season -Patient advised to return or notify health care team  if symptoms worsen ,persist or new concerns arise.  Patient Instructions   Sugar is stable .   Get back on  Healthy  Diet and exercise .      Why follow it? Research shows. . Those who follow the Mediterranean diet have a reduced risk of heart disease  . The diet is associated with a reduced incidence of Parkinson's and Alzheimer's diseases . People following the diet may have longer life expectancies and  lower rates of chronic diseases  . The Dietary Guidelines for Americans recommends the Mediterranean diet as an eating plan to promote health and prevent disease  What Is the Mediterranean Diet?  . Healthy eating plan based on typical foods and recipes of Mediterranean-style cooking . The diet is primarily a plant based diet; these foods should make up a majority of meals   Starches - Plant based foods should make up a majority of meals - They are an important sources of vitamins, minerals, energy, antioxidants, and fiber - Choose whole grains, foods high in fiber and minimally processed items  - Typical grain sources include wheat, oats, barley, corn, brown rice, bulgar, farro, millet, polenta, couscous  - Various types of beans include chickpeas, lentils, fava beans, black beans, white beans   Fruits  Veggies - Large quantities of antioxidant rich fruits & veggies; 6 or more servings  - Vegetables can be eaten raw or lightly drizzled with oil and cooked  - Vegetables common to the traditional Mediterranean Diet include: artichokes, arugula, beets, broccoli, brussel sprouts, cabbage, carrots, celery, collard greens, cucumbers, eggplant, kale, leeks, lemons, lettuce, mushrooms, okra, onions, peas, peppers, potatoes, pumpkin, radishes, rutabaga, shallots, spinach, sweet potatoes, turnips, zucchini - Fruits common to the Mediterranean Diet include: apples, apricots, avocados, cherries, clementines, dates, figs, grapefruits, grapes, melons, nectarines, oranges, peaches, pears, pomegranates, strawberries, tangerines  Fats - Replace butter and margarine with healthy oils, such as olive oil, canola oil, and tahini  - Limit nuts to no more than a handful a day  - Nuts include walnuts, almonds, pecans, pistachios, pine nuts  - Limit or avoid candied, honey roasted or heavily salted nuts - Olives are central to the [employer] - can be eaten whole or used in a variety of dishes   Meats Protein -  Limiting red meat: no more than a few times a month - When eating red meat: choose lean cuts and keep the portion to the size of deck of cards - Eggs: approx. 0 to 4 times a week  - Fish and lean poultry: at least 2 a week  - Healthy protein sources include, chicken, [place], lean beef, lamb - Increase intake of seafood such as tuna, salmon, trout, mackerel, shrimp, scallops - Avoid or limit high fat processed meats such as sausage and bacon  Dairy - Include moderate amounts of low fat dairy products  - Focus on healthy dairy such as fat free  yogurt, skim milk, low or reduced fat cheese - Limit dairy products higher in fat such as whole or 2% milk, cheese, ice cream  Alcohol - Moderate amounts of red wine is ok  - No more than 5 oz daily for women (all ages) and men older than age 66  - No more than 10 oz of wine daily for men younger than 37  Other - Limit sweets and other desserts  - Use herbs and spices instead of salt to flavor foods  - Herbs and spices common to the traditional Mediterranean Diet include: basil, bay leaves, chives, cloves, cumin, fennel, garlic, lavender, marjoram, mint, oregano, parsley, pepper, rosemary, sage, savory, sumac, tarragon, thyme   It's not just a diet, it's a lifestyle:  . The Mediterranean diet includes lifestyle factors typical of those in the region  . Foods, drinks and meals are best eaten with others and savored . Daily physical activity is important for overall good health . This could be strenuous exercise like running and aerobics . This could also be more leisurely activities such as walking, housework, yard-work, or taking the stairs . Moderation is the key; a balanced and healthy diet accommodates most foods and drinks . Consider portion sizes and frequency of consumption of certain foods   Meal Ideas & Options:  . Breakfast:  o Whole wheat toast or whole wheat English muffins with peanut butter & hard boiled egg o Steel cut oats topped with  apples & cinnamon and skim milk  o Fresh fruit: banana, strawberries, melon, berries, peaches  o Smoothies: strawberries, bananas, greek yogurt, peanut butter o Low fat greek yogurt with blueberries and granola  o Egg white omelet with spinach and mushrooms o Breakfast couscous: whole wheat couscous, apricots, skim milk, cranberries  . Sandwiches:  o Hummus and grilled vegetables (peppers, zucchini, squash) on whole wheat bread   o Grilled chicken on whole wheat pita with lettuce, tomatoes, cucumbers or tzatziki  o Tuna salad on whole wheat bread: tuna salad made with greek yogurt, olives, red peppers, capers, green onions o Garlic rosemary lamb pita: lamb sauted with garlic, rosemary, salt & pepper; add lettuce, cucumber, greek yogurt to pita - flavor with lemon juice and black pepper  . Seafood:  o Mediterranean grilled salmon, seasoned with garlic, basil, parsley, lemon juice and black pepper o Shrimp, lemon, and spinach whole-grain pasta salad made with low fat greek yogurt  o Seared scallops with lemon orzo  o Seared tuna steaks seasoned salt, pepper, coriander topped with tomato mixture of olives, tomatoes, olive oil, minced garlic, parsley, green onions and cappers  . Meats:  o Herbed greek chicken salad with kalamata olives, cucumber, feta  o Red bell peppers stuffed with spinach, bulgur, lean ground beef (or lentils) & topped with feta   o Kebabs: skewers of chicken, tomatoes, onions, zucchini, squash  o [place] burgers: made with red onions, mint, dill, lemon juice, feta cheese topped with roasted red peppers . Vegetarian o Cucumber salad: cucumbers, artichoke hearts, celery, red onion, feta cheese, tossed in olive oil & lemon juice  o Hummus and whole grain pita points with a greek salad (lettuce, tomato, feta, olives, cucumbers, red onion) o Lentil soup with celery, carrots made with vegetable broth, garlic, salt and pepper  o Tabouli salad: parsley, bulgur, mint, scallions,  cucumbers, tomato, radishes, lemon juice, olive oil, salt and pepper.          Standley Brooking. Panosh M.D.

## 2015-01-08 ENCOUNTER — Telehealth: Payer: Self-pay

## 2015-01-08 NOTE — Telephone Encounter (Signed)
Patient called stating she has decided she wants to go with Mirena IUD.  She wants [place] to check benefits. I forwarded this to Abigail Butts to check on her insurance for her. Patient informed.

## 2015-01-29 ENCOUNTER — Ambulatory Visit (INDEPENDENT_AMBULATORY_CARE_PROVIDER_SITE_OTHER): Payer: BLUE CROSS/BLUE SHIELD | Admitting: Gynecology

## 2015-01-29 ENCOUNTER — Encounter: Payer: Self-pay | Admitting: Gynecology

## 2015-01-29 ENCOUNTER — Telehealth: Payer: Self-pay

## 2015-01-29 VITALS — BP 140/80

## 2015-01-29 DIAGNOSIS — Z975 Presence of (intrauterine) contraceptive device: Secondary | ICD-10-CM | POA: Insufficient documentation

## 2015-01-29 DIAGNOSIS — Z30430 Encounter for insertion of intrauterine contraceptive device: Secondary | ICD-10-CM | POA: Diagnosis not present

## 2015-01-29 MED ORDER — LEVONORGESTREL 20 MCG/24HR IU IUD: 1.0000 | INTRAUTERINE_SYSTEM | Freq: Once | INTRAUTERINE | Status: DC

## 2015-01-29 MED ORDER — LEVONORGESTREL 20 MCG/24HR IU IUD
INTRAUTERINE_SYSTEM | Freq: Once | INTRAUTERINE | Status: AC
  Administered 2015-01-29: 17:00:00 via INTRAUTERINE

## 2015-01-29 NOTE — Telephone Encounter (Signed)
Yes this would be fine

## 2015-01-29 NOTE — Patient Instructions (Signed)

## 2015-01-29 NOTE — Addendum Note (Signed)
Addended by: Burnett Kanaris on: 01/29/2015 05:18 PM   Modules accepted: Orders

## 2015-01-29 NOTE — Telephone Encounter (Signed)
Patient informed. Patient asked if he will numb her cervix as part of procedure. I checked with Onalee Hua. And informed patient that the providers do not typically do that but will have it available in case it is needed.

## 2015-01-29 NOTE — Progress Notes (Signed)
   Patient presented to the office today for placement of Mirena IUD. Patient was seen the office in August 2015 for her annual exam had been complaining of very heavy and painful menses. Several years ago she had a tubal ligation. We discussed options such as the Mirena IUD or endometrial ablation she would like to try the Mirena IUD first and this is the reason for today's visit. Patient previously been provided with literature information.                                                                    IUD procedure note       Patient presented to the office today for placement of Mirena IUD. The patient had previously been provided with literature information on this method of contraception. The risks benefits and pros and cons were discussed and all her questions were answered. She is fully aware that this form of contraception is 99% effective and is good for 5 years.  Pelvic exam: Bartholin urethra Skene glands: Within normal limits Vagina: No lesions or discharge, menstrual blood Cervix: No lesions or discharge Uterus: Anteverted position Adnexa: No masses or tenderness Rectal exam: Not done  The cervix was cleansed with Betadine solution. A single-tooth tenaculum was placed on the anterior cervical lip. The uterus sounded to 7-1/2 centimeter. The IUD was shown to the patient and inserted in a sterile fashion. The IUD string was trimmed. The single-tooth tenaculum was removed. Patient was instructed to return back to the office in one month for follow up.       Mirena IUD inserted 01/29/2015 good for 5 years. Lot number [id number]

## 2015-01-29 NOTE — Telephone Encounter (Signed)
Patient had been waiting on [place] to get back with her with Mirena IUD benefits. I called for her today and spoke with Tonya call [phone] and was told it is covered at 100%.  Diagnosis is irrelevant to benefit.  Patient is on Day 4 of menses.  One fallopian tube removed and the other one has tubal ligation. Ok to schedule her to come by this afternoon to have it inserted. She would love to get it before she has to have another period.

## 2015-02-01 ENCOUNTER — Encounter: Payer: Self-pay | Admitting: Gynecology

## 2015-02-04 ENCOUNTER — Telehealth: Payer: Self-pay | Admitting: *Deleted

## 2015-02-04 ENCOUNTER — Other Ambulatory Visit: Payer: Self-pay | Admitting: Gynecology

## 2015-02-04 MED ORDER — SPIRONOLACTONE 25 MG PO TABS: 25.0000 mg | ORAL_TABLET | Freq: Every day | ORAL | Status: DC

## 2015-02-04 NOTE — Telephone Encounter (Signed)
Pt called requesting refill on spironolactone 25 mg ,last filled June 2014 with refills. States uses for fluid retention during cycle.

## 2015-02-04 NOTE — Telephone Encounter (Signed)
Rx last filled in 2013

## 2015-02-04 NOTE — Telephone Encounter (Signed)
Rx sent 

## 2015-02-04 NOTE — Telephone Encounter (Signed)
Refill granted 

## 2015-03-02 ENCOUNTER — Other Ambulatory Visit: Payer: Self-pay | Admitting: Gynecology

## 2015-03-02 NOTE — Telephone Encounter (Signed)
Only 30 day supple filled in April 2016 pt States uses for fluid retention during cycle. Please advise

## 2015-03-05 ENCOUNTER — Other Ambulatory Visit: Payer: Self-pay | Admitting: Gynecology

## 2015-03-05 ENCOUNTER — Ambulatory Visit (INDEPENDENT_AMBULATORY_CARE_PROVIDER_SITE_OTHER): Payer: BLUE CROSS/BLUE SHIELD

## 2015-03-05 ENCOUNTER — Encounter: Payer: Self-pay | Admitting: Gynecology

## 2015-03-05 ENCOUNTER — Ambulatory Visit (INDEPENDENT_AMBULATORY_CARE_PROVIDER_SITE_OTHER): Payer: BLUE CROSS/BLUE SHIELD | Admitting: Gynecology

## 2015-03-05 VITALS — BP 128/76

## 2015-03-05 DIAGNOSIS — N736 Female pelvic peritoneal adhesions (postinfective): Secondary | ICD-10-CM

## 2015-03-05 DIAGNOSIS — Z8041 Family history of malignant neoplasm of ovary: Secondary | ICD-10-CM

## 2015-03-05 DIAGNOSIS — Z30431 Encounter for routine checking of intrauterine contraceptive device: Secondary | ICD-10-CM | POA: Diagnosis not present

## 2015-03-05 DIAGNOSIS — D500 Iron deficiency anemia secondary to blood loss (chronic): Secondary | ICD-10-CM | POA: Diagnosis not present

## 2015-03-05 DIAGNOSIS — N920 Excessive and frequent menstruation with regular cycle: Secondary | ICD-10-CM

## 2015-03-05 LAB — CBC WITH DIFFERENTIAL/PLATELET
BASOS ABS: 0.1 10*3/uL (ref 0.0–0.1)
Basophils Relative: 1 % (ref 0–1)
EOS PCT: 2 % (ref 0–5)
Eosinophils Absolute: 0.2 10*3/uL (ref 0.0–0.7)
HCT: 41.7 % (ref 36.0–46.0)
Hemoglobin: 13.9 g/dL (ref 12.0–15.0)
LYMPHS PCT: 19 % (ref 12–46)
Lymphs Abs: 1.5 10*3/uL (ref 0.7–4.0)
MCH: 29.7 pg (ref 26.0–34.0)
MCHC: 33.3 g/dL (ref 30.0–36.0)
MCV: 89.1 fL (ref 78.0–100.0)
MONO ABS: 0.6 10*3/uL (ref 0.1–1.0)
MPV: 9.4 fL (ref 8.6–12.4)
Monocytes Relative: 7 % (ref 3–12)
Neutro Abs: 5.7 10*3/uL (ref 1.7–7.7)
Neutrophils Relative %: 71 % (ref 43–77)
PLATELETS: 275 10*3/uL (ref 150–400)
RBC: 4.68 MIL/uL (ref 3.87–5.11)
RDW: 12.9 % (ref 11.5–15.5)
WBC: 8 10*3/uL (ref 4.0–10.5)

## 2015-03-05 MED ORDER — ESTRADIOL 0.5 MG PO TABS: ORAL_TABLET | ORAL | Status: DC

## 2015-03-05 MED ORDER — IBUPROFEN 800 MG PO TABS: 800.0000 mg | ORAL_TABLET | Freq: Three times a day (TID) | ORAL | Status: DC | PRN

## 2015-03-05 NOTE — Progress Notes (Signed)
   Patient presented to the office today for 1 month follow-up after having placed the Mirena IUD. Patient had been complaining prior to the IUD of heavy and painful menses. Patient with past history of tubal ligation. She had been given the option of Mirena IUD versus endometrial ablation and she chose to have the Mirena IUD placed. She stated that her menstrual cycle start April 20 that was bleeding until approximately 2 days ago.  Exam: Blood pressure 120/76 Gen. appearance: Well-developed well-nourished female complaining of persistent bleeding after the Mirena IUD was placed into approximate 2-3 days ago. Pelvic: Bartholin urethra Skene was within normal limits Vagina all blood present in the vault Cervix: No active bleeding no IUD string seen Uterus: Upper limits of normal anteverted nontender Adnexa: No palpable masses or tenderness Rectal exam: Not done  Ultrasound today: Uterus measured 9.5 x 6.0 x 5.0 cm with endometrial stripe of 6.9 mm. IUD was seen in the endometrial cavity. No abnormalities were noted left ovary normal with a 19 x 18 mm follicle right adnexa normal no free fluid in the cul-de-sac  Assessment/plan: Breakthrough bleeding with Mirena IUD. IUD was placed one month ago. Patient with past history of dysmenorrhea and menorrhagia. To build up her endometrium before the start of her next cycle she's going to be prescribed Estrace 0.5 mg take 1 by mouth daily for 21 days. CBC will be checked today. Prescription for Motrin 800 mg to take 1 by mouth 3 times a day when necessary dysmenorrhea was provided. Patient scheduled to return back to the office later in the year for annual exam or when necessary.

## 2015-04-26 ENCOUNTER — Other Ambulatory Visit: Payer: Self-pay

## 2015-05-25 ENCOUNTER — Other Ambulatory Visit (INDEPENDENT_AMBULATORY_CARE_PROVIDER_SITE_OTHER): Payer: BLUE CROSS/BLUE SHIELD

## 2015-05-25 DIAGNOSIS — Z0000 Encounter for general adult medical examination without abnormal findings: Secondary | ICD-10-CM

## 2015-05-25 DIAGNOSIS — E119 Type 2 diabetes mellitus without complications: Secondary | ICD-10-CM | POA: Diagnosis not present

## 2015-05-25 LAB — CBC WITH DIFFERENTIAL/PLATELET
BASOS ABS: 0 10*3/uL (ref 0.0–0.1)
BASOS PCT: 0.3 % (ref 0.0–3.0)
Eosinophils Absolute: 0.2 10*3/uL (ref 0.0–0.7)
Eosinophils Relative: 1.5 % (ref 0.0–5.0)
HEMATOCRIT: 41.3 % (ref 36.0–46.0)
Hemoglobin: 14.1 g/dL (ref 12.0–15.0)
Lymphocytes Relative: 21.1 % (ref 12.0–46.0)
Lymphs Abs: 2.2 10*3/uL (ref 0.7–4.0)
MCHC: 34 g/dL (ref 30.0–36.0)
MCV: 88.2 fl (ref 78.0–100.0)
Monocytes Absolute: 0.7 10*3/uL (ref 0.1–1.0)
Monocytes Relative: 6.2 % (ref 3.0–12.0)
NEUTROS PCT: 70.9 % (ref 43.0–77.0)
Neutro Abs: 7.5 10*3/uL (ref 1.4–7.7)
Platelets: 244 10*3/uL (ref 150.0–400.0)
RBC: 4.68 Mil/uL (ref 3.87–5.11)
RDW: 13.1 % (ref 11.5–15.5)
WBC: 10.5 10*3/uL (ref 4.0–10.5)

## 2015-05-25 LAB — LIPID PANEL
CHOL/HDL RATIO: 4
Cholesterol: 144 mg/dL (ref 0–200)
HDL: 40.2 mg/dL (ref >39.00)
LDL Cholesterol: 83 mg/dL (ref 0–99)
NonHDL: 103.8
Triglycerides: 105 mg/dL (ref 0.0–149.0)
VLDL: 21 mg/dL (ref 0.0–40.0)

## 2015-05-25 LAB — HEPATIC FUNCTION PANEL
ALBUMIN: 3.9 g/dL (ref 3.5–5.2)
ALT: 12 U/L (ref 0–35)
AST: 15 U/L (ref 0–37)
Alkaline Phosphatase: 108 U/L (ref 39–117)
BILIRUBIN TOTAL: 0.6 mg/dL (ref 0.2–1.2)
Bilirubin, Direct: 0.1 mg/dL (ref 0.0–0.3)
Total Protein: 6.7 g/dL (ref 6.0–8.3)

## 2015-05-25 LAB — BASIC METABOLIC PANEL
BUN: 14 mg/dL (ref 6–23)
CO2: 25 meq/L (ref 19–32)
Calcium: 9.2 mg/dL (ref 8.4–10.5)
Chloride: 105 mEq/L (ref 96–112)
Creatinine, Ser: 0.75 mg/dL (ref 0.40–1.20)
GFR: 88.28 mL/min (ref >60.00)
GLUCOSE: 90 mg/dL (ref 70–99)
Potassium: 3.7 mEq/L (ref 3.5–5.1)
Sodium: 137 mEq/L (ref 135–145)

## 2015-05-25 LAB — HEMOGLOBIN A1C: Hgb A1c MFr Bld: 5.8 % (ref 4.6–6.5)

## 2015-05-25 LAB — TSH: TSH: 3.59 u[IU]/mL (ref 0.35–4.50)

## 2015-06-01 ENCOUNTER — Ambulatory Visit (INDEPENDENT_AMBULATORY_CARE_PROVIDER_SITE_OTHER): Payer: BLUE CROSS/BLUE SHIELD | Admitting: Internal Medicine

## 2015-06-01 ENCOUNTER — Encounter: Payer: Self-pay | Admitting: Internal Medicine

## 2015-06-01 VITALS — BP 110/80 | Temp 98.2°F | Ht 65.0 in | Wt 217.3 lb

## 2015-06-01 DIAGNOSIS — Z0000 Encounter for general adult medical examination without abnormal findings: Secondary | ICD-10-CM

## 2015-06-01 DIAGNOSIS — R739 Hyperglycemia, unspecified: Secondary | ICD-10-CM

## 2015-06-01 NOTE — Progress Notes (Signed)
Pre visit review using our clinic review tool, if applicable. No additional management support is needed unless otherwise documented below in the visit note.  Chief Complaint  Patient presents with  . Annual Exam    HPI: Patient  Brandy Maynard  44 y.o. comes in today for [place] visit  Has been intensifying lsi eating less processed foods and exercise . Left knee creptius  Arthritis  but does have a PTrainer.  Now has iud and  Less bleeding periods  Has weaned omeprazole to once a week   Health Maintenance  Topic Date Due  . PNEUMOCOCCAL POLYSACCHARIDE VACCINE (1) 01/13/1971  . FOOT EXAM  01/13/1979  . OPHTHALMOLOGY EXAM  01/13/1979  . URINE MICROALBUMIN  01/13/1979  . MAMMOGRAM  05/06/2015  . INFLUENZA VACCINE  05/31/2015  . HIV Screening  05/31/2016 (Originally 01/13/1984)  . PAP SMEAR  06/03/2015  . HEMOGLOBIN A1C  11/25/2015  . TETANUS/TDAP  04/29/2022   Health Maintenance Review LIFESTYLE:  Exercise:  yesw has PT Tobacco/ETS:no Alcohol: 1 per week at most  Sugar beverages:no Sleep:6-8  Drug use: no  ROS: had fall dog related in spring hematoma left shin and right elbow  GEN/ HEENT: No fever, significant weight changes sweats headaches vision problems hearing changes, CV/ PULM; No chest pain shortness of breath cough, syncope,edema  change in exercise tolerance. GI /GU: No adominal pain, vomiting, change in bowel habits. No blood in the stool. No significant GU symptoms. SKIN/HEME: ,no acute skin rashes suspicious lesions or bleeding. No lymphadenopathy, nodules, masses.  NEURO/ PSYCH:  No neurologic signs such as weakness numbness. No depression anxiety. IMM/ Allergy: No unusual infections.  Allergy .   REST of 12 system review negative except as per HPI   Past Medical History  Diagnosis Date  . Allergy   . Depression   . GERD (gastroesophageal reflux disease)   . Migraines   . Heavy menses     on ocps  . Anemia   . IBS (irritable bowel  syndrome)     by hx and had a neg colonscopy  . ANEMIA 12/15/2009    Qualifier: Diagnosis of  By: Regis Bill MD, Standley Brooking     Past Surgical History  Procedure Laterality Date  . Cholecystectomy    . Tubal ligation  09/2005    scar tissue removal ovarian cyst  . Ligament repair  2002    Rt ankle  . Plica, arthritis and cartilage repair  01/11/2011    on Rt knee  . Knee arthroscopy  2012    right knee,,, left knee at 15  . Colonoscopy  08/03/2005  . Pelvic laparoscopy  10/11/2005    LAPAROTOMY, LYSIS OF PELVIC ADHESIONS, RSO AND CAUTERIZATION AND TRANSECTION OF LEFT FALLOPIAN TUBE  . Foot surgery      Family History  Problem Relation Age of Onset  . Arthritis Mother   . Hypertension Mother   . Cancer Mother     MELANOMA  . Hyperlipidemia Mother   . Hypertension Maternal Grandmother   . Dementia Maternal Grandmother   . Hypertension Maternal Grandfather   . Cancer Maternal Grandfather     MELANOMA  . Heart disease Paternal Grandmother   . Diabetes Paternal Grandfather   . Heart disease Paternal Grandfather   . Heart disease Father   . Cancer - Ovarian Other     Mat Great Grandmother    History   Social History  . Marital Status: Married    Spouse Name:  N/A  . Number of Children: N/A  . Years of Education: N/A   Social History Main Topics  . Smoking status: Never Smoker   . Smokeless tobacco: Never Used  . Alcohol Use: Yes  . Drug Use: No  . Sexual Activity: Yes   Other Topics Concern  . None   Social History Narrative   Occupation: unemployed   Married   [place] of 3-4    Pet dogs 2   [id number]             Outpatient Prescriptions Prior to Visit  Medication Sig Dispense Refill  . cetirizine (ZYRTEC) 10 MG tablet Take 10 mg by mouth daily.    Marland Kitchen ibuprofen (ADVIL,MOTRIN) 800 MG tablet Take 1 tablet (800 mg total) by mouth every 8 (eight) hours as needed. for pain 30 tablet 5  . levonorgestrel (MIRENA) 20 MCG/24HR IUD 1 Intra Uterine Device (1 each total) by  Intrauterine route once. 1 each 0  . omeprazole (PRILOSEC OTC) 20 MG tablet Take 20 mg by mouth daily.     Marland Kitchen spironolactone (ALDACTONE) 25 MG tablet TAKE 1 TABLET (25 MG TOTAL) BY MOUTH DAILY. 30 tablet 10  . estradiol (ESTRACE) 0.5 MG tablet Take 1 tablet daily for 21 days only 21 tablet 0   No facility-administered medications prior to visit.     EXAM:  BP 110/80 mmHg  Temp(Src) 98.2 F (36.8 C) (Oral)  Ht 5\' 5"  (1.651 m)  Wt 217 lb 4.8 oz (98.567 kg)  BMI 36.16 kg/m2  LMP 06/01/2015  Body mass index is 36.16 kg/(m^2).  Physical Exam: Vital signs reviewed ATF:TDDU is a well-developed well-nourished alert cooperative    who appearsr stated age in no acute distress.  HEENT: normocephalic atraumatic , Eyes: PERRL EOM's full, conjunctiva clear, Nares: paten,t no deformity discharge or tenderness., Ears: no deformity EAC's clear TMs with normal landmarks. Mouth: clear OP, no lesions, edema.  Moist mucous membranes. Dentition in adequate repair. NECK: supple without masses, thyromegaly or bruits. CHEST/PULM:  Clear to auscultation and percussion breath sounds equal no wheeze , rales or rhonchi. No chest wall deformities or tenderness. CV: PMI is nondisplaced, S1 S2 no gallops, murmurs, rubs. Peripheral pulses are full without delay.No JVD .  ABDOMEN: Bowel sounds normal nontender  No guard or rebound, no hepato splenomegal no CVA tenderness.  No hernia. Extremtities:  No clubbing cyanosis or edema, no acute joint swelling or redness no focal atrophy crepitus left knee  NEURO:  Oriented x3, cranial nerves 3-12 appear to be intact, no obvious focal weakness,gait within normal limits no abnormal reflexes or asymmetrical SKIN: No acute rashes normal turgor, color, no bruising or petechiae. PSYCH: Oriented, good eye contact, no obvious depression anxiety, cognition and judgment appear normal. LN: no cervical axillary inguinal adenopathy  Lab Results  Component Value Date   WBC 10.5  05/25/2015   HGB 14.1 05/25/2015   HCT 41.3 05/25/2015   PLT 244.0 05/25/2015   GLUCOSE 90 05/25/2015   CHOL 144 05/25/2015   TRIG 105.0 05/25/2015   HDL 40.20 05/25/2015   LDLCALC 83 05/25/2015   ALT 12 05/25/2015   AST 15 05/25/2015   NA 137 05/25/2015   K 3.7 05/25/2015   CL 105 05/25/2015   CREATININE 0.75 05/25/2015   BUN 14 05/25/2015   CO2 25 05/25/2015   TSH 3.59 05/25/2015   HGBA1C 5.8 05/25/2015   BP Readings from Last 3 Encounters:  06/01/15 110/80  03/05/15 128/76  01/29/15 140/80  Wt Readings from Last 3 Encounters:  06/01/15 217 lb 4.8 oz (98.567 kg)  12/04/14 219 lb 8 oz (99.565 kg)  06/04/14 216 lb (97.977 kg)      ASSESSMENT AND PLAN:  Discussed the following assessment and plan:  Visit for preventive health examination - counseled   Hyperglycemia - much improved  at risk  Counseled regarding healthy nutrition, exercise, sleep, injury prevention and healthy weight .  Patient Care Team: Burnis Medin, MD as PCP - General Vickey Huger, MD (Orthopedic Surgery) Terrance Mass, MD (Obstetrics and Gynecology) Jerrell Belfast, MD (Otolaryngology) Patient Instructions  Continue lifestyle intervention healthy eating and exercise .  Mediterranean diet.   Healthy lifestyle includes : At least 150 minutes of exercise weeks  , weight at healthy levels, which is usually   BMI 19-25. Avoid trans fats and processed foods;  Increase fresh fruits and veges to 5 servings per day. And avoid sweet beverages including tea and juice. Mediterranean diet with olive oil and nuts have been noted to be heart and brain healthy . Avoid tobacco products . Limit  alcohol to  7 per week for women and 14 servings for men.  Get adequate sleep . Wear seat belts . Don't text and drive .       Standley Brooking. Bertil Brickey M.D.

## 2015-06-01 NOTE — Patient Instructions (Signed)
Continue lifestyle intervention healthy eating and exercise .  Mediterranean diet.   Healthy lifestyle includes : At least 150 minutes of exercise weeks  , weight at healthy levels, which is usually   BMI 19-25. Avoid trans fats and processed foods;  Increase fresh fruits and veges to 5 servings per day. And avoid sweet beverages including tea and juice. Mediterranean diet with olive oil and nuts have been noted to be heart and brain healthy . Avoid tobacco products . Limit  alcohol to  7 per week for women and 14 servings for men.  Get adequate sleep . Wear seat belts . Don't text and drive .

## 2015-06-11 ENCOUNTER — Encounter: Payer: Self-pay | Admitting: Gynecology

## 2015-06-13 ENCOUNTER — Emergency Department (HOSPITAL_BASED_OUTPATIENT_CLINIC_OR_DEPARTMENT_OTHER)
Admission: EM | Admit: 2015-06-13 | Discharge: 2015-06-14 | Disposition: A | Discharge status: Home / Self Care | Payer: BLUE CROSS/BLUE SHIELD | Attending: Emergency Medicine | Admitting: Emergency Medicine

## 2015-06-13 ENCOUNTER — Encounter (HOSPITAL_BASED_OUTPATIENT_CLINIC_OR_DEPARTMENT_OTHER): Payer: Self-pay | Admitting: Emergency Medicine

## 2015-06-13 ENCOUNTER — Emergency Department (HOSPITAL_BASED_OUTPATIENT_CLINIC_OR_DEPARTMENT_OTHER): Payer: BLUE CROSS/BLUE SHIELD

## 2015-06-13 DIAGNOSIS — Z8679 Personal history of other diseases of the circulatory system: Secondary | ICD-10-CM | POA: Insufficient documentation

## 2015-06-13 DIAGNOSIS — R197 Diarrhea, unspecified: Secondary | ICD-10-CM | POA: Diagnosis not present

## 2015-06-13 DIAGNOSIS — Z79899 Other long term (current) drug therapy: Secondary | ICD-10-CM | POA: Insufficient documentation

## 2015-06-13 DIAGNOSIS — Z8659 Personal history of other mental and behavioral disorders: Secondary | ICD-10-CM | POA: Insufficient documentation

## 2015-06-13 DIAGNOSIS — Z880 Allergy status to penicillin: Secondary | ICD-10-CM | POA: Diagnosis not present

## 2015-06-13 DIAGNOSIS — Z862 Personal history of diseases of the blood and blood-forming organs and certain disorders involving the immune mechanism: Secondary | ICD-10-CM | POA: Insufficient documentation

## 2015-06-13 DIAGNOSIS — Z3202 Encounter for pregnancy test, result negative: Secondary | ICD-10-CM | POA: Diagnosis not present

## 2015-06-13 DIAGNOSIS — Z8742 Personal history of other diseases of the female genital tract: Secondary | ICD-10-CM | POA: Diagnosis not present

## 2015-06-13 DIAGNOSIS — R109 Unspecified abdominal pain: Secondary | ICD-10-CM

## 2015-06-13 DIAGNOSIS — K921 Melena: Secondary | ICD-10-CM | POA: Insufficient documentation

## 2015-06-13 DIAGNOSIS — K589 Irritable bowel syndrome without diarrhea: Secondary | ICD-10-CM | POA: Insufficient documentation

## 2015-06-13 DIAGNOSIS — K219 Gastro-esophageal reflux disease without esophagitis: Secondary | ICD-10-CM | POA: Insufficient documentation

## 2015-06-13 LAB — URINE MICROSCOPIC-ADD ON

## 2015-06-13 LAB — CBC WITH DIFFERENTIAL/PLATELET
Basophils Absolute: 0 10*3/uL (ref 0.0–0.1)
Basophils Relative: 0 % (ref 0–1)
EOS ABS: 0.1 10*3/uL (ref 0.0–0.7)
EOS PCT: 1 % (ref 0–5)
HCT: 41 % (ref 36.0–46.0)
Hemoglobin: 13.6 g/dL (ref 12.0–15.0)
LYMPHS PCT: 23 % (ref 12–46)
Lymphs Abs: 2.6 10*3/uL (ref 0.7–4.0)
MCH: 29.6 pg (ref 26.0–34.0)
MCHC: 33.2 g/dL (ref 30.0–36.0)
MCV: 89.1 fL (ref 78.0–100.0)
MONO ABS: 0.9 10*3/uL (ref 0.1–1.0)
Monocytes Relative: 7 % (ref 3–12)
Neutro Abs: 8.1 10*3/uL — ABNORMAL HIGH (ref 1.7–7.7)
Neutrophils Relative %: 69 % (ref 43–77)
Platelets: 248 10*3/uL (ref 150–400)
RBC: 4.6 MIL/uL (ref 3.87–5.11)
RDW: 12.5 % (ref 11.5–15.5)
WBC: 11.7 10*3/uL — ABNORMAL HIGH (ref 4.0–10.5)

## 2015-06-13 LAB — URINALYSIS, ROUTINE W REFLEX MICROSCOPIC
Bilirubin Urine: NEGATIVE
GLUCOSE, UA: NEGATIVE mg/dL
Ketones, ur: 15 mg/dL — AB
LEUKOCYTES UA: NEGATIVE
Nitrite: NEGATIVE
PH: 6 (ref 5.0–8.0)
PROTEIN: NEGATIVE mg/dL
Specific Gravity, Urine: 1.025 (ref 1.005–1.030)
UROBILINOGEN UA: 0.2 mg/dL (ref 0.0–1.0)

## 2015-06-13 LAB — COMPREHENSIVE METABOLIC PANEL
ALBUMIN: 4 g/dL (ref 3.5–5.0)
ALT: 13 U/L — AB (ref 14–54)
AST: 18 U/L (ref 15–41)
Alkaline Phosphatase: 109 U/L (ref 38–126)
Anion gap: 10 (ref 5–15)
BUN: 14 mg/dL (ref 6–20)
CALCIUM: 9.3 mg/dL (ref 8.9–10.3)
CO2: 23 mmol/L (ref 22–32)
Chloride: 105 mmol/L (ref 101–111)
Creatinine, Ser: 0.73 mg/dL (ref 0.44–1.00)
GFR calc Af Amer: >60 mL/min (ref >60)
GLUCOSE: 100 mg/dL — AB (ref 65–99)
Potassium: 3.5 mmol/L (ref 3.5–5.1)
SODIUM: 138 mmol/L (ref 135–145)
TOTAL PROTEIN: 6.9 g/dL (ref 6.5–8.1)
Total Bilirubin: 0.6 mg/dL (ref 0.3–1.2)

## 2015-06-13 LAB — PREGNANCY, URINE: Preg Test, Ur: NEGATIVE

## 2015-06-13 MED ORDER — OXYCODONE-ACETAMINOPHEN 5-325 MG PO TABS: 1.0000 | ORAL_TABLET | Freq: Three times a day (TID) | ORAL | Status: DC | PRN

## 2015-06-13 MED ORDER — OXYCODONE-ACETAMINOPHEN 5-325 MG PO TABS
1.0000 | ORAL_TABLET | Freq: Once | ORAL | Status: AC
  Administered 2015-06-13: 1 via ORAL
  Filled 2015-06-13: qty 1

## 2015-06-13 NOTE — Discharge Instructions (Signed)
Irritable Bowel Syndrome Irritable bowel syndrome (IBS) is caused by a disturbance of normal bowel function and is a common digestive disorder. You may also hear this condition called spastic colon, mucous colitis, and irritable colon. There is no cure for IBS. However, symptoms often gradually improve or disappear with a good diet, stress management, and medicine. This condition usually appears in late adolescence or early adulthood. Women develop it twice as often as men. CAUSES  After food has been digested and absorbed in the small intestine, waste material is moved into the large intestine, or colon. In the colon, water and salts are absorbed from the undigested products coming from the small intestine. The remaining residue, or fecal material, is held for elimination. Under normal circumstances, gentle, rhythmic contractions of the bowel walls push the fecal material along the colon toward the rectum. In IBS, however, these contractions are irregular and poorly coordinated. The fecal material is either retained too long, resulting in constipation, or expelled too soon, producing diarrhea. SIGNS AND SYMPTOMS  The most common symptom of IBS is abdominal pain. It is often in the lower left side of the abdomen, but it may occur anywhere in the abdomen. The pain comes from spasms of the bowel muscles happening too much and from the buildup of gas and fecal material in the colon. This pain:  Can range from sharp abdominal cramps to a dull, continuous ache.  Often worsens soon after eating.  Is often relieved by having a bowel movement or passing gas. Abdominal pain is usually accompanied by constipation, but it may also produce diarrhea. The diarrhea often occurs right after a meal or upon waking up in the morning. The stools are often soft, watery, and flecked with mucus. Other symptoms of IBS include:  Bloating.  Loss of appetite.  Heartburn.  Backache.  Dull pain in the arms or  shoulders.  Nausea.  Burping.  Vomiting.  Gas. IBS may also cause symptoms that are unrelated to the digestive system, such as:  Fatigue.  Headaches.  Anxiety.  Shortness of breath.  Trouble concentrating.  Dizziness. These symptoms tend to come and go. DIAGNOSIS  The symptoms of IBS may seem like symptoms of other, more serious digestive disorders. Your health care provider may want to perform tests to exclude these disorders.  TREATMENT Many medicines are available to help correct bowel function or relieve bowel spasms and abdominal pain. Among the medicines available are:  Laxatives for severe constipation and to help restore normal bowel habits.  Specific antidiarrheal medicines to treat severe or lasting diarrhea.  Antispasmodic agents to relieve intestinal cramps. Your health care provider may also decide to treat you with a mild tranquilizer or sedative during unusually stressful periods in your life. Your health care provider may also prescribe antidepressant medicine. The use of this medicine has been shown to reduce pain and other symptoms of IBS. Remember that if any medicine is prescribed for you, you should take it exactly as directed. Make sure your health care provider knows how well it worked for you. HOME CARE INSTRUCTIONS   Take all medicines as directed by your health care provider.  Avoid foods that are high in fat or oils, such as heavy cream, butter, frankfurters, sausage, and other fatty meats.  Avoid foods that make you go to the bathroom, such as fruit, fruit juice, and dairy products.  Cut out carbonated drinks, chewing gum, and "gassy" foods such as beans and cabbage. This may help relieve bloating and burping.    Eat foods with bran, and drink plenty of liquids with the bran foods. This helps relieve constipation.  Keep track of what foods seem to bring on your symptoms.  Avoid emotionally charged situations or circumstances that produce  anxiety.  Start or continue exercising.  Get plenty of rest and sleep. Document Released: 10/16/2005 Document Revised: 10/21/2013 Document Reviewed: 06/05/2008 Exit[employer] Patient Information 2015 [employer]. This information is not intended to replace advice given to you by your health care provider. Make sure you discuss any questions you have with your health care provider.  

## 2015-06-13 NOTE — ED Notes (Signed)
Patient reports that she has had frequent loose stools all week, today she reports that she is having frequent urination, bloating and right flank pain

## 2015-06-13 NOTE — ED Notes (Addendum)
C/o abd pain, R flank pain and nausea.  (denies fever or vomiting).  H/o IBS, "IBS sx all week, but also having R flank pain and urinary sx not c/w or usual to her IBS". Last at 1300. Last BM 1330 (diarrhea), mentions some bright red rectal bleeding (scant), but also mentions aggravated hemorrhoid this week. Concern for "endometrial/abd scar tissue issues d/t hx".

## 2015-06-13 NOTE — ED Notes (Signed)
Up to b/r, steady gait, alert, NAD, calm, interactive, pain and nausea remain.

## 2015-06-13 NOTE — ED Provider Notes (Signed)
CSN: [id number]     Arrival date & time 06/13/15  2031 History   First MD Initiated Contact with Patient 06/13/15 2134     Chief Complaint  Patient presents with  . Pelvic Pain  . Flank Pain   HPI  Brandy Maynard is a 44yo female presenting today for abdominal pain and flank pain. Reports frequent loose stools all week, bloating, and epigastric pain, all of which are consistent with her IBS. States she has been well controlled over the last year, but she recently went on vacation and ate things she doesn't normally eat, which she believes started this flare. Also reports nausea, very frequent urination, and nausea x1 day which is new for her. Pain is in LLQ and radiates around to right flank. Denies vomiting. Denies fever. States she moved her daughter in to college yesterday and had to move some light boxes up to third floor. States pain does not feel like muscle pain. Also mentions some bright red rectal bleeding, but states she had hemorroids this week.   Past Medical History  Diagnosis Date  . Allergy   . Depression   . GERD (gastroesophageal reflux disease)   . Migraines   . Heavy menses     on ocps  . Anemia   . IBS (irritable bowel syndrome)     by hx and had a neg colonscopy  . ANEMIA 12/15/2009    Qualifier: Diagnosis of  By: Regis Bill MD, Standley Brooking    Past Surgical History  Procedure Laterality Date  . Cholecystectomy    . Tubal ligation  09/2005    scar tissue removal ovarian cyst  . Ligament repair  2002    Rt ankle  . Plica, arthritis and cartilage repair  01/11/2011    on Rt knee  . Knee arthroscopy  2012    right knee,,, left knee at 15  . Colonoscopy  08/03/2005  . Pelvic laparoscopy  10/11/2005    LAPAROTOMY, LYSIS OF PELVIC ADHESIONS, RSO AND CAUTERIZATION AND TRANSECTION OF LEFT FALLOPIAN TUBE  . Foot surgery     Family History  Problem Relation Age of Onset  . Arthritis Mother   . Hypertension Mother   . Cancer Mother     MELANOMA  . Hyperlipidemia Mother    . Hypertension Maternal Grandmother   . Dementia Maternal Grandmother   . Hypertension Maternal Grandfather   . Cancer Maternal Grandfather     MELANOMA  . Heart disease Paternal Grandmother   . Diabetes Paternal Grandfather   . Heart disease Paternal Grandfather   . Heart disease Father   . Cancer - Ovarian Other     Mat Great Grandmother   Social History  Substance Use Topics  . Smoking status: Never Smoker   . Smokeless tobacco: Never Used  . Alcohol Use: Yes   OB History    Gravida Para Term Preterm AB TAB SAB Ectopic Multiple Living   3 2 2  1  1   2      Review of Systems  Constitutional: Negative for fever and chills.  Respiratory: Negative for shortness of breath.   Cardiovascular: Negative for chest pain.  Gastrointestinal: Positive for nausea, abdominal pain, diarrhea and blood in stool. Negative for vomiting and constipation.      Allergies  Junel fe 1-20; Penicillins; and Vicodin  Home Medications   Prior to Admission medications   Medication Sig Start Date End Date Taking? Authorizing Provider  cetirizine (ZYRTEC) 10 MG tablet Take 10 mg  by mouth daily.    Historical Provider, MD  ibuprofen (ADVIL,MOTRIN) 800 MG tablet Take 1 tablet (800 mg total) by mouth every 8 (eight) hours as needed. for pain 03/05/15   Terrance Mass, MD  levonorgestrel (MIRENA) 20 MCG/24HR IUD 1 Intra Uterine Device (1 each total) by Intrauterine route once. 01/29/15   Terrance Mass, MD  omeprazole (PRILOSEC OTC) 20 MG tablet Take 20 mg by mouth daily.     Historical Provider, MD  spironolactone (ALDACTONE) 25 MG tablet TAKE 1 TABLET (25 MG TOTAL) BY MOUTH DAILY. 03/02/15   Terrance Mass, MD   BP 112/75 mmHg  Pulse 64  Temp(Src) 98.5 F (36.9 C) (Oral)  Resp 16  Ht 5\' 5"  (1.651 m)  Wt 210 lb (95.255 kg)  BMI 34.95 kg/m2  SpO2 100%  LMP 06/01/2015 Physical Exam  Constitutional: She appears well-developed and well-nourished. No distress.  HENT:  Head: Normocephalic and  atraumatic.  Cardiovascular: Normal rate and regular rhythm.  Exam reveals no gallop and no friction rub.   No murmur heard. Pulmonary/Chest: Effort normal. No respiratory distress. She has no wheezes. She has no rales.  Abdominal: Soft. Bowel sounds are normal. She exhibits no distension. There is no rebound.  Tenderness over RLQ and around right flank. Also notes suprapubic tenderness.    ED Course  Procedures (including critical care time) Labs Review Labs Reviewed  URINALYSIS, ROUTINE W REFLEX MICROSCOPIC (NOT AT [facility]) - Abnormal; Notable for the following:    Hgb urine dipstick TRACE (*)    Ketones, ur 15 (*)    All other components within normal limits  URINE MICROSCOPIC-ADD ON - Abnormal; Notable for the following:    Squamous Epithelial / LPF FEW (*)    Bacteria, UA FEW (*)    All other components within normal limits  CBC WITH DIFFERENTIAL/PLATELET - Abnormal; Notable for the following:    WBC 11.7 (*)    Neutro Abs 8.1 (*)    All other components within normal limits  COMPREHENSIVE METABOLIC PANEL - Abnormal; Notable for the following:    Glucose, Bld 100 (*)    ALT 13 (*)    All other components within normal limits  PREGNANCY, URINE    Imaging Review Ct Renal Stone Study  06/13/2015   CLINICAL DATA:  Frequent urination and right flank pain  EXAM: CT ABDOMEN AND PELVIS WITHOUT CONTRAST  TECHNIQUE: Multidetector CT imaging of the abdomen and pelvis was performed following the standard protocol without IV contrast.  COMPARISON:  None.  FINDINGS: BODY WALL: No contributory findings.  LOWER CHEST: No contributory findings.  ABDOMEN/PELVIS:  Liver: No focal abnormality.  Biliary: Cholecystectomy.  Pancreas: Unremarkable.  Spleen: Unremarkable.  Adrenals: Unremarkable.  Kidneys and ureters: No hydronephrosis or stone.  Bladder: Unremarkable.  Reproductive: No pathologic findings. IUD which is in good position.  Bowel: No obstruction. No appendicitis. Uncomplicated distal  duodenal diverticulum.  Retroperitoneum: No mass or adenopathy.  Peritoneum: No ascites or pneumoperitoneum.  Vascular: No acute abnormality.  OSSEOUS: No acute abnormalities.  IMPRESSION: No explanation for abdominal pain.   Electronically Signed   By: Monte Fantasia M.D.   On: 06/13/2015 23:43   I, Junie Panning, personally reviewed and evaluated these images and lab results as part of my medical decision-making.   EKG Interpretation None      MDM   Final diagnoses:  Right flank pain  Pregnancy test negative. Urinalysis with trace hemoglobin. CBC with mild leukocytosis of 11.7. CMP normal. CT  renal stone study with no acute abnormalities. Percocet given for pain. Suspect symptoms are due to IBS vs. Musculoskeletal. Stable for discharge with follow up with PCP. Recommend taking Protonix daily for at least the next week or two. Will give prescription for percocet.    [place], [place] 06/13/15 Ginger Blue, MD 06/13/15 [phone]

## 2015-09-10 ENCOUNTER — Encounter: Payer: BLUE CROSS/BLUE SHIELD | Admitting: Gynecology

## 2015-10-04 ENCOUNTER — Ambulatory Visit (INDEPENDENT_AMBULATORY_CARE_PROVIDER_SITE_OTHER): Payer: BLUE CROSS/BLUE SHIELD | Admitting: Gynecology

## 2015-10-04 ENCOUNTER — Encounter: Payer: Self-pay | Admitting: Gynecology

## 2015-10-04 VITALS — BP 130/82 | Ht 65.0 in | Wt 192.0 lb

## 2015-10-04 DIAGNOSIS — Z23 Encounter for immunization: Secondary | ICD-10-CM | POA: Diagnosis not present

## 2015-10-04 DIAGNOSIS — Z01419 Encounter for gynecological examination (general) (routine) without abnormal findings: Secondary | ICD-10-CM

## 2015-10-04 DIAGNOSIS — R102 Pelvic and perineal pain: Secondary | ICD-10-CM

## 2015-10-04 NOTE — Progress Notes (Signed)
Brandy Maynard 06-Sep-1971 [id number]   History:    44 y.o.  for annual gyn exam who states that for the past several months she's been complaining on and off of right lower quadrant discomfort very little during intercourse. She has severe pain back in August and went to the emergency room and had a normal abdominal CT scan. She does suffer from IBS. Her last colonoscopy was 10 years ago. She states this pain occurs 3-4 times a week sometimes whether without physical activity. Patient had an Mirena IUD placed early this year to help regulate her cycles and to take care of her chronic anemia which she states has helped tremendously. Patient with no previous history of any abnormal Pap smears. Patient many years ago had right salpingo-oophorectomy for benign ovarian cyst and lysis of pelvic adhesions laparoscopically.  Past medical history,surgical history, family history and social history were all reviewed and documented in the EPIC chart.  Gynecologic History No LMP recorded. Patient is not currently having periods (Reason: IUD). Contraception: IUD and tubal ligation Last Pap: 2015. Results were: normal Last mammogram: 2016. Results were: normal  Obstetric History OB History  Gravida Para Term Preterm AB SAB TAB Ectopic Multiple Living  3 2 2  1 1    2     # Outcome Date GA Lbr Len/2nd Weight Sex Delivery Anes PTL Lv  3 SAB           2 Term     F Vag-Spont  N Y  1 Term     F Vag-Spont  N Y       ROS: A ROS was performed and pertinent positives and negatives are included in the history.  GENERAL: No fevers or chills. HEENT: No change in vision, no earache, sore throat or sinus congestion. NECK: No pain or stiffness. CARDIOVASCULAR: No chest pain or pressure. No palpitations. PULMONARY: No shortness of breath, cough or wheeze. GASTROINTESTINAL: No abdominal pain, nausea, vomiting or diarrhea, melena or bright red blood per rectum. GENITOURINARY: No urinary frequency, urgency, hesitancy  or dysuria. MUSCULOSKELETAL: No joint or muscle pain, no back pain, no recent trauma. DERMATOLOGIC: No rash, no itching, no lesions. ENDOCRINE: No polyuria, polydipsia, no heat or cold intolerance. No recent change in weight. HEMATOLOGICAL: No anemia or easy bruising or bleeding. NEUROLOGIC: No headache, seizures, numbness, tingling or weakness. PSYCHIATRIC: No depression, no loss of interest in normal activity or change in sleep pattern.     Exam: chaperone present  BP 130/82 mmHg  Ht 5\' 5"  (1.651 m)  Wt 192 lb (87.091 kg)  BMI 31.95 kg/m2  Body mass index is 31.95 kg/(m^2).  General appearance : Well developed well nourished female. No acute distress HEENT: Eyes: no retinal hemorrhage or exudates,  Neck supple, trachea midline, no carotid bruits, no thyroidmegaly Lungs: Clear to auscultation, no rhonchi or wheezes, or rib retractions  Heart: Regular rate and rhythm, no murmurs or gallops Breast:Examined in sitting and supine position were symmetrical in appearance, no palpable masses or tenderness,  no skin retraction, no nipple inversion, no nipple discharge, no skin discoloration, no axillary or supraclavicular lymphadenopathy Abdomen: no palpable masses or tenderness, no rebound or guarding Extremities: no edema or skin discoloration or tenderness  Pelvic:  Bartholin, Urethra, Skene Glands: Within normal limits             Vagina: No gross lesions or discharge  Cervix: No gross lesions or discharge, IUD string not seen  Uterus  anteverted, normal size,  shape and consistency, non-tender and mobile  Adnexa  Without masses or tenderness  Anus and perineum  normal   Rectovaginal  normal sphincter tone without palpated masses or tenderness             Hemoccult not indicated     Assessment/Plan:  43 y.o. female for annual exam patient with chronic lower abdominal pains. Past history of IBS. Patient states her bowel movements are sometimes very loose and sometimes constipated. She had  a normal colonoscopy in 2006 I'm going to recommend she follow up with her gastroenterologist for follow-up. We will also do a pelvic ultrasound here in the office in the next few weeks. If her ultrasound is negative and her colonoscopy is normal we discussed the possibility of laparoscopy for possible lysis of adhesions if her pains are affecting her quality of life. No Pap smear is indicated today. She was reminded do her monthly breast exams. Patient received the flu vaccine today. She was examined in the sitting and supine position there was no evidence of any Pfannenstiel incisional hernia or any inguinal hernia although she was slightly tender on the right groin region. Patient does have a pendulous abdomen.   Terrance Mass MD, 12:34 PM 10/04/2015

## 2015-10-05 ENCOUNTER — Telehealth: Payer: Self-pay | Admitting: *Deleted

## 2015-10-05 NOTE — Telephone Encounter (Signed)
Appointment on 10/19/15 @ 2:15pm with Dr.Schooler, pt informed

## 2015-10-05 NOTE — Telephone Encounter (Signed)
-----   Message from Terrance Mass, MD sent at 10/04/2015 12:35 PM EST ----- Anderson Malta, please make an appointment for this patient with [facility]. Patient is a former patient who had a colonoscopy 10 years ago and has symptoms of worsening IBS and needs to be seen. Hopefully before the end of the year?

## 2015-10-21 ENCOUNTER — Ambulatory Visit (INDEPENDENT_AMBULATORY_CARE_PROVIDER_SITE_OTHER): Payer: BLUE CROSS/BLUE SHIELD | Admitting: Gynecology

## 2015-10-21 ENCOUNTER — Other Ambulatory Visit: Payer: Self-pay | Admitting: Gynecology

## 2015-10-21 ENCOUNTER — Ambulatory Visit (INDEPENDENT_AMBULATORY_CARE_PROVIDER_SITE_OTHER): Payer: BLUE CROSS/BLUE SHIELD

## 2015-10-21 DIAGNOSIS — N83202 Unspecified ovarian cyst, left side: Secondary | ICD-10-CM

## 2015-10-21 DIAGNOSIS — R102 Pelvic and perineal pain: Secondary | ICD-10-CM | POA: Diagnosis not present

## 2015-10-21 NOTE — Patient Instructions (Signed)
Diagnostic Laparoscopy A diagnostic laparoscopy is a procedure to diagnose diseases in the abdomen. During the procedure, a thin, lighted, pencil-sized instrument called a laparoscope is inserted into the abdomen through an incision. The laparoscope allows your health care provider to look at the organs inside your body. LET [facility] CARE PROVIDER KNOW ABOUT:  Any allergies you have.  All medicines you are taking, including vitamins, herbs, eye drops, creams, and over-the-counter medicines.  Previous problems you or members of your family have had with the use of anesthetics.  Any blood disorders you have.  Previous surgeries you have had.  Medical conditions you have. RISKS AND COMPLICATIONS  Generally, this is a safe procedure. However, problems can occur, which may include:  Infection.  Bleeding.  Damage to other organs.  Allergic reaction to the anesthetics used during the procedure. BEFORE THE PROCEDURE  Do not eat or drink anything after midnight on the night before the procedure or as directed by your health care provider.  Ask your health care provider about:  Changing or stopping your regular medicines.  Taking medicines such as aspirin and ibuprofen. These medicines can thin your blood. Do not take these medicines before your procedure if your health care provider instructs you not to.  Plan to have someone take you home after the procedure. PROCEDURE  You may be given a medicine to help you relax (sedative).  You will be given a medicine to make you sleep (general anesthetic).  Your abdomen will be inflated with a gas. This will make your organs easier to see.  Small incisions will be made in your abdomen.  A laparoscope and other small instruments will be inserted into the abdomen through the incisions.  A tissue sample may be removed from an organ in the abdomen for examination.  The instruments will be removed from the abdomen.  The gas will be  released.  The incisions will be closed with stitches (sutures). AFTER THE PROCEDURE  Your blood pressure, heart rate, breathing rate, and blood oxygen level will be monitored often until the medicines you were given have worn off.   This information is not intended to replace advice given to you by your health care provider. Make sure you discuss any questions you have with your health care provider.   Document Released: 01/22/2001 Document Revised: 07/07/2015 Document Reviewed: 05/29/2014 Elsevier Interactive Patient Education 2016 [place]. Ovarian Cyst An ovarian cyst is a fluid-filled sac that forms on an ovary. The ovaries are small organs that produce eggs in women. Various types of cysts can form on the ovaries. Most are not cancerous. Many do not cause problems, and they often go away on their own. Some may cause symptoms and require treatment. Common types of ovarian cysts include:  Functional cysts--These cysts may occur every month during the menstrual cycle. This is normal. The cysts usually go away with the next menstrual cycle if the woman does not get pregnant. Usually, there are no symptoms with a functional cyst.  Endometrioma cysts--These cysts form from the tissue that lines the uterus. They are also called "chocolate cysts" because they become filled with blood that turns brown. This type of cyst can cause pain in the lower abdomen during intercourse and with your menstrual period.  Cystadenoma cysts--This type develops from the cells on the outside of the ovary. These cysts can get very big and cause lower abdomen pain and pain with intercourse. This type of cyst can twist on itself, cut off its  blood supply, and cause severe pain. It can also easily rupture and cause a lot of pain.  Dermoid cysts--This type of cyst is sometimes found in both ovaries. These cysts may contain different kinds of body tissue, such as skin, teeth, hair, or cartilage. They usually do not  cause symptoms unless they get very big.  Theca lutein cysts--These cysts occur when too much of a certain hormone (human chorionic gonadotropin) is produced and overstimulates the ovaries to produce an egg. This is most common after procedures used to assist with the conception of a baby (in vitro fertilization). CAUSES   Fertility drugs can cause a condition in which multiple large cysts are formed on the ovaries. This is called ovarian hyperstimulation syndrome.  A condition called polycystic ovary syndrome can cause hormonal imbalances that can lead to nonfunctional ovarian cysts. SIGNS AND SYMPTOMS  Many ovarian cysts do not cause symptoms. If symptoms are present, they may include:  Pelvic pain or pressure.  Pain in the lower abdomen.  Pain during sexual intercourse.  Increasing girth (swelling) of the abdomen.  Abnormal menstrual periods.  Increasing pain with menstrual periods.  Stopping having menstrual periods without being pregnant. DIAGNOSIS  These cysts are commonly found during a routine or annual pelvic exam. Tests may be ordered to find out more about the cyst. These tests may include:  Ultrasound.  X-ray of the pelvis.  CT scan.  MRI.  Blood tests. TREATMENT  Many ovarian cysts go away on their own without treatment. Your health care provider may want to check your cyst regularly for 2-3 months to see if it changes. For women in menopause, it is particularly important to monitor a cyst closely because of the higher rate of ovarian cancer in menopausal women. When treatment is needed, it may include any of the following:  A procedure to drain the cyst (aspiration). This may be done using a long needle and ultrasound. It can also be done through a laparoscopic procedure. This involves using a thin, lighted tube with a tiny camera on the end (laparoscope) inserted through a small incision.  Surgery to remove the whole cyst. This may be done using laparoscopic  surgery or an open surgery involving a larger incision in the lower abdomen.  Hormone treatment or birth control pills. These methods are sometimes used to help dissolve a cyst. HOME CARE INSTRUCTIONS   Only take over-the-counter or prescription medicines as directed by your health care provider.  Follow up with your health care provider as directed.  Get regular pelvic exams and Pap tests. SEEK MEDICAL CARE IF:   Your periods are late, irregular, or painful, or they stop.  Your pelvic pain or abdominal pain does not go away.  Your abdomen becomes larger or swollen.  You have pressure on your bladder or trouble emptying your bladder completely.  You have pain during sexual intercourse.  You have feelings of fullness, pressure, or discomfort in your stomach.  You lose weight for no apparent reason.  You feel generally ill.  You become constipated.  You lose your appetite.  You develop acne.  You have an increase in body and facial hair.  You are gaining weight, without changing your exercise and eating habits.  You think you are pregnant. SEEK IMMEDIATE MEDICAL CARE IF:   You have increasing abdominal pain.  You feel sick to your stomach (nauseous), and you throw up (vomit).  You develop a fever that comes on suddenly.  You have abdominal pain during  a bowel movement.  Your menstrual periods become heavier than usual. MAKE SURE YOU:  Understand these instructions.  Will watch your condition.  Will get help right away if you are not doing well or get worse.   This information is not intended to replace advice given to you by your health care provider. Make sure you discuss any questions you have with your health care provider.   Document Released: 10/16/2005 Document Revised: 10/21/2013 Document Reviewed: 06/23/2013 Elsevier Interactive Patient Education [employer].

## 2015-10-21 NOTE — Progress Notes (Signed)
   Patient is a 44 year old that presented to the office today for an ultrasound as a result of her right lower quadrant pains and also to check on the IUD to make sure that it has not migrated. Patient reports that Ifor the past several months she's been complaining on and off of right lower quadrant discomfort very little during intercourse. She has severe pain back in August and went to the emergency room and had a normal abdominal CT scan. She does suffer from IBS. Her last colonoscopy was 10 years ago. She states this pain occurs 3-4 times a week sometimes whether without physical activity. Patient had an Mirena IUD placed early this year to help regulate her cycles and to take care of her chronic anemia which she states has helped tremendously. She did visit her gastroenterologist recently and she scheduled for an upper endoscopy and colonoscopy. Patient does have past history of right salpingo-oophorectomy several years ago.  Ultrasound: Uterus measuring 9.8 x 5.8 x 4.5 cm within a major stripe of 3 mm. Right adnexa absent. Prior history of right salpingo-oophorectomy. Left ovary thinwall echo-free cyst measuring 30 x 15 x 25 mm with average size a 23 mm. Septum was reported. No fluid in the cul-de-sac.  Assessment/plan: Chronic right lower quadrant pain on and off probably attributed to pelvic adhesions. Small left ovarian cyst noted. A CA 125 will be obtained today. Patient will return back to the office in 3 months for follow-up ultrasound. If cyst is still present and patient continues with pain and her endoscopy and colonoscopy in normal we discussed about proceeding with a diagnostic laparoscopy possible lysis of adhesion and ovarian cystectomy with conservation of her remaining ovary. If symptoms worsen she'll return back before the three-month follow-up ultrasound.

## 2015-10-22 LAB — CA 125: CA 125: 42 U/mL — ABNORMAL HIGH (ref <35)

## 2015-10-26 ENCOUNTER — Other Ambulatory Visit: Payer: Self-pay | Admitting: Gynecology

## 2015-10-26 ENCOUNTER — Other Ambulatory Visit: Payer: BLUE CROSS/BLUE SHIELD

## 2015-10-26 DIAGNOSIS — N83202 Unspecified ovarian cyst, left side: Secondary | ICD-10-CM

## 2015-10-27 LAB — CA 125: CA 125: 44 U/mL — AB (ref <35)

## 2015-10-30 LAB — OVARIAN MALIGNANCY RISK-ROMA
CA125: 46 U/mL — ABNORMAL HIGH (ref <35)
HE4: 42 pmol (ref <151)
ROMA Postmenopausal: 1.98 (ref <2.77)
ROMA Premenopausal: 0.54 (ref <1.31)

## 2015-11-12 HISTORY — PX: ESOPHAGOGASTRODUODENOSCOPY ENDOSCOPY: SHX5814

## 2015-11-12 HISTORY — PX: COLONOSCOPY W/ BIOPSIES: SHX1374

## 2015-11-19 ENCOUNTER — Telehealth: Payer: Self-pay | Admitting: *Deleted

## 2015-11-19 ENCOUNTER — Ambulatory Visit (INDEPENDENT_AMBULATORY_CARE_PROVIDER_SITE_OTHER): Payer: BLUE CROSS/BLUE SHIELD | Admitting: Gynecology

## 2015-11-19 ENCOUNTER — Encounter: Payer: Self-pay | Admitting: Gynecology

## 2015-11-19 VITALS — BP 128/84

## 2015-11-19 DIAGNOSIS — R102 Pelvic and perineal pain: Secondary | ICD-10-CM | POA: Diagnosis not present

## 2015-11-19 DIAGNOSIS — R971 Elevated cancer antigen 125 [CA 125]: Secondary | ICD-10-CM

## 2015-11-19 DIAGNOSIS — N83202 Unspecified ovarian cyst, left side: Secondary | ICD-10-CM | POA: Diagnosis not present

## 2015-11-19 DIAGNOSIS — F419 Anxiety disorder, unspecified: Secondary | ICD-10-CM

## 2015-11-19 MED ORDER — ALPRAZOLAM 0.25 MG PO TABS: 0.2500 mg | ORAL_TABLET | Freq: Every evening | ORAL | Status: DC | PRN

## 2015-11-19 NOTE — Telephone Encounter (Signed)
Appointment on 12/03/15 @ 10:15am with Dr.Rossi, pt aware. Also Rx for Xanax 0.25 mg to take 1 by mouth daily or twice a day when necessary. Was never placed in Epic per note today did confirm with me that Rx was written. Pt said she has new insurance and was unsure what pharmacy her insurance will have. I will put in Epic for documentation, Rx has not be called in.

## 2015-11-19 NOTE — Progress Notes (Signed)
   Patient is a 45 year old who presented to the office today to discuss her recent ovarian cancer screening blood test. Her history is as follows:  Patient was seen in the office on 10/21/2015 for an ultrasound as a result of her right lower quadrant pains and also to check on the IUD to make sure that it has not migrated. Patient reports that Ifor the past several months she's been complaining on and off of right lower quadrant discomfort but now she describes it across her entire lower abdomen.She has severe pain back in August and went to the emergency room and had a normal abdominal CT scan. She does suffer from IBS. Her last colonoscopy was last month and benign colon polyp was removed and she also had upper endoscopy and some biopsies were obtained results pending.Patient had an Mirena IUD placed early this year to help regulate her cycles and to take care of her chronic anemia which she states has helped tremendously.Patient does have past history of right salpingo-oophorectomy several years ago that from a laparoscopic approach had to be converted to an open abdominal approach due to her extensive abdominal pelvic adhesions.  Ultrasound 10/21/2015: Uterus measuring 9.8 x 5.8 x 4.5 cm within a major stripe of 3 mm. Right adnexa absent. Prior history of right salpingo-oophorectomy. Left ovary thinwall echo-free cyst measuring 30 x 15 x 25 mm with average size a 23 mm. Septum was reported. No fluid in the cul-de-sac. IUD was seen in normal position.  Patient had a ROMA ovarian cancer screening test which indicated her CA-125 was elevated at 46.  Patient is worried and anxious because she has a great-grandmother had ovarian cancer. I would like to refer her to the GYN oncologist to assist in her care. She is well into loose her remaining ovary and I have explained to her that the GYN oncologist could do a frozen section at the time of her surgery and if malignant can proceed then with a staging  procedure versus me doing it and a result coming back malignant and the having to refer her to the GYN oncologist. She fully understands and accepts and we will make an arrangement for her to see them. For her anxiety she was prescribed Xanax 0.25 mg to take 1 by mouth daily or twice a day when necessary.

## 2015-11-19 NOTE — Telephone Encounter (Signed)
-----   Message from Terrance Mass, MD sent at 11/19/2015  3:21 PM EST ----- Anderson Malta please make an appointment for this patient with GYN oncologist as a result of it right ovarian cyst and elevated CA 125

## 2015-12-03 ENCOUNTER — Ambulatory Visit: Payer: BLUE CROSS/BLUE SHIELD | Attending: Gynecologic Oncology | Admitting: Gynecologic Oncology

## 2015-12-03 ENCOUNTER — Encounter: Payer: Self-pay | Admitting: Gynecologic Oncology

## 2015-12-03 VITALS — BP 140/71 | HR 78 | Temp 98.3°F | Resp 18 | Ht 65.0 in | Wt 213.0 lb

## 2015-12-03 DIAGNOSIS — R102 Pelvic and perineal pain: Secondary | ICD-10-CM | POA: Diagnosis not present

## 2015-12-03 DIAGNOSIS — R978 Other abnormal tumor markers: Secondary | ICD-10-CM | POA: Insufficient documentation

## 2015-12-03 DIAGNOSIS — F329 Major depressive disorder, single episode, unspecified: Secondary | ICD-10-CM | POA: Insufficient documentation

## 2015-12-03 DIAGNOSIS — D649 Anemia, unspecified: Secondary | ICD-10-CM | POA: Diagnosis not present

## 2015-12-03 DIAGNOSIS — Z833 Family history of diabetes mellitus: Secondary | ICD-10-CM | POA: Diagnosis not present

## 2015-12-03 DIAGNOSIS — Z8249 Family history of ischemic heart disease and other diseases of the circulatory system: Secondary | ICD-10-CM | POA: Insufficient documentation

## 2015-12-03 DIAGNOSIS — R971 Elevated cancer antigen 125 [CA 125]: Secondary | ICD-10-CM | POA: Diagnosis not present

## 2015-12-03 DIAGNOSIS — Z8041 Family history of malignant neoplasm of ovary: Secondary | ICD-10-CM | POA: Insufficient documentation

## 2015-12-03 DIAGNOSIS — K589 Irritable bowel syndrome without diarrhea: Secondary | ICD-10-CM | POA: Insufficient documentation

## 2015-12-03 DIAGNOSIS — Z9049 Acquired absence of other specified parts of digestive tract: Secondary | ICD-10-CM | POA: Diagnosis not present

## 2015-12-03 DIAGNOSIS — N83209 Unspecified ovarian cyst, unspecified side: Secondary | ICD-10-CM

## 2015-12-03 DIAGNOSIS — Z880 Allergy status to penicillin: Secondary | ICD-10-CM | POA: Insufficient documentation

## 2015-12-03 DIAGNOSIS — N83202 Unspecified ovarian cyst, left side: Secondary | ICD-10-CM | POA: Diagnosis not present

## 2015-12-03 DIAGNOSIS — Z808 Family history of malignant neoplasm of other organs or systems: Secondary | ICD-10-CM | POA: Diagnosis not present

## 2015-12-03 DIAGNOSIS — K219 Gastro-esophageal reflux disease without esophagitis: Secondary | ICD-10-CM | POA: Insufficient documentation

## 2015-12-03 NOTE — Patient Instructions (Addendum)
Preparing for your Surgery  Plan for surgery on December 28, 2015 with Dr. Everitt Amber.  You will be scheduled for a robotic assisted left salpingo-oophorectomy at [facility].  We will be able to hold this time for you until Feb 16.    Pre-operative Testing -You will receive a phone call from presurgical testing at [facility] to arrange for a pre-operative testing appointment before your surgery.  This appointment normally occurs one to two weeks before your scheduled surgery.   -Bring your insurance card, copy of an advanced directive if applicable, medication list  -At that visit, you will be asked to sign a consent for a possible blood transfusion in case a transfusion becomes necessary during surgery.  The need for a blood transfusion is rare but having consent is a necessary part of your care.     -You should not be taking blood thinners or aspirin at least ten days prior to surgery unless instructed by your surgeon.  Day Before Surgery at [facility] will be asked to take in only clear liquids the day before surgery.  Examples of clear liquids include broths, jello, and clear juices.  Avoid carbonated beverages.  You will be advised to have nothing to eat or drink after midnight the evening before.    Your role in recovery Your role is to become active as soon as directed by your doctor, while still giving yourself time to heal.  Rest when you feel tired. You will be asked to do the following in order to speed your recovery:  - Cough and breathe deeply. This helps toclear and expand your lungs and can prevent pneumonia. You may be given a spirometer to practice deep breathing. A staff member will show you how to use the spirometer. - Do mild physical activity. Walking or moving your legs help your circulation and body functions return to normal. A staff member will help you when you try to walk and will provide you with simple exercises. Do not try to get up  or walk alone the first time. - Actively manage your pain. Managing your pain lets you move in comfort. We will ask you to rate your pain on a scale of zero to 10. It is your responsibility to tell your doctor or nurse where and how much you hurt so your pain can be treated.  Special Considerations -If you are diabetic, you may be placed on insulin after surgery to have closer control over your blood sugars to promote healing and recovery.  This does not mean that you will be discharged on insulin.  If applicable, your oral antidiabetics will be resumed when you are tolerating a solid diet.  -Your final pathology results from surgery should be available by the Friday after surgery and the results will be relayed to you when available.  Blood Transfusion Information WHAT IS A BLOOD TRANSFUSION? A transfusion is the replacement of blood or some of its parts. Blood is made up of multiple cells which provide different functions.  Red blood cells carry oxygen and are used for blood loss replacement.  White blood cells fight against infection.  Platelets control bleeding.  Plasma helps clot blood.  Other blood products are available for specialized needs, such as hemophilia or other clotting disorders. BEFORE THE TRANSFUSION  Who gives blood for transfusions?   You may be able to donate blood to be used at a later date on yourself (autologous donation).  Relatives can be asked to  donate blood. This is generally not any safer than if you have received blood from a stranger. The same precautions are taken to ensure safety when a relative's blood is donated.  Healthy volunteers who are fully evaluated to make sure their blood is safe. This is blood bank blood. Transfusion therapy is the safest it has ever been in the practice of medicine. Before blood is taken from a donor, a complete history is taken to make sure that person has no history of diseases nor engages in risky social behavior  (examples are intravenous drug use or sexual activity with multiple partners). The donor's travel history is screened to minimize risk of transmitting infections, such as malaria. The donated blood is tested for signs of infectious diseases, such as HIV and hepatitis. The blood is then tested to be sure it is compatible with you in order to minimize the chance of a transfusion reaction. If you or a relative donates blood, this is often done in anticipation of surgery and is not appropriate for emergency situations. It takes many days to process the donated blood. RISKS AND COMPLICATIONS Although transfusion therapy is very safe and saves many lives, the main dangers of transfusion include:   Getting an infectious disease.  Developing a transfusion reaction. This is an allergic reaction to something in the blood you were given. Every precaution is taken to prevent this. The decision to have a blood transfusion has been considered carefully by your caregiver before blood is given. Blood is not given unless the benefits outweigh the risks.

## 2015-12-03 NOTE — Progress Notes (Signed)
Consult Note: Gyn-Onc  Consult was requested by Dr. Toney Rakes for the evaluation of JENAFER WINTERTON 45 y.o. female  CC:  Chief Complaint  Patient presents with  . Ovarian Cyst    New Consultation  . Elevated tumor markers    New Consultation    Assessment/Plan:  Ms. LACY SOFIA  is a 45 y.o.  year old with a 3cm simple left ovarian cyst and a mildly elevated CA 125 (in the setting of normal ROMA and HE4).  I personally reviewed Matty's [place]. I have a very low suspicion for malignancy given the appearance on [place] of this cyst, and given the low elevation in CA 125 in the setting of otherwise normal more robust tumor markers. I discussed with Ember that the CA 125 test has good sensitivity but poor specificity.  After extensive discussion with the patient, it appears that her greatest frustration and desire for surgery surrounds her pelvic pain symptoms. She had right sided pelvic pain in 2006 and was treated with a surgery (RSO) which resulted in resolution of the pain for 10 years. She hopes for a similar outcome this time. Review of her records though suggest that the ovary looked grossly normal and pathology was unremarkable. There was some adhesive disease to the pelvis, but this does not sound consistent with what would have caused her pain.  I discussed that there are complex etiologies behind pelvic pain, and simple adhesive disease is typically a rare cause. I discussed that surgery induces more adhesive disease and can result in increased pain. I discussed that surgical risks increase substantially with each surgical effort. I discussed that she is at particular risk for damage to internal visceral organs, conversion to laparotomy, reoperation or infection. I discussed that being obese (BMI >35) increases her risk for this.   I offered her referral to a pelvic pain specialist who would consider non-surgical and surgical options and better explore possible etiologies of her pain.  The  patient is electing for surgery. We will perform a robotic assisted laparoscopic left salpingo-oophorectomy. We will perform frozen section on the cyst, and if malignancy is identified, complete a staging procedure. She will require postoperative estrogen replacement and I recommend keeping the Mirena IUD to provide progestin coverage of the uterus. I do not see an indication for hysterectomy in this patient and it would substantially increase risk.  If, after she has met with pelvic pain specialists, she is declining surgery with [place], she will notify us and we will cancel her scheduled operative appointment.   HPI: Sion Thane is a 45 year old P2 who is seen in consultation at the request of Dr Toney Rakes for pelvic pain and a left ovarian cyst and elevated CA 125.  The patient reports having RLQ pain in 2006. She was taken to the OR with Dr Toney Rakes for an attempted laparoscopy, but this was converted to laparotomy after adhesive disease between the omentum and pelvic abdominal wall was encountered. Of interst, prior to that surgery her only abdominal surgery had been a laparoscopic cholecystectomy the prior year.  I have reviewed the operative note from that surgery and it appears that there were adhesive disease between the sigmoid colon and left ovary that were sharply taken down there was some colonic fat is using 2 structures around the right ovary but the ovary itself was grossly normal. Due to her symptoms of right-sided pain the ovary and tube on that side was removed. It was pathologically normal with no evidence  of endometriosis or other abnormalities. No clear endometriosis lesions were identified in the pelvis at the time of that surgery. The patient healed well from her laparotomy incision however does report that it was a prolonged recovery with substantial pain and fatigue.  She remained fairly pain free for approximately 10 years but then in late 2016 developed intermittent right-sided  abdominal pains. There times became so severe that she required admission to the emergency room. A CT urogram performed in August 2016 for an ER admission for pain was grossly normal with no stones and normal reproductive organs identified. Of note there was no ovarian cyst seen at that time. Earlier in 2016 she had a Mirena IUD placed to help regulate her distal cycles and chronic anemia and this helped tremendously. She is amenorrheic on the IUD. However her abdominal and pelvic pain continued. She reports that it is intermittent, seems to be somewhat cyclical, is associated with bloating, starts in the right side and spread across the abdomen and into the back. She feels that is worse with eating. The patient suspects it is secondary to adhesive disease.  As part of workup for these persistent symptoms she underwent a transvaginal ultrasound on 10/21/2015. This revealed a uterus measuring 9.8 x 5.8 x 4.5 cm with endometrial stripe of 3 mm. The right adnexa was surgically absent. The left ovary contained thin-walled echo free cyst measuring 30 x 15 x 25 mm was no fluid in the cul-de-sac. A septum was identified within the cyst. Tumor markers were drawn on 10/26/15 and these were slightly abnormal with an elevated CA-125 of 46 units per milliliter (upper limit of normal with this test is 35), but a normal ROMA test of 0.54 and a normal HE for a 40.  The patient was anxious regarding these results because her maternal great-grandmother died from ovarian cancer at age 27. She however has no other family history or personal history of breast and ovarian cancer. She required prescription of Xanax after learning of the tumor marker elevation due to her anxiety.  The patient is otherwise fairly healthy. She is obese with a BMI 35 kg/m. She has had abdominal surgeries including a tubal ligation, a right stopping oophorectomy and lysis of adhesions via a pfannenstiel incision, and a laparoscopic cholecystectomy. She  has been diagnosis prediabetic based on HbA1c, however has normalized her HbA1c with diet and exercise. 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 Her daughter is getting married in November 2017 and the patient wishes to be pain-free in time for this event.  Current Meds:  Outpatient Encounter Prescriptions as of 12/03/2015  Medication Sig  . ALPRAZolam (XANAX) 0.25 MG tablet Take 1 tablet (0.25 mg total) by mouth at bedtime as needed for anxiety.  Marland Kitchen ibuprofen (ADVIL,MOTRIN) 800 MG tablet Take 1 tablet (800 mg total) by mouth every 8 (eight) hours as needed. for pain  . levonorgestrel (MIRENA) 20 MCG/24HR IUD 1 Intra Uterine Device (1 each total) by Intrauterine route once.  Marland Kitchen omeprazole (PRILOSEC OTC) 20 MG tablet Take 20 mg by mouth daily.   Marland Kitchen spironolactone (ALDACTONE) 25 MG tablet TAKE 1 TABLET (25 MG TOTAL) BY MOUTH DAILY.  . cetirizine (ZYRTEC) 10 MG tablet Take 10 mg by mouth daily. Reported on 12/03/2015  . meloxicam (MOBIC) 15 MG tablet Reported on 12/03/2015  . [DISCONTINUED] oxyCODONE-acetaminophen (PERCOCET/ROXICET) 5-325 MG per tablet Take 1 tablet by mouth every 8 (eight) hours as needed for severe pain. (Patient not taking: Reported  on 10/04/2015)   No facility-administered encounter medications on file as of 12/03/2015.    Allergy:  Allergies  Allergen Reactions  . Junel Fe 1-20 [Norethin Ace-Eth Estrad-Fe] Other (See Comments)     migraine  With visual aura ;elevated blood pressures  . Penicillins   . Vicodin [Hydrocodone-Acetaminophen] Itching    Social Hx:   Social History   Social History  . Marital Status: Married    Spouse Name: N/A  . Number of Children: N/A  . Years of Education: N/A   Occupational History  . Not on file.   Social History Main Topics  . Smoking status: Never Smoker   . Smokeless tobacco: Never Used  . Alcohol Use: 0.0 oz/week    0 Standard drinks or equivalent per week  . Drug Use: No  . Sexual Activity: Yes    Other Topics Concern  . Not on file   Social History Narrative   Occupation: unemployed   Married   [place] of 3-4    Pet dogs 2   G3P2             Past Surgical Hx:  Past Surgical History  Procedure Laterality Date  . Cholecystectomy    . Tubal ligation  09/2005    scar tissue removal ovarian cyst  . Ligament repair  2002    Rt ankle  . [place], arthritis and cartilage repair  01/11/2011    on Rt knee  . Knee arthroscopy  2012    right knee,,, left knee at 15  . Colonoscopy  08/03/2005  . Pelvic laparoscopy  10/11/2005    LAPAROTOMY, LYSIS OF PELVIC ADHESIONS, RSO AND CAUTERIZATION AND TRANSECTION OF LEFT FALLOPIAN TUBE  . Foot surgery    . Colonoscopy w/ biopsies  Nov 12 2015  . Esophagogastroduodenoscopy endoscopy  Nov 12 2015    Past Medical Hx:  Past Medical History  Diagnosis Date  . Allergy   . Depression   . GERD (gastroesophageal reflux disease)   . Migraines   . Heavy menses     on ocps  . Anemia   . IBS (irritable bowel syndrome)     by hx and had a neg colonscopy  . ANEMIA 12/15/2009    Qualifier: Diagnosis of  By: Regis Bill MD, Standley Brooking     Past Gynecological History:  SVD x 3  No LMP recorded. Patient is not currently having periods (Reason: IUD).  Family Hx:  Family History  Problem Relation Age of Onset  . Arthritis Mother   . Hypertension Mother   . Cancer Mother     MELANOMA  . Hyperlipidemia Mother   . Hypertension Maternal Grandmother   . Dementia Maternal Grandmother   . Hypertension Maternal Grandfather   . Cancer Maternal Grandfather     MELANOMA  . Heart disease Paternal Grandmother   . Diabetes Paternal Grandfather   . Heart disease Paternal Grandfather   . Heart disease Father   . Cancer - Ovarian Other     Mat Great Grandmother    Review of Systems:  Constitutional  Feels well,    ENT Normal appearing ears and nares bilaterally Skin/Breast  No rash, sores, jaundice, itching, dryness Cardiovascular  No chest pain,  shortness of breath, or edema  Pulmonary  No cough or wheeze.  Gastro Intestinal  No nausea, vomitting, or diarrhoea. No bright red blood per rectum, + abdominal pain, change in bowel movement, or constipation.  Genito Urinary  No frequency, urgency, dysuria, see  HPI Musculo Skeletal  No myalgia, arthralgia, joint swelling or pain  Neurologic  No weakness, numbness, change in gait,  Psychology  No depression, anxiety, insomnia.   Vitals:  Blood pressure 140/71, pulse 78, temperature 98.3 F (36.8 C), temperature source Oral, resp. rate 18, height 5' 5"  (1.651 m), weight 213 lb (96.616 kg), SpO2 99 %.  Physical Exam: WD in NAD Neck  Supple NROM, without any enlargements.  Lymph Node Survey No cervical supraclavicular or inguinal adenopathy Cardiovascular  Pulse normal rate, regularity and rhythm. S1 and S2 normal.  Lungs  Clear to auscultation bilateraly, without wheezes/crackles/rhonchi. Good air movement.  Skin  No rash/lesions/breakdown  Psychiatry  Alert and oriented to person, place, and time  Abdomen  Normoactive bowel sounds, abdomen soft, non-tender and obese without evidence of hernia.  Back No CVA tenderness Genito Urinary  Vulva/vagina: Normal external female genitalia.  No lesions. No discharge or bleeding.  Bladder/urethra:  No lesions or masses, well supported bladder  Vagina: normal  Cervix: Normal appearing, no lesions.  Uterus:  Small, mobile, no parametrial involvement or nodularity.  Adnexa: no palpable masses. Rectal  Good tone, no masses no cul de sac nodularity.  Extremities  No bilateral cyanosis, clubbing or edema.   Donaciano Eva, MD  12/03/2015, 11:18 AM

## 2015-12-07 ENCOUNTER — Telehealth: Payer: Self-pay

## 2015-12-07 NOTE — Telephone Encounter (Signed)
Patient contcated and updated that the referral has been placed for 2nd opinion to [facility] [place]: [phone] , fax : [phone] . Attempted to reach the patient  No answer , left a detailed message with call back information provided.

## 2015-12-16 ENCOUNTER — Telehealth: Payer: Self-pay

## 2015-12-16 NOTE — Telephone Encounter (Signed)
Follow up call placed to see if the patient was contacted by [facility] , [place]: [phone] , fax : [phone] . Patient states she does have an appointment with [facility] and is leaning towards not following through with the surgery . Updated Joylene John , APNP , our surgical department has been contacted to cancel Ms Roundtree's surgery scheduled for 12/28/2015 , per orders.

## 2015-12-28 ENCOUNTER — Encounter (HOSPITAL_COMMUNITY): Admission: RE | Payer: Self-pay | Source: Ambulatory Visit

## 2015-12-28 ENCOUNTER — Ambulatory Visit (HOSPITAL_COMMUNITY)
Admission: RE | Admit: 2015-12-28 | Payer: BLUE CROSS/BLUE SHIELD | Source: Ambulatory Visit | Admitting: Gynecologic Oncology

## 2015-12-28 SURGERY — SALPINGO-OOPHORECTOMY, ROBOT-ASSISTED
Anesthesia: General | Laterality: Left

## 2016-02-14 DIAGNOSIS — Z6834 Body mass index (BMI) 34.0-34.9, adult: Secondary | ICD-10-CM | POA: Diagnosis not present

## 2016-02-14 DIAGNOSIS — R1031 Right lower quadrant pain: Secondary | ICD-10-CM | POA: Diagnosis not present

## 2016-04-14 DIAGNOSIS — S63501A Unspecified sprain of right wrist, initial encounter: Secondary | ICD-10-CM | POA: Diagnosis not present

## 2016-04-14 DIAGNOSIS — M25511 Pain in right shoulder: Secondary | ICD-10-CM | POA: Diagnosis not present

## 2016-04-14 DIAGNOSIS — M25521 Pain in right elbow: Secondary | ICD-10-CM | POA: Diagnosis not present

## 2016-04-20 DIAGNOSIS — S63501A Unspecified sprain of right wrist, initial encounter: Secondary | ICD-10-CM | POA: Diagnosis not present

## 2016-04-20 DIAGNOSIS — S56911A Strain of unspecified muscles, fascia and tendons at forearm level, right arm, initial encounter: Secondary | ICD-10-CM | POA: Diagnosis not present

## 2016-04-27 DIAGNOSIS — M25521 Pain in right elbow: Secondary | ICD-10-CM | POA: Diagnosis not present

## 2016-04-27 DIAGNOSIS — M25511 Pain in right shoulder: Secondary | ICD-10-CM | POA: Diagnosis not present

## 2016-05-01 DIAGNOSIS — M25511 Pain in right shoulder: Secondary | ICD-10-CM | POA: Diagnosis not present

## 2016-05-01 DIAGNOSIS — M25521 Pain in right elbow: Secondary | ICD-10-CM | POA: Diagnosis not present

## 2016-05-04 ENCOUNTER — Other Ambulatory Visit: Payer: Self-pay | Admitting: *Deleted

## 2016-05-04 DIAGNOSIS — M25521 Pain in right elbow: Secondary | ICD-10-CM | POA: Diagnosis not present

## 2016-05-04 DIAGNOSIS — M25511 Pain in right shoulder: Secondary | ICD-10-CM | POA: Diagnosis not present

## 2016-05-04 MED ORDER — IBUPROFEN 800 MG PO TABS: 800.0000 mg | ORAL_TABLET | Freq: Three times a day (TID) | ORAL | Status: DC | PRN

## 2016-05-04 NOTE — Telephone Encounter (Signed)
Last filled in may 26,2016 with 5 refills

## 2016-05-24 DIAGNOSIS — M25511 Pain in right shoulder: Secondary | ICD-10-CM | POA: Diagnosis not present

## 2016-05-24 DIAGNOSIS — M25521 Pain in right elbow: Secondary | ICD-10-CM | POA: Diagnosis not present

## 2016-05-29 ENCOUNTER — Other Ambulatory Visit (INDEPENDENT_AMBULATORY_CARE_PROVIDER_SITE_OTHER): Payer: BLUE CROSS/BLUE SHIELD

## 2016-05-29 DIAGNOSIS — Z0000 Encounter for general adult medical examination without abnormal findings: Secondary | ICD-10-CM

## 2016-05-29 LAB — BASIC METABOLIC PANEL
BUN: 18 mg/dL (ref 6–23)
CO2: 26 meq/L (ref 19–32)
Calcium: 9.6 mg/dL (ref 8.4–10.5)
Chloride: 105 mEq/L (ref 96–112)
Creatinine, Ser: 0.8 mg/dL (ref 0.40–1.20)
GFR: 81.59 mL/min (ref >60.00)
GLUCOSE: 110 mg/dL — AB (ref 70–99)
POTASSIUM: 4.1 meq/L (ref 3.5–5.1)
SODIUM: 138 meq/L (ref 135–145)

## 2016-05-29 LAB — LIPID PANEL
CHOL/HDL RATIO: 3
Cholesterol: 151 mg/dL (ref 0–200)
HDL: 50.9 mg/dL (ref >39.00)
LDL Cholesterol: 88 mg/dL (ref 0–99)
NONHDL: 100.09
Triglycerides: 61 mg/dL (ref 0.0–149.0)
VLDL: 12.2 mg/dL (ref 0.0–40.0)

## 2016-05-29 LAB — HEPATIC FUNCTION PANEL
ALK PHOS: 121 U/L — AB (ref 39–117)
ALT: 11 U/L (ref 0–35)
AST: 13 U/L (ref 0–37)
Albumin: 4.1 g/dL (ref 3.5–5.2)
BILIRUBIN DIRECT: 0 mg/dL (ref 0.0–0.3)
BILIRUBIN TOTAL: 0.5 mg/dL (ref 0.2–1.2)
TOTAL PROTEIN: 7 g/dL (ref 6.0–8.3)

## 2016-05-29 LAB — CBC WITH DIFFERENTIAL/PLATELET
BASOS ABS: 0 10*3/uL (ref 0.0–0.1)
Basophils Relative: 0.6 % (ref 0.0–3.0)
EOS PCT: 1.1 % (ref 0.0–5.0)
Eosinophils Absolute: 0.1 10*3/uL (ref 0.0–0.7)
HEMATOCRIT: 41.5 % (ref 36.0–46.0)
HEMOGLOBIN: 14.1 g/dL (ref 12.0–15.0)
LYMPHS ABS: 1.8 10*3/uL (ref 0.7–4.0)
LYMPHS PCT: 21.5 % (ref 12.0–46.0)
MCHC: 33.8 g/dL (ref 30.0–36.0)
MCV: 87.9 fl (ref 78.0–100.0)
MONOS PCT: 5.5 % (ref 3.0–12.0)
Monocytes Absolute: 0.4 10*3/uL (ref 0.1–1.0)
NEUTROS PCT: 71.3 % (ref 43.0–77.0)
Neutro Abs: 5.8 10*3/uL (ref 1.4–7.7)
Platelets: 232 10*3/uL (ref 150.0–400.0)
RBC: 4.72 Mil/uL (ref 3.87–5.11)
RDW: 13.3 % (ref 11.5–15.5)
WBC: 8.2 10*3/uL (ref 4.0–10.5)

## 2016-05-29 LAB — TSH: TSH: 2.4 u[IU]/mL (ref 0.35–4.50)

## 2016-05-29 LAB — HEMOGLOBIN A1C: Hgb A1c MFr Bld: 5.9 % (ref 4.6–6.5)

## 2016-06-02 DIAGNOSIS — M25511 Pain in right shoulder: Secondary | ICD-10-CM | POA: Diagnosis not present

## 2016-06-02 DIAGNOSIS — M25521 Pain in right elbow: Secondary | ICD-10-CM | POA: Diagnosis not present

## 2016-06-02 NOTE — Progress Notes (Signed)
Pre visit review using our clinic review tool, if applicable. No additional management support is needed unless otherwise documented below in the visit note.  Chief Complaint  Patient presents with  . Annual Exam    HPI: Patient  Brandy Maynard  45 y.o. comes in today for [place] visit  Had fracture slip and fall on vacation marble floor and fx right elbow  Current better  ( June)  Wasn't able to exercise as much and aggravated mood and  Eating weight  Doing ok   Had gyne check  Following  Pain  Episodes  Not cancer  See gyne notes.  Rare [place] of alprazolam  Every few mos   Taking Prilosec about every  3 days    Health Maintenance  Topic Date Due  . PNEUMOCOCCAL POLYSACCHARIDE VACCINE (1) 01/13/1971  . FOOT EXAM  01/13/1979  . OPHTHALMOLOGY EXAM  01/13/1979  . URINE MICROALBUMIN  01/13/1979  . PAP SMEAR  06/03/2015  . INFLUENZA VACCINE  05/30/2016  . HIV Screening  06/05/2017 (Originally 01/13/1984)  . MAMMOGRAM  06/07/2016  . HEMOGLOBIN A1C  11/29/2016  . TETANUS/TDAP  04/29/2022   Health Maintenance Review LIFESTYLE:  Exercise:  Recently back at gym Tobacco/ETS:n Alcohol: per day not much Sugar beverages:n Sleep:interrupted  Drug use: no Daughter getting married   One going to college    ROS:  GEN/ HEENT: No fever, significant weight changes sweats headaches vision problems hearing changes, CV/ PULM; No chest pain shortness of breath cough, syncope,edema  change in exercise tolerance. GI /GU: No adominal pain, vomiting, change in bowel habits. No blood in the stool. No significant GU symptoms. SKIN/HEME: ,no acute skin rashes suspicious lesions or bleeding. No lymphadenopathy, nodules, masses.  NEURO/ PSYCH:  No neurologic signs such as weakness numbness. No depression anxiety. IMM/ Allergy: No unusual infections.  Allergy .   REST of 12 system review negative except as per HPI   Past Medical History:  Diagnosis Date  . Allergy   . Anemia   .  ANEMIA 12/15/2009   Qualifier: Diagnosis of  By: Regis Bill MD, Standley Brooking   . Depression   . GERD (gastroesophageal reflux disease)   . Heavy menses    on ocps  . IBS (irritable bowel syndrome)    by hx and had a neg colonscopy  . Migraines     Past Surgical History:  Procedure Laterality Date  . CHOLECYSTECTOMY    . COLONOSCOPY  08/03/2005  . COLONOSCOPY W/ BIOPSIES  Nov 12 2015  . ESOPHAGOGASTRODUODENOSCOPY ENDOSCOPY  Nov 12 2015  . FOOT SURGERY    . KNEE ARTHROSCOPY  2012   right knee,,, left knee at 15  . LIGAMENT REPAIR  2002   Rt ankle  . PELVIC LAPAROSCOPY  10/11/2005   LAPAROTOMY, LYSIS OF PELVIC ADHESIONS, RSO AND CAUTERIZATION AND TRANSECTION OF LEFT FALLOPIAN TUBE  . PLICA, arthritis and cartilage repair  01/11/2011   on Rt knee  . TUBAL LIGATION  09/2005   scar tissue removal ovarian cyst    Family History  Problem Relation Age of Onset  . Arthritis Mother   . Hypertension Mother   . Cancer Mother     MELANOMA  . Hyperlipidemia Mother   . Hypertension Maternal Grandmother   . Dementia Maternal Grandmother   . Hypertension Maternal Grandfather   . Cancer Maternal Grandfather     MELANOMA  . Heart disease Paternal Grandmother   . Diabetes Paternal Grandfather   . Heart  disease Paternal Grandfather   . Heart disease Father   . Cancer - Ovarian Other     Mat Great Grandmother    Social History   Social History  . Marital status: Married    Spouse name: N/A  . Number of children: N/A  . Years of education: N/A   Social History Main Topics  . Smoking status: Never Smoker  . Smokeless tobacco: Never Used  . Alcohol use 0.0 oz/week  . Drug use: No  . Sexual activity: Yes   Other Topics Concern  . None   Social History Narrative   Occupation: unemployed   Married   [place] of 3-4    Pet dogs 2   G3P2             Outpatient Medications Prior to Visit  Medication Sig Dispense Refill  . cetirizine (ZYRTEC) 10 MG tablet Take 10 mg by mouth daily.  Reported on 12/03/2015    . ibuprofen (ADVIL,MOTRIN) 800 MG tablet Take 1 tablet (800 mg total) by mouth every 8 (eight) hours as needed. for pain 30 tablet 0  . levonorgestrel (MIRENA) 20 MCG/24HR IUD 1 Intra Uterine Device (1 each total) by Intrauterine route once. 1 each 0  . omeprazole (PRILOSEC OTC) 20 MG tablet Take 20 mg by mouth daily.     Marland Kitchen ALPRAZolam (XANAX) 0.25 MG tablet Take 1 tablet (0.25 mg total) by mouth at bedtime as needed for anxiety. (Patient not taking: Reported on 06/05/2016) 30 tablet 0  . meloxicam (MOBIC) 15 MG tablet Reported on 12/03/2015  1  . spironolactone (ALDACTONE) 25 MG tablet TAKE 1 TABLET (25 MG TOTAL) BY MOUTH DAILY. 30 tablet 10   No facility-administered medications prior to visit.      EXAM:  BP 110/80   Temp 98.2 F (36.8 C) (Oral)   Ht 5' 5.25" (1.657 m)   Wt 210 lb 3.2 oz (95.3 kg)   BMI 34.71 kg/m   Body mass index is 34.71 kg/m.  Physical Exam: Vital signs reviewed [id number] is a well-developed well-nourished alert cooperative    who appearsr stated age in no acute distress.  HEENT: normocephalic atraumatic , Eyes: PERRL EOM's full, conjunctiva clear, Nares: paten,t no deformity discharge or tenderness., Ears: no deformity EAC's clear TMs with normal landmarks. Mouth: clear OP, no lesions, edema.  Moist mucous membranes. Dentition in adequate repair. NECK: supple without masses, thyromegaly or bruits. CHEST/PULM:  Clear to auscultation and percussion breath sounds equal no wheeze , rales or rhonchi. No chest wall deformities or tenderness. CV: PMI is nondisplaced, S1 S2 no gallops, murmurs, rubs. Peripheral pulses are full without delay.No JVD . Breast: normal by inspection . No dimpling, discharge, masses, tenderness or discharge . ABDOMEN: Bowel sounds normal nontender  No guard or rebound, no hepato splenomegal no CVA tenderness.  No hernia. Extremtities:  No clubbing cyanosis or edema, no acute joint swelling or redness no focal  atrophy NEURO:  Oriented x3, cranial nerves 3-12 appear to be intact, no obvious focal weakness,gait within normal limits no abnormal reflexes or asymmetrical SKIN: No acute rashes normal turgor, color, no bruising or petechiae. PSYCH: Oriented, good eye contact, no obvious depression anxiety, cognition and judgment appear normal. LN: no cervical axillary inguinal adenopathy  Lab Results  Component Value Date   WBC 8.2 05/29/2016   HGB 14.1 05/29/2016   HCT 41.5 05/29/2016   PLT 232.0 05/29/2016   GLUCOSE 110 (H) 05/29/2016   CHOL 151 05/29/2016   TRIG 61.0  05/29/2016   HDL 50.90 05/29/2016   LDLCALC 88 05/29/2016   ALT 11 05/29/2016   AST 13 05/29/2016   NA 138 05/29/2016   K 4.1 05/29/2016   CL 105 05/29/2016   CREATININE 0.80 05/29/2016   BUN 18 05/29/2016   CO2 26 05/29/2016   TSH 2.40 05/29/2016   HGBA1C 5.9 05/29/2016    ASSESSMENT AND PLAN:  Discussed the following assessment and plan:  Visit for preventive health examination  Hyperglycemia - follwo a1c  lsi at this time  - Plan: Hemoglobin A1c  Alkaline phosphatase raised - minor elevation had fractyre in june check fro vit d deficiency - Plan: Hepatic function panel, Gamma GT, VITAMIN D 25 Hydroxy (Vit-D Deficiency, Fractures)  Medication management  Patient Care Team: Burnis Medin, MD as PCP - General Vickey Huger, MD (Orthopedic Surgery) Terrance Mass, MD (Obstetrics and Gynecology) Jerrell Belfast, MD (Otolaryngology) Patient Instructions  Continue  Attention to lifestyle intervention healthy eating and exercise .  Lab in about 3 months can repeat the hg a1c and  Repeat alkalin phosphotase  And  Get a vit d level . At that time.  If all ok    Check yearly  cpx and labs with hg a1c or as  Needed    Standley Brooking. Panosh M.D.

## 2016-06-05 ENCOUNTER — Ambulatory Visit (INDEPENDENT_AMBULATORY_CARE_PROVIDER_SITE_OTHER): Payer: BLUE CROSS/BLUE SHIELD | Admitting: Internal Medicine

## 2016-06-05 ENCOUNTER — Encounter: Payer: Self-pay | Admitting: Internal Medicine

## 2016-06-05 VITALS — BP 110/80 | Temp 98.2°F | Ht 65.25 in | Wt 210.2 lb

## 2016-06-05 DIAGNOSIS — R748 Abnormal levels of other serum enzymes: Secondary | ICD-10-CM

## 2016-06-05 DIAGNOSIS — Z0000 Encounter for general adult medical examination without abnormal findings: Secondary | ICD-10-CM

## 2016-06-05 DIAGNOSIS — Z79899 Other long term (current) drug therapy: Secondary | ICD-10-CM | POA: Diagnosis not present

## 2016-06-05 DIAGNOSIS — R739 Hyperglycemia, unspecified: Secondary | ICD-10-CM | POA: Diagnosis not present

## 2016-06-05 NOTE — Patient Instructions (Signed)
Continue  Attention to lifestyle intervention healthy eating and exercise .  Lab in about 3 months can repeat the hg a1c and  Repeat alkalin phosphotase  And  Get a vit d level . At that time.  If all ok    Check yearly  cpx and labs with hg a1c or as  Needed

## 2016-06-06 DIAGNOSIS — M25521 Pain in right elbow: Secondary | ICD-10-CM | POA: Diagnosis not present

## 2016-06-06 DIAGNOSIS — M25511 Pain in right shoulder: Secondary | ICD-10-CM | POA: Diagnosis not present

## 2016-08-18 ENCOUNTER — Encounter: Payer: Self-pay | Admitting: Gynecology

## 2016-08-18 DIAGNOSIS — Z1231 Encounter for screening mammogram for malignant neoplasm of breast: Secondary | ICD-10-CM | POA: Diagnosis not present

## 2016-09-06 ENCOUNTER — Other Ambulatory Visit (INDEPENDENT_AMBULATORY_CARE_PROVIDER_SITE_OTHER): Payer: BLUE CROSS/BLUE SHIELD

## 2016-09-06 DIAGNOSIS — R748 Abnormal levels of other serum enzymes: Secondary | ICD-10-CM | POA: Diagnosis not present

## 2016-09-06 DIAGNOSIS — R739 Hyperglycemia, unspecified: Secondary | ICD-10-CM

## 2016-09-06 LAB — GAMMA GT: GGT: 12 U/L (ref 7–51)

## 2016-09-06 LAB — HEPATIC FUNCTION PANEL
ALK PHOS: 112 U/L (ref 39–117)
ALT: 11 U/L (ref 0–35)
AST: 14 U/L (ref 0–37)
Albumin: 4.1 g/dL (ref 3.5–5.2)
BILIRUBIN DIRECT: 0.1 mg/dL (ref 0.0–0.3)
TOTAL PROTEIN: 6.6 g/dL (ref 6.0–8.3)
Total Bilirubin: 0.7 mg/dL (ref 0.2–1.2)

## 2016-09-06 LAB — HEMOGLOBIN A1C: Hgb A1c MFr Bld: 5.9 % (ref 4.6–6.5)

## 2016-09-06 LAB — VITAMIN D 25 HYDROXY (VIT D DEFICIENCY, FRACTURES): VITD: 31.05 ng/mL (ref 30.00–100.00)

## 2016-10-06 ENCOUNTER — Ambulatory Visit (INDEPENDENT_AMBULATORY_CARE_PROVIDER_SITE_OTHER): Payer: BLUE CROSS/BLUE SHIELD | Admitting: Gynecology

## 2016-10-06 ENCOUNTER — Encounter: Payer: Self-pay | Admitting: Gynecology

## 2016-10-06 VITALS — BP 128/76 | Ht 65.0 in | Wt 213.0 lb

## 2016-10-06 DIAGNOSIS — B9689 Other specified bacterial agents as the cause of diseases classified elsewhere: Secondary | ICD-10-CM

## 2016-10-06 DIAGNOSIS — B3731 Acute candidiasis of vulva and vagina: Secondary | ICD-10-CM

## 2016-10-06 DIAGNOSIS — N898 Other specified noninflammatory disorders of vagina: Secondary | ICD-10-CM

## 2016-10-06 DIAGNOSIS — N760 Acute vaginitis: Secondary | ICD-10-CM

## 2016-10-06 DIAGNOSIS — Z8742 Personal history of other diseases of the female genital tract: Secondary | ICD-10-CM | POA: Diagnosis not present

## 2016-10-06 DIAGNOSIS — B373 Candidiasis of vulva and vagina: Secondary | ICD-10-CM | POA: Diagnosis not present

## 2016-10-06 DIAGNOSIS — Z01411 Encounter for gynecological examination (general) (routine) with abnormal findings: Secondary | ICD-10-CM

## 2016-10-06 DIAGNOSIS — Z23 Encounter for immunization: Secondary | ICD-10-CM

## 2016-10-06 LAB — WET PREP FOR TRICH, YEAST, CLUE: TRICH WET PREP: NONE SEEN

## 2016-10-06 MED ORDER — FLUCONAZOLE 150 MG PO TABS: ORAL_TABLET | ORAL | 0 refills | Status: DC

## 2016-10-06 MED ORDER — TINIDAZOLE 500 MG PO TABS: ORAL_TABLET | ORAL | 0 refills | Status: DC

## 2016-10-06 NOTE — Patient Instructions (Addendum)
Influenza Virus Vaccine (Flucelvax) What is this medicine? INFLUENZA VIRUS VACCINE (in floo EN zuh VAHY ruhs vak SEEN) helps to reduce the risk of getting influenza also known as the flu. The vaccine only helps protect you against some strains of the flu. COMMON BRAND NAME(S): FLUCELVAX What should I tell my health care provider before I take this medicine? They need to know if you have any of these conditions: -bleeding disorder like hemophilia -fever or infection -Guillain-Barre syndrome or other neurological problems -immune system problems -infection with the human immunodeficiency virus (HIV) or AIDS -low blood platelet counts -multiple sclerosis -an unusual or allergic reaction to influenza virus vaccine, other medicines, foods, dyes or preservatives -pregnant or trying to get pregnant -breast-feeding How should I use this medicine? This vaccine is for injection into a muscle. It is given by a health care professional. A copy of Vaccine Information Statements will be given before each vaccination. Read this sheet carefully each time. The sheet may change frequently. Talk to your pediatrician regarding the use of this medicine in children. Special care may be needed. Overdosage: If you think you've taken too much of this medicine contact a poison control center or emergency room at once. What if I miss a dose? This does not apply. What may interact with this medicine? -chemotherapy or radiation therapy -medicines that lower your immune system like etanercept, anakinra, infliximab, and adalimumab -medicines that treat or prevent blood clots like warfarin -phenytoin -steroid medicines like prednisone or cortisone -theophylline -vaccines What should I watch for while using this medicine? Report any side effects that do not go away within 3 days to your doctor or health care professional. Call your health care provider if any unusual symptoms occur within 6 weeks of receiving this  vaccine. You may still catch the flu, but the illness is not usually as bad. You cannot get the flu from the vaccine. The vaccine will not protect against colds or other illnesses that may cause fever. The vaccine is needed every year. What side effects may I notice from receiving this medicine? Side effects that you should report to your doctor or health care professional as soon as possible: -allergic reactions like skin rash, itching or hives, swelling of the face, lips, or tongue Side effects that usually do not require medical attention (Report these to your doctor or health care professional if they continue or are bothersome.): -fever -headache -muscle aches and pains -pain, tenderness, redness, or swelling at the injection site -tiredness Where should I keep my medicine? The vaccine will be given by a health care professional in a clinic, pharmacy, doctor's office, or other health care setting. You will not be given vaccine doses to store at home.  2017 Elsevier/Gold Standard (2011-09-27 14:06:47)  Bacterial Vaginosis Bacterial vaginosis is a vaginal infection that occurs when the normal balance of bacteria in the vagina is disrupted. It results from an overgrowth of certain bacteria. This is the most common vaginal infection among women ages 56-44. Because bacterial vaginosis increases your risk for STIs (sexually transmitted infections), getting treated can help reduce your risk for chlamydia, gonorrhea, herpes, and HIV (human immunodeficiency virus). Treatment is also important for preventing complications in pregnant women, because this condition can cause an early (premature) delivery. What are the causes? This condition is caused by an increase in harmful bacteria that are normally present in small amounts in the vagina. However, the reason that the condition develops is not fully understood. What increases the risk? The following  factors may make you more likely to develop this  condition:  Having a new sexual partner or multiple sexual partners.  Having unprotected sex.  Douching.  Having an intrauterine device (IUD).  Smoking.  Drug and alcohol abuse.  Taking certain antibiotic medicines.  Being pregnant. You cannot get bacterial vaginosis from toilet seats, bedding, swimming pools, or contact with objects around you. What are the signs or symptoms? Symptoms of this condition include:  Grey or white vaginal discharge. The discharge can also be watery or foamy.  A fish-like odor with discharge, especially after sexual intercourse or during menstruation.  Itching in and around the vagina.  Burning or pain with urination. Some women with bacterial vaginosis have no signs or symptoms. How is this diagnosed? This condition is diagnosed based on:  Your medical history.  A physical exam of the vagina.  Testing a sample of vaginal fluid under a microscope to look for a large amount of bad bacteria or abnormal cells. Your health care provider may use a cotton swab or a small wooden spatula to collect the sample. How is this treated? This condition is treated with antibiotics. These may be given as a pill, a vaginal cream, or a medicine that is put into the vagina (suppository). If the condition comes back after treatment, a second round of antibiotics may be needed. Follow these instructions at home: Medicines  Take over-the-counter and prescription medicines only as told by your health care provider.  Take or use your antibiotic as told by your health care provider. Do not stop taking or using the antibiotic even if you start to feel better. General instructions  If you have a female sexual partner, tell her that you have a vaginal infection. She should see her health care provider and be treated if she has symptoms. If you have a female sexual partner, he does not need treatment.  During treatment:  Avoid sexual activity until you finish  treatment.  Do not douche.  Avoid alcohol as directed by your health care provider.  Avoid breastfeeding as directed by your health care provider.  Drink enough water and fluids to keep your urine clear or pale yellow.  Keep the area around your vagina and rectum clean.  Wash the area daily with warm water.  Wipe yourself from front to back after using the toilet.  Keep all follow-up visits as told by your health care provider. This is important. How is this prevented?  Do not douche.  Wash the outside of your vagina with warm water only.  Use protection when having sex. This includes latex condoms and dental dams.  Limit how many sexual partners you have. To help prevent bacterial vaginosis, it is best to have sex with just one partner (monogamous).  Make sure you and your sexual partner are tested for STIs.  Wear cotton or cotton-lined underwear.  Avoid wearing tight pants and pantyhose, especially during summer.  Limit the amount of alcohol that you drink.  Do not use any products that contain nicotine or tobacco, such as cigarettes and e-cigarettes. If you need help quitting, ask your health care provider.  Do not use illegal drugs. Where to find more information:  Centers for Disease Control and Prevention: AppraiserFraud.fi  American Sexual Health Association (ASHA): www.ashastd.org  U.S. Department of Health and Financial controller, Office on Women's Health: DustingSprays.pl or SecuritiesCard.it Contact a health care provider if:  Your symptoms do not improve, even after treatment.  You have more discharge or pain  when urinating.  You have a fever.  You have pain in your abdomen.  You have pain during sex.  You have vaginal bleeding between periods. Summary  Bacterial vaginosis is a vaginal infection that occurs when the normal balance of bacteria in the vagina is disrupted.  Because bacterial vaginosis  increases your risk for STIs (sexually transmitted infections), getting treated can help reduce your risk for chlamydia, gonorrhea, herpes, and HIV (human immunodeficiency virus). Treatment is also important for preventing complications in pregnant women, because the condition can cause an early (premature) delivery.  This condition is treated with antibiotic medicines. These may be given as a pill, a vaginal cream, or a medicine that is put into the vagina (suppository). This information is not intended to replace advice given to you by your health care provider. Make sure you discuss any questions you have with your health care provider. Document Released: 10/16/2005 Document Revised: 07/01/2016 Document Reviewed: 07/01/2016 Elsevier Interactive Patient Education  2017 Elsevier Inc.  Vaginal Yeast infection, Adult Vaginal yeast infection is a condition that causes soreness, swelling, and redness (inflammation) of the vagina. It also causes vaginal discharge. This is a common condition. Some women get this infection frequently. What are the causes? This condition is caused by a change in the normal balance of the yeast (candida) and bacteria that live in the vagina. This change causes an overgrowth of yeast, which causes the inflammation. What increases the risk? This condition is more likely to develop in:  Women who take antibiotic medicines.  Women who have diabetes.  Women who take birth control pills.  Women who are pregnant.  Women who douche often.  Women who have a weak defense (immune) system.  Women who have been taking steroid medicines for a long time.  Women who frequently wear tight clothing. What are the signs or symptoms? Symptoms of this condition include:  White, thick vaginal discharge.  Swelling, itching, redness, and irritation of the vagina. The lips of the vagina (vulva) may be affected as well.  Pain or a burning feeling while urinating.  Pain during  sex. How is this diagnosed? This condition is diagnosed with a medical history and physical exam. This will include a pelvic exam. Your health care provider will examine a sample of your vaginal discharge under a microscope. Your health care provider may send this sample for testing to confirm the diagnosis. How is this treated? This condition is treated with medicine. Medicines may be over-the-counter or prescription. You may be told to use one or more of the following:  Medicine that is taken orally.  Medicine that is applied as a cream.  Medicine that is inserted directly into the vagina (suppository). Follow these instructions at home:  Take or apply over-the-counter and prescription medicines only as told by your health care provider.  Do not have sex until your health care provider has approved. Tell your sex partner that you have a yeast infection. That person should go to his or her health care provider if he or she develops symptoms.  Do not wear tight clothes, such as pantyhose or tight pants.  Avoid using tampons until your health care provider approves.  Eat more yogurt. This may help to keep your yeast infection from returning.  Try taking a sitz bath to help with discomfort. This is a warm water bath that is taken while you are sitting down. The water should only come up to your hips and should cover your buttocks. Do this 3-4  times per day or as told by your health care provider.  Do not douche.  Wear breathable, cotton underwear.  If you have diabetes, keep your blood sugar levels under control. Contact a health care provider if:  You have a fever.  Your symptoms go away and then return.  Your symptoms do not get better with treatment.  Your symptoms get worse.  You have new symptoms.  You develop blisters in or around your vagina.  You have blood coming from your vagina and it is not your menstrual period.  You develop pain in your abdomen. This  information is not intended to replace advice given to you by your health care provider. Make sure you discuss any questions you have with your health care provider. Document Released: 07/26/2005 Document Revised: 03/29/2016 Document Reviewed: 04/19/2015 Elsevier Interactive Patient Education  2017 [employer].

## 2016-10-06 NOTE — Addendum Note (Signed)
Addended by: Thurnell Garbe A on: 10/06/2016 02:21 PM   Modules accepted: Orders

## 2016-10-06 NOTE — Progress Notes (Signed)
Brandy Maynard 1970/12/19 [id number]   History:    45 y.o.  for annual gyn exam with no complaints today. Review of patient's record indicated that patient had been referred to the [facility] oncology service because of patient's history of right lower quadrant pains on and off despite the fact that she had right salpingo-oophorectomy in the past but had a left ovarian cyst with elevated CA 125 and eventually her surgery was canceled and she is totally asymptomatic now. It has been 12 months since her last ultrasound. Patient does have a Mirena IUD and is having very light cycles if any.Patient with no previous history of any abnormal Pap smears. Patient many years ago had right salpingo-oophorectomy for benign ovarian cyst and lysis of pelvic adhesions laparoscopically. Patient had a colonoscopy this year benign polyps were removed she is on a 5 year recall.  Past medical history,surgical history, family history and social history were all reviewed and documented in the EPIC chart.  Gynecologic History No LMP recorded. Patient is not currently having periods (Reason: IUD). Contraception: IUD Last Pap: 2015. Results were: normal Last mammogram: 2017. Results were: normal  Obstetric History OB History  Gravida Para Term Preterm AB Living  3 2 2   1 2   SAB TAB Ectopic Multiple Live Births  1       2    # Outcome Date GA Lbr Len/2nd Weight Sex Delivery Anes PTL Lv  3 SAB           2 Term     F Vag-Spont  N LIV  1 Term     F Vag-Spont  N LIV       ROS: A ROS was performed and pertinent positives and negatives are included in the history.  GENERAL: No fevers or chills. HEENT: No change in vision, no earache, sore throat or sinus congestion. NECK: No pain or stiffness. CARDIOVASCULAR: No chest pain or pressure. No palpitations. PULMONARY: No shortness of breath, cough or wheeze. GASTROINTESTINAL: No abdominal pain, nausea, vomiting or diarrhea, melena or bright red blood per rectum.  GENITOURINARY: No urinary frequency, urgency, hesitancy or dysuria. MUSCULOSKELETAL: No joint or muscle pain, no back pain, no recent trauma. DERMATOLOGIC: No rash, no itching, no lesions. ENDOCRINE: No polyuria, polydipsia, no heat or cold intolerance. No recent change in weight. HEMATOLOGICAL: No anemia or easy bruising or bleeding. NEUROLOGIC: No headache, seizures, numbness, tingling or weakness. PSYCHIATRIC: No depression, no loss of interest in normal activity or change in sleep pattern.     Exam: chaperone present  BP 128/76   Ht 5\' 5"  (1.651 m)   Wt 213 lb (96.6 kg)   BMI 35.45 kg/m   Body mass index is 35.45 kg/m.  General appearance : Well developed well nourished female. No acute distress HEENT: Eyes: no retinal hemorrhage or exudates,  Neck supple, trachea midline, no carotid bruits, no thyroidmegaly Lungs: Clear to auscultation, no rhonchi or wheezes, or rib retractions  Heart: Regular rate and rhythm, no murmurs or gallops Breast:Examined in sitting and supine position were symmetrical in appearance, no palpable masses or tenderness,  no skin retraction, no nipple inversion, no nipple discharge, no skin discoloration, no axillary or supraclavicular lymphadenopathy Abdomen: no palpable masses or tenderness, no rebound or guarding Extremities: no edema or skin discoloration or tenderness  Pelvic:  Bartholin, Urethra, Skene Glands: Within normal limits             Vagina: Thick white discharge noted  Cervix:  No gross lesions or discharge  Uterus  anteverted, normal size, shape and consistency, non-tender and mobile  Adnexa  Without masses or tenderness  Anus and perineum  normal   Rectovaginal  normal sphincter tone without palpated masses or tenderness             Hemoccult not indicated   Wet prep: Moderate yeast, few clue cells, many white blood cells and many bacteria  Assessment/Plan:  45 y.o. female for annual exam will be treated for bacterial vaginosis with  Tindamax 500 mg tablets. She will take 4 tablets today repeat in 24 hours. Also prescription for Diflucan 150 mg one by mouth to take for her yeast vaginitis. Patient will return back in a few weeks for a follow-up ultrasound due to her past history of left ovarian cysts and the IUD string not seen today. Pap smear was not indicated this year.  An additional 10 minutes was spent discussing incidental findings of yeast and bacterial vaginosis and different treatment options. As well as growing over past records from ovarian cyst history and recommended follow-up.   Terrance Mass MD, 12:10 PM 10/06/2016

## 2016-10-12 ENCOUNTER — Encounter: Payer: Self-pay | Admitting: Gynecology

## 2016-10-12 ENCOUNTER — Ambulatory Visit (INDEPENDENT_AMBULATORY_CARE_PROVIDER_SITE_OTHER): Payer: BLUE CROSS/BLUE SHIELD

## 2016-10-12 ENCOUNTER — Ambulatory Visit (INDEPENDENT_AMBULATORY_CARE_PROVIDER_SITE_OTHER): Payer: BLUE CROSS/BLUE SHIELD | Admitting: Gynecology

## 2016-10-12 ENCOUNTER — Other Ambulatory Visit: Payer: Self-pay | Admitting: Gynecology

## 2016-10-12 DIAGNOSIS — Z8742 Personal history of other diseases of the female genital tract: Secondary | ICD-10-CM

## 2016-10-12 DIAGNOSIS — R102 Pelvic and perineal pain: Secondary | ICD-10-CM

## 2016-10-12 DIAGNOSIS — R609 Edema, unspecified: Secondary | ICD-10-CM | POA: Insufficient documentation

## 2016-10-12 DIAGNOSIS — N940 Mittelschmerz: Secondary | ICD-10-CM | POA: Diagnosis not present

## 2016-10-12 MED ORDER — IBUPROFEN 800 MG PO TABS: 800.0000 mg | ORAL_TABLET | Freq: Three times a day (TID) | ORAL | 3 refills | Status: DC | PRN

## 2016-10-12 MED ORDER — SPIRONOLACTONE 25 MG PO TABS: 25.0000 mg | ORAL_TABLET | Freq: Every day | ORAL | 5 refills | Status: DC

## 2016-10-12 NOTE — Progress Notes (Signed)
   Patient is a 45 year old who presented to the office today for follow-up on left ovarian cystic mass and abdominal cramping. She was seen for her annual exam on 10/06/2016 see previous note for details.Review of patient's record indicated that patient had been referred to the [place] [facility] oncology service because of patient's history of right lower quadrant pains on and off despite the fact that she had right salpingo-oophorectomy in the past but had a left ovarian cyst with elevated CA 125 and eventually her surgery was canceled and she is totally asymptomatic now. It has been 12 months since her last ultrasound. Patient does have a Mirena IUD and is having very light cycles if any.Patient with no previous history of any abnormal Pap smears. Patient many years ago had right salpingo-oophorectomy for benign ovarian cyst and lysis of pelvic adhesions laparoscopically.  Ultrasound today: Uterus measured 9.4 x 6.2 x 4.5 cm with endometrial stripe at 2.4 mm. Right adnexa negative. Left ovarian follicle 20 mm, 27 x 24 mm. Previous left adnexal cystic mass not seen. Negative fluid in the cul-de-sac. No masses in either adnexa.  Patient states that every 2-3 months she retain some fluid and has some cramping during the time that she would be ovulating. She wanted a prescription for Motrin 800 mg to take 3 times a day when necessary and also a prescription refill for Aldactone 25 mg which she takes one by mouth daily when necessary. This was provided.  Greater than 90% time was spent counseling [facility] care for this patient time of consultation 10 minutes.

## 2016-10-20 DIAGNOSIS — M542 Cervicalgia: Secondary | ICD-10-CM | POA: Diagnosis not present

## 2016-11-20 ENCOUNTER — Ambulatory Visit (INDEPENDENT_AMBULATORY_CARE_PROVIDER_SITE_OTHER): Payer: BLUE CROSS/BLUE SHIELD | Admitting: Family Medicine

## 2016-11-20 ENCOUNTER — Encounter: Payer: Self-pay | Admitting: Family Medicine

## 2016-11-20 VITALS — BP 108/80 | HR 96 | Temp 98.2°F | Ht 65.0 in | Wt 216.2 lb

## 2016-11-20 DIAGNOSIS — J111 Influenza due to unidentified influenza virus with other respiratory manifestations: Secondary | ICD-10-CM

## 2016-11-20 DIAGNOSIS — H6690 Otitis media, unspecified, unspecified ear: Secondary | ICD-10-CM | POA: Diagnosis not present

## 2016-11-20 MED ORDER — BENZONATATE 100 MG PO CAPS: 100.0000 mg | ORAL_CAPSULE | Freq: Two times a day (BID) | ORAL | 0 refills | Status: DC | PRN

## 2016-11-20 MED ORDER — AZITHROMYCIN 250 MG PO TABS: ORAL_TABLET | ORAL | 0 refills | Status: DC

## 2016-11-20 NOTE — Progress Notes (Signed)
HPI:  URI: -started: 2-3 days ago -symptoms:nasal congestion, sore throat, cough, body aches, fevers initially up to 102, L ear pressure and pain -denies:fever, SOB, NVD, tooth pain -has tried: nyquil - helps -sick contacts/travel/risks: no reported flu, strep or tick exposure -Hx of: allergies ROS: See pertinent positives and negatives per HPI.  Past Medical History:  Diagnosis Date  . Allergy   . Anemia   . ANEMIA 12/15/2009   Qualifier: Diagnosis of  By: Regis Bill MD, Standley Brooking   . Depression   . GERD (gastroesophageal reflux disease)   . Heavy menses    on ocps  . IBS (irritable bowel syndrome)    by hx and had a neg colonscopy  . Migraines     Past Surgical History:  Procedure Laterality Date  . CHOLECYSTECTOMY    . COLONOSCOPY  08/03/2005  . COLONOSCOPY W/ BIOPSIES  Nov 12 2015  . ESOPHAGOGASTRODUODENOSCOPY ENDOSCOPY  Nov 12 2015  . FOOT SURGERY    . KNEE ARTHROSCOPY  2012   right knee,,, left knee at 15  . LIGAMENT REPAIR  2002   Rt ankle  . PELVIC LAPAROSCOPY  10/11/2005   LAPAROTOMY, LYSIS OF PELVIC ADHESIONS, RSO AND CAUTERIZATION AND TRANSECTION OF LEFT FALLOPIAN TUBE  . PLICA, arthritis and cartilage repair  01/11/2011   on Rt knee  . TUBAL LIGATION  09/2005   scar tissue removal ovarian cyst    Family History  Problem Relation Age of Onset  . Arthritis Mother   . Hypertension Mother   . Cancer Mother     MELANOMA  . Hyperlipidemia Mother   . Hypertension Maternal Grandmother   . Dementia Maternal Grandmother   . Hypertension Maternal Grandfather   . Cancer Maternal Grandfather     MELANOMA  . Heart disease Paternal Grandmother   . Diabetes Paternal Grandfather   . Heart disease Paternal Grandfather   . Heart disease Father   . Cancer - Ovarian Other     Mat Great Grandmother    Social History   Social History  . Marital status: Married    Spouse name: N/A  . Number of children: N/A  . Years of education: N/A   Social History Main  Topics  . Smoking status: Never Smoker  . Smokeless tobacco: Never Used  . Alcohol use 0.0 oz/week  . Drug use: No  . Sexual activity: Yes   Other Topics Concern  . None   Social History Narrative   Occupation: unemployed   Married   [place] of 3-4    Pet dogs 2   [id number]              Current Outpatient Prescriptions:  .  ALPRAZolam (XANAX) 0.25 MG tablet, Take 1 tablet (0.25 mg total) by mouth at bedtime as needed for anxiety., Disp: 30 tablet, Rfl: 0 .  cetirizine (ZYRTEC) 10 MG tablet, Take 10 mg by mouth daily. Reported on 12/03/2015, Disp: , Rfl:  .  cyclobenzaprine (FLEXERIL) 10 MG tablet, TK 1 T PO Q 8 H PRF MUSCLE SPASM, Disp: , Rfl: 0 .  ibuprofen (ADVIL,MOTRIN) 800 MG tablet, Take 1 tablet (800 mg total) by mouth every 8 (eight) hours as needed., Disp: 30 tablet, Rfl: 3 .  levonorgestrel (MIRENA) 20 MCG/24HR IUD, 1 Intra Uterine Device (1 each total) by Intrauterine route once., Disp: 1 each, Rfl: 0 .  omeprazole (PRILOSEC OTC) 20 MG tablet, Take 20 mg by mouth daily. , Disp: , Rfl:  .  spironolactone (  ALDACTONE) 25 MG tablet, Take 1 tablet (25 mg total) by mouth daily., Disp: 30 tablet, Rfl: 5 .  azithromycin (ZITHROMAX) 250 MG tablet, 2 tabs day 1, then one tab daily, Disp: 6 tablet, Rfl: 0 .  benzonatate (TESSALON) 100 MG capsule, Take 1 capsule (100 mg total) by mouth 2 (two) times daily as needed for cough., Disp: 20 capsule, Rfl: 0  EXAM:  Vitals:   11/20/16 1016  BP: 108/80  Pulse: 96  Temp: 98.2 F (36.8 C)    Body mass index is 35.98 kg/m.  GENERAL: vitals reviewed and listed above, alert, oriented, appears well hydrated and in no acute distress  HEENT: atraumatic, conjunttiva clear, no obvious abnormalities on inspection of external nose and ears, normal appearance of ear canals and TMs except for mildly discolored effusion L with mildly dull and bulging L TM, clear nasal congestion, mild post oropharyngeal erythema with PND, no tonsillar edema or exudate, no  sinus TTP  NECK: no obvious masses on inspection  LUNGS: clear to auscultation bilaterally, no wheezes, rales or rhonchi, good air movement  CV: HRRR, no peripheral edema  MS: moves all extremities without noticeable abnormality  PSYCH: pleasant and cooperative, no obvious depression or anxiety  ASSESSMENT AND PLAN:  Discussed the following assessment and plan:  Influenza  Acute otitis media, unspecified otitis media type  .We discussed potential etiologies, with influenza an potentially developing L OM being most likely. We discussed flu symptoms, potential complications,  testing, tamiflu risks/indications/limitation, treatment side effects, likely course, antibiotic misuse, transmission, and signs of developing a serious illness.She opted against testing and against tamiflu. Opted for cough medication (tessalon) and abx (azithromycin) if ear discomfort worsens of persists over the next few days. -of course, we advised to return or notify a doctor immediately if symptoms worsen or persist or new concerns arise.    Patient Instructions  Influenza, Adult Influenza, more commonly known as "the flu," is a viral infection that primarily affects the respiratory tract. The respiratory tract includes organs that help you breathe, such as the lungs, nose, and throat. The flu causes many common cold symptoms, as well as a high fever and body aches. The flu spreads easily from person to person (is contagious). Getting a flu shot (influenza vaccination) every year is the best way to prevent influenza. What are the causes? Influenza is caused by a virus. You can catch the virus by:  Breathing in droplets from an infected person's cough or sneeze.  Touching something that was recently contaminated with the virus and then touching your mouth, nose, or eyes. What increases the risk? The following factors may make you more likely to get the flu:  Not cleaning your hands frequently with soap  and water or alcohol-based hand sanitizer.  Having close contact with many people during cold and flu season.  Touching your mouth, eyes, or nose without washing or sanitizing your hands first.  Not drinking enough fluids or not eating a healthy diet.  Not getting enough sleep or exercise.  Being under a high amount of stress.  Not getting a yearly (annual) flu shot. You may be at a higher risk of complications from the flu, such as a severe lung infection (pneumonia), if you:  Are over the age of 26.  Are pregnant.  Have a weakened disease-fighting system (immune system). You may have a weakened immune system if you:  Have HIV or AIDS.  Are undergoing chemotherapy.  Aretaking medicines that reduce the activity of (suppress)  the immune system.  Have a long-term (chronic) illness, such as heart disease, kidney disease, diabetes, or lung disease.  Have a liver disorder.  Are obese.  Have anemia. What are the signs or symptoms? Symptoms of this condition typically last 4-10 days and may include:  Fever.  Chills.  Headache, body aches, or muscle aches.  Sore throat.  Cough.  Runny or congested nose.  Chest discomfort and cough.  Poor appetite.  Weakness or tiredness (fatigue).  Dizziness.  Nausea or vomiting. How is this diagnosed? This condition may be diagnosed based on your medical history and a physical exam. Your health care provider may do a nose or throat swab test to confirm the diagnosis. How is this treated? If influenza is detected early, you can be treated with antiviral medicine that can reduce the length of your illness and the severity of your symptoms. This medicine may be given by mouth (orally) or through an IV tube that is inserted in one of your veins. The goal of treatment is to relieve symptoms by taking care of yourself at home. This may include taking over-the-counter medicines, drinking plenty of fluids, and adding humidity to the  air in your home. In some cases, influenza goes away on its own. Severe influenza or complications from influenza may be treated in a hospital. Follow these instructions at home:  Take over-the-counter and prescription medicines only as told by your health care provider.  Use a cool mist humidifier to add humidity to the air in your home. This can make breathing easier.  Rest as needed.  Drink enough fluid to keep your urine clear or pale yellow.  Cover your mouth and nose when you cough or sneeze.  Wash your hands with soap and water often, especially after you cough or sneeze. If soap and water are not available, use hand sanitizer.  Stay home from work or school as told by your health care provider. Unless you are visiting your health care provider, try to avoid leaving home until your fever has been gone for 24 hours without the use of medicine.  Keep all follow-up visits as told by your health care provider. This is important. How is this prevented?  Getting an annual flu shot is the best way to avoid getting the flu. You may get the flu shot in late summer, fall, or winter. Ask your health care provider when you should get your flu shot.  Wash your hands often or use hand sanitizer often.  Avoid contact with people who are sick during cold and flu season.  Eat a healthy diet, drink plenty of fluids, get enough sleep, and exercise regularly. Contact a health care provider if:  You develop new symptoms.  You have:  Chest pain.  Diarrhea.  A fever.  Your cough gets worse.  You produce more mucus.  You feel nauseous or you vomit. Get help right away if:  You develop shortness of breath or difficulty breathing.  Your skin or nails turn a bluish color.  You have severe pain or stiffness in your neck.  You develop a sudden headache or sudden pain in your face or ear.  You cannot stop vomiting. This information is not intended to replace advice given to you by  your health care provider. Make sure you discuss any questions you have with your health care provider. Document Released: 10/13/2000 Document Revised: 03/23/2016 Document Reviewed: 08/10/2015 Elsevier Interactive Patient Education  [address].    [place], Jarrett Soho R.,  DO

## 2016-11-20 NOTE — Patient Instructions (Signed)

## 2016-11-20 NOTE — Progress Notes (Signed)
Pre visit review using our clinic review tool, if applicable. No additional management support is needed unless otherwise documented below in the visit note. 

## 2017-03-09 DIAGNOSIS — M25562 Pain in left knee: Secondary | ICD-10-CM | POA: Diagnosis not present

## 2017-03-14 ENCOUNTER — Encounter: Payer: Self-pay | Admitting: Gynecology

## 2017-03-23 DIAGNOSIS — M1712 Unilateral primary osteoarthritis, left knee: Secondary | ICD-10-CM | POA: Diagnosis not present

## 2017-03-23 DIAGNOSIS — M25562 Pain in left knee: Secondary | ICD-10-CM | POA: Diagnosis not present

## 2017-04-13 ENCOUNTER — Ambulatory Visit (INDEPENDENT_AMBULATORY_CARE_PROVIDER_SITE_OTHER): Payer: BLUE CROSS/BLUE SHIELD | Admitting: Family Medicine

## 2017-04-13 ENCOUNTER — Encounter: Payer: Self-pay | Admitting: Family Medicine

## 2017-04-13 VITALS — BP 108/80 | HR 88 | Temp 98.2°F | Ht 65.0 in | Wt 211.2 lb

## 2017-04-13 DIAGNOSIS — S81812A Laceration without foreign body, left lower leg, initial encounter: Secondary | ICD-10-CM

## 2017-04-13 MED ORDER — DOXYCYCLINE HYCLATE 100 MG PO TABS: 100.0000 mg | ORAL_TABLET | Freq: Two times a day (BID) | ORAL | 0 refills | Status: DC

## 2017-04-13 NOTE — Progress Notes (Signed)
Subjective:  Brandy Maynard is a 46 y.o. year old very pleasant female patient who presents for/with See problem oriented charting ROS-no fevers, chills, fatigue/malaise, nausea/vomiting. No expanding redness   Past Medical History-  Patient Active Problem List   Diagnosis Date Noted  . [place] 10/12/2016  . Fluid retention 10/12/2016  . History of ovarian cyst 10/06/2016  . Elevated CA-125 11/19/2015  . IUD (intrauterine device) in place 01/29/2015  . BMI 35.0-35.9,adult 06/04/2014  . Hyperglycemia 12/05/2013  . Elevated blood pressure 12/05/2013  . Muscle spasm of back 06/24/2013  . Preventative health care 05/02/2012  . Elevated BP 05/02/2012  . Edema of extremities 02/01/2012  . Anxiety 02/01/2012  . PELVIC PAIN, LEFT 03/17/2010  . OBESITY 12/15/2009  . DEPRESSION 12/15/2009  . ALLERGIC RHINITIS 12/15/2009  . GERD 12/15/2009  . HEADACHE 12/15/2009  . HYPERGLYCEMIA 12/15/2009    Medications- reviewed and updated Current Outpatient Prescriptions  Medication Sig Dispense Refill  . cetirizine (ZYRTEC) 10 MG tablet Take 10 mg by mouth daily. Reported on 12/03/2015    . levonorgestrel (MIRENA) 20 MCG/24HR IUD 1 Intra Uterine Device (1 each total) by Intrauterine route once. 1 each 0  . omeprazole (PRILOSEC OTC) 20 MG tablet Take 20 mg by mouth daily.     Marland Kitchen doxycycline (VIBRA-TABS) 100 MG tablet Take 1 tablet (100 mg total) by mouth 2 (two) times daily. 14 tablet 0   No current facility-administered medications for this visit.     Objective: BP 108/80 (BP Location: Left Arm, Patient Position: Sitting, Cuff Size: Large)   Pulse 88   Temp 98.2 F (36.8 C) (Oral)   Ht 5\' 5"  (1.651 m)   Wt 211 lb 3.2 oz (95.8 kg)   SpO2 96%   BMI 35.15 kg/m  Gen: NAD, resting comfortably, well appearing CV: RRR no murmurs rubs or gallops Lungs: CTAB no crackles, wheeze, rhonchi Ext: no edema Skin: warm, dry Above lateral malleolus on left ankle patient has approximately a 2 cm  healing laceration. There is mild surrounding erythema with minimal warmth. She does have some pain in this area.      Assessment/Plan:  Laceration of left lower leg, initial encounter S: Patient sustained a laceration on left lower lateral leg above lateral mediolus on June the 2nd. She sustained this while shaving. Tdap was 2013. She remained at beach for next 9 days and area never seemed to improve though she was frequently in the pool or ocean. Apparently that beach got closed due to high bacteria shortly thereafter. She became concerned when she realized she was 13 days in and the area was not improving very much- though she shows me a picture and wound appeared wider 6 days ago with some slough instead of scabbed over as pictured above. She has some mild redness around the wound. Apparently it was rather deep at first and took some prolonged pressure to stop the bleeding.  A/P: based on her picture from 6 days ago this appears to be slowly healing. Appears to have been a rather deep cut and we discussed likely will take another 2-3 weeks. Has mild surrounding pain but not worsening and ok to monitor only. She is anxious going into the weekend so I agreed to write her a script for doxycycline if she has new or worsening symptoms (penicillin allergic)  Meds ordered this encounter  Medications  . doxycycline (VIBRA-TABS) 100 MG tablet    Sig: Take 1 tablet (100 mg total) by mouth 2 (two) times  daily.    Dispense:  14 tablet    Refill:  0    Return precautions advised. PRN Garret Reddish, MD

## 2017-04-13 NOTE — Patient Instructions (Signed)
This is a tough area to heal. Based off your prior picture I think it is just slowly healing at this time. May take another 2-3 weeks because it appeared to be rather deep.   Given continued pain and surrounding redness, wrote you a script for antibiotic doxycycline which you can take if you have worsening symptoms such as worsening pain, expanding redness, or you developed fever

## 2017-05-05 IMAGING — CT CT RENAL STONE PROTOCOL
2 of 4 series · 17 of 46 positions shown, 19 images · non-contrast
Comparison: None.

CLINICAL DATA: Frequent urination and right flank pain

EXAM:
CT ABDOMEN AND PELVIS WITHOUT CONTRAST
TECHNIQUE: Multidetector CT imaging of the abdomen and pelvis was performed
following the standard protocol without IV contrast.

[Series 2: renal stone > 200 lbs 5.0 b31f · axial · 0.72mm/px · z∈[-658,-233]mm · 14 of 93 slices shown, 16 images]
[im 4/93  soft-tissue]
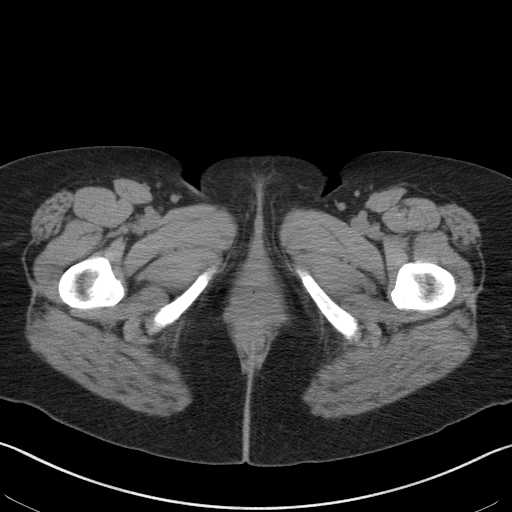
[im 4/93  bone]
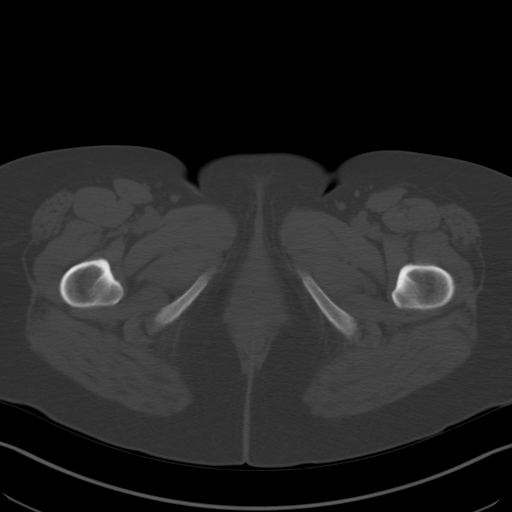
[im 12/93  soft-tissue]
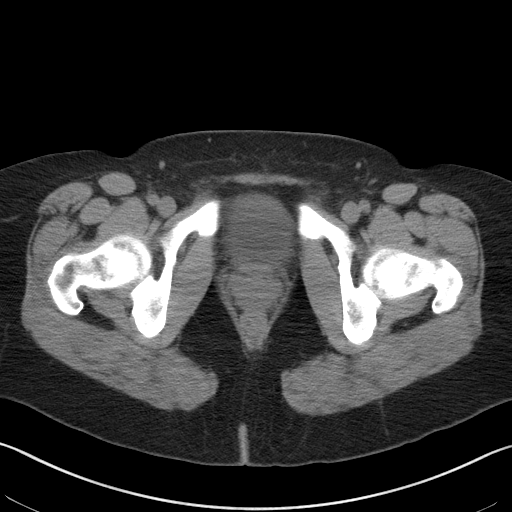
[im 20/93  soft-tissue]
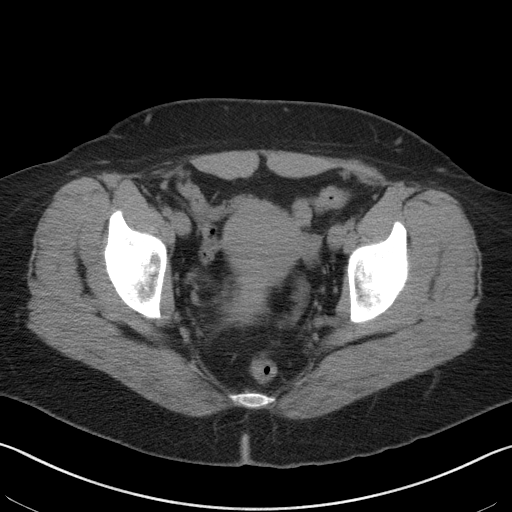
[im 24/93  soft-tissue]
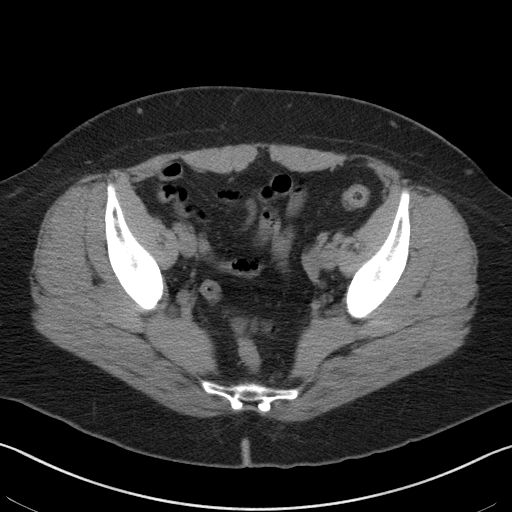
[im 31/93  soft-tissue]
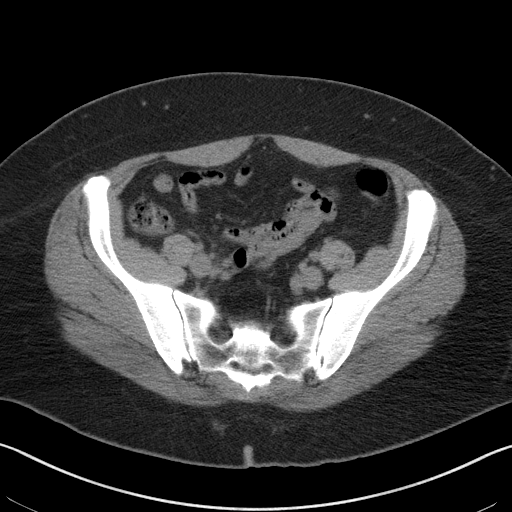
[im 39/93  soft-tissue]
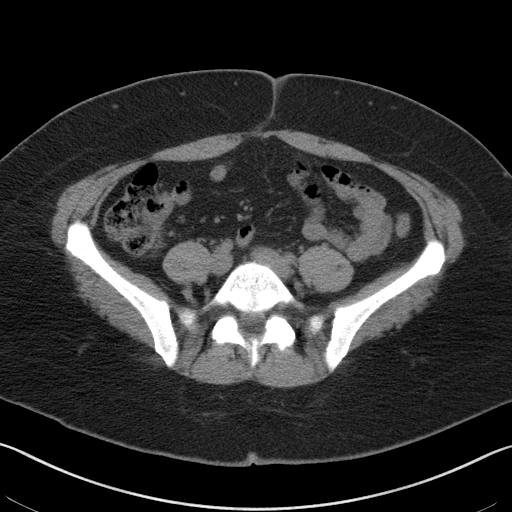
[im 43/93  soft-tissue]
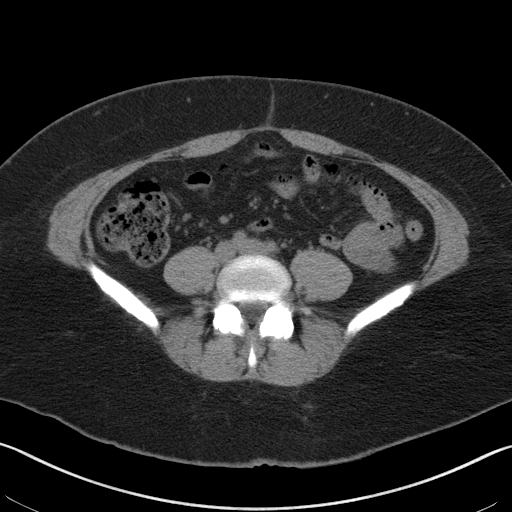
[im 50/93  soft-tissue]
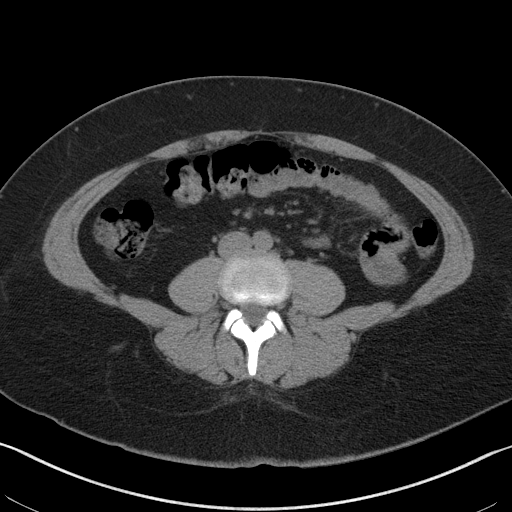
[im 54/93  soft-tissue]
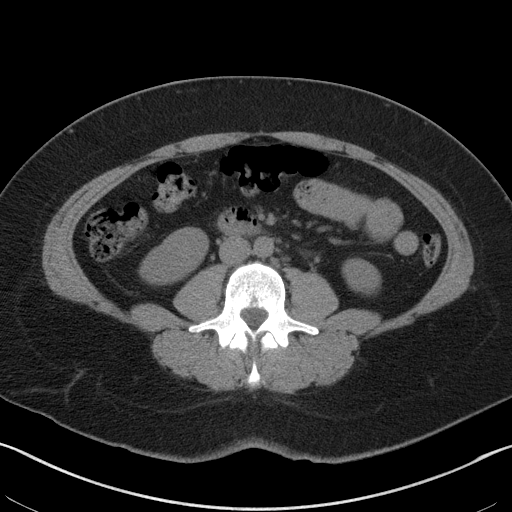
[im 54/93  bone]
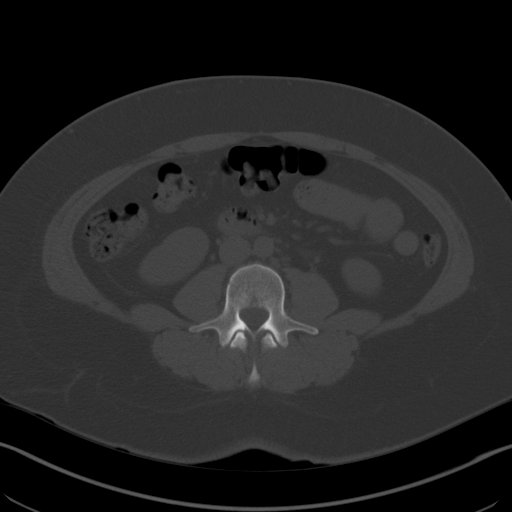
[im 62/93  soft-tissue]
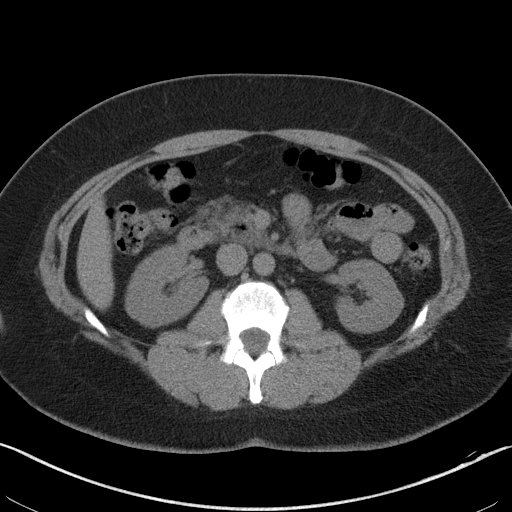
[im 70/93  soft-tissue]
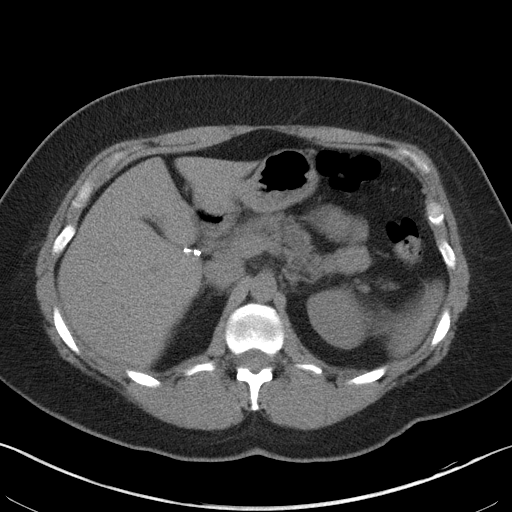
[im 73/93  soft-tissue]
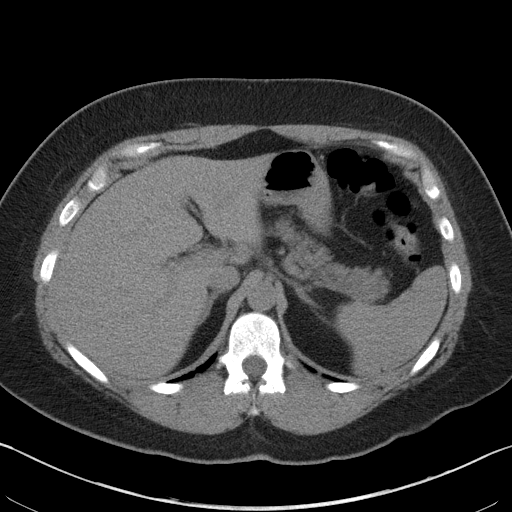
[im 81/93  soft-tissue]
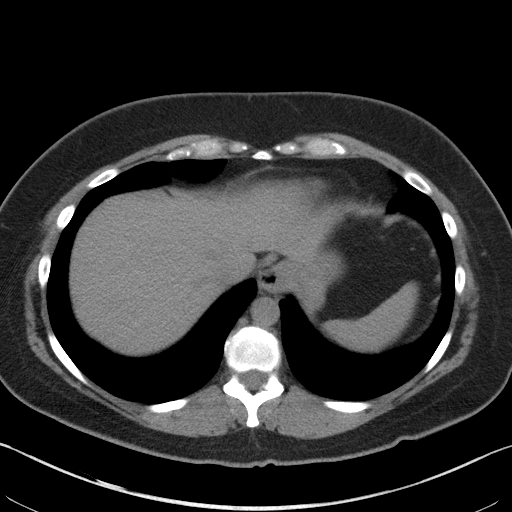
[im 89/93  soft-tissue]
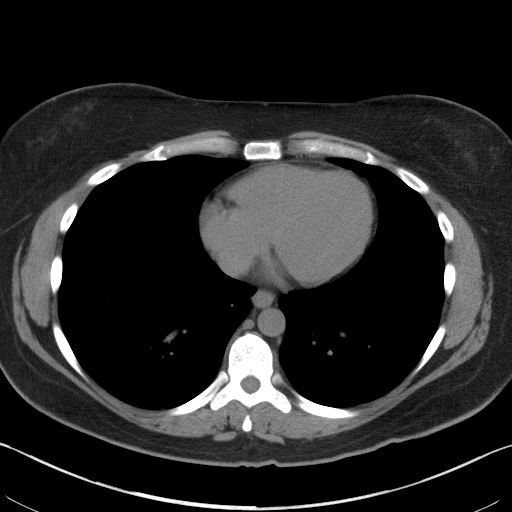

[Series 5: renal stone 3.0 coronal · coronal · 0.87mm/px · 3 of 91 slices shown]
[im 31/91  soft-tissue]
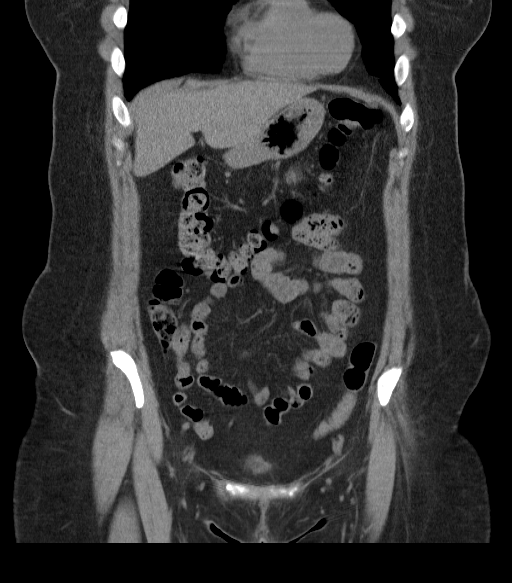
[im 41/91  soft-tissue]
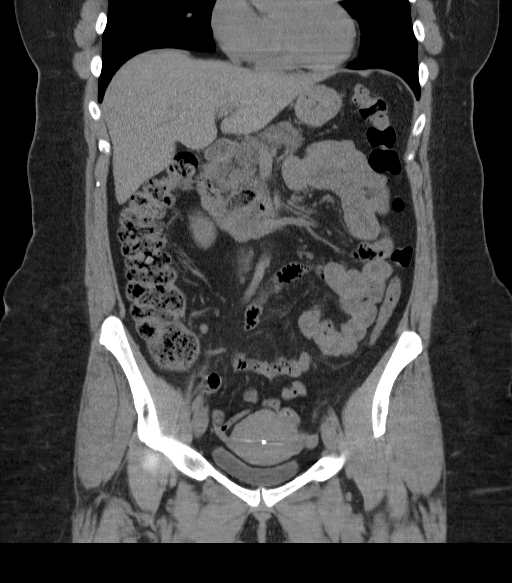
[im 51/91  soft-tissue]
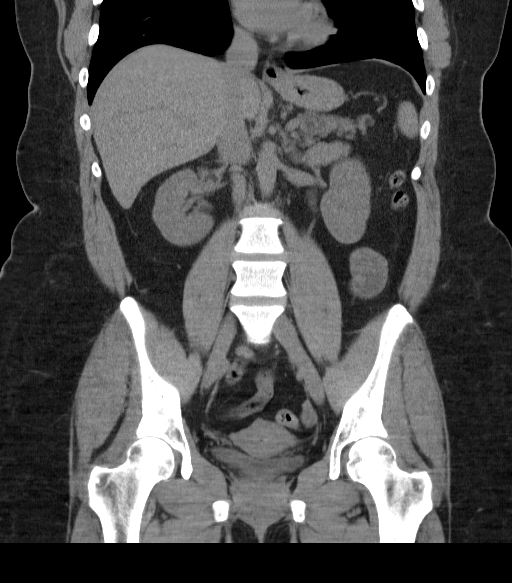

[17 of 46 positions shown; findings below may reference images not displayed]

FINDINGS: BODY WALL: No contributory findings.

LOWER CHEST: No contributory findings.

ABDOMEN/PELVIS:

Liver: No focal abnormality.

Biliary: Cholecystectomy.

Pancreas: Unremarkable.

Spleen: Unremarkable.

Adrenals: Unremarkable.

Kidneys and ureters: No hydronephrosis or stone.

Bladder: Unremarkable.

Reproductive: No pathologic findings. IUD which is in good position.

Bowel: No obstruction. No appendicitis. Uncomplicated distal
duodenal diverticulum.

Retroperitoneum: No mass or adenopathy.

Peritoneum: No ascites or pneumoperitoneum.

Vascular: No acute abnormality.

OSSEOUS: No acute abnormalities.
IMPRESSION: No explanation for abdominal pain.

## 2017-05-19 DIAGNOSIS — R05 Cough: Secondary | ICD-10-CM | POA: Diagnosis not present

## 2017-05-19 DIAGNOSIS — J019 Acute sinusitis, unspecified: Secondary | ICD-10-CM | POA: Diagnosis not present

## 2017-05-19 DIAGNOSIS — J00 Acute nasopharyngitis [common cold]: Secondary | ICD-10-CM | POA: Diagnosis not present

## 2017-05-19 DIAGNOSIS — J029 Acute pharyngitis, unspecified: Secondary | ICD-10-CM | POA: Diagnosis not present

## 2017-05-22 ENCOUNTER — Encounter: Payer: Self-pay | Admitting: Internal Medicine

## 2017-06-01 ENCOUNTER — Encounter: Payer: Self-pay | Admitting: Gynecology

## 2017-06-01 ENCOUNTER — Ambulatory Visit (INDEPENDENT_AMBULATORY_CARE_PROVIDER_SITE_OTHER): Payer: BLUE CROSS/BLUE SHIELD | Admitting: Gynecology

## 2017-06-01 VITALS — BP 122/78 | Wt 207.4 lb

## 2017-06-01 DIAGNOSIS — B9689 Other specified bacterial agents as the cause of diseases classified elsewhere: Secondary | ICD-10-CM | POA: Diagnosis not present

## 2017-06-01 DIAGNOSIS — B3731 Acute candidiasis of vulva and vagina: Secondary | ICD-10-CM

## 2017-06-01 DIAGNOSIS — L292 Pruritus vulvae: Secondary | ICD-10-CM

## 2017-06-01 DIAGNOSIS — B373 Candidiasis of vulva and vagina: Secondary | ICD-10-CM

## 2017-06-01 DIAGNOSIS — N898 Other specified noninflammatory disorders of vagina: Secondary | ICD-10-CM | POA: Diagnosis not present

## 2017-06-01 DIAGNOSIS — N760 Acute vaginitis: Secondary | ICD-10-CM

## 2017-06-01 LAB — WET PREP FOR TRICH, YEAST, CLUE: Trich, Wet Prep: NONE SEEN

## 2017-06-01 MED ORDER — FLUCONAZOLE 150 MG PO TABS: ORAL_TABLET | ORAL | 1 refills | Status: DC

## 2017-06-01 MED ORDER — METRONIDAZOLE 500 MG PO TABS: 500.0000 mg | ORAL_TABLET | Freq: Two times a day (BID) | ORAL | 0 refills | Status: DC

## 2017-06-01 MED ORDER — TINIDAZOLE 500 MG PO TABS: ORAL_TABLET | ORAL | 0 refills | Status: DC

## 2017-06-01 NOTE — Progress Notes (Signed)
   Patient is a 46 year old that presented to the office today complaining of vulvar irritation and vaginal odor. Patient is in a monogamous relationship. She is using an IUD for contraception. She denies any dysuria or frequency or any back pain or fever chills or nausea or vomiting.  Exam: Gen. appearance well-developed well-nourished female with the above-mentioned complaint Back: No CVA tenderness Abdomen soft nontender no rebound or guarding Pelvic: Bartholin urethra Skene was within normal limits Vagina creamy foul odor discharge Cervix no gross lesions on inspection Bimanual exam not done Rectal exam: Not done  Wet prep moderate hyphae/yeast, too numerous to count clue cells, too numerous to count white blood cell, too numerous to count bacteria  Assessment/plan: Patient with clinical evidence of bacterial vaginitis and bacterial vaginosis. She'll be treated with Diflucan 1 tablet by mouth today and start Flagyl 500 mg twice a day for 7 days.

## 2017-06-01 NOTE — Patient Instructions (Addendum)
Bacterial Vaginosis Bacterial vaginosis is a vaginal infection that occurs when the normal balance of bacteria in the vagina is disrupted. It results from an overgrowth of certain bacteria. This is the most common vaginal infection among women ages 15-44. Because bacterial vaginosis increases your risk for STIs (sexually transmitted infections), getting treated can help reduce your risk for chlamydia, gonorrhea, herpes, and HIV (human immunodeficiency virus). Treatment is also important for preventing complications in pregnant women, because this condition can cause an early (premature) delivery. What are the causes? This condition is caused by an increase in harmful bacteria that are normally present in small amounts in the vagina. However, the reason that the condition develops is not fully understood. What increases the risk? The following factors may make you more likely to develop this condition:  Having a new sexual partner or multiple sexual partners.  Having unprotected sex.  Douching.  Having an intrauterine device (IUD).  Smoking.  Drug and alcohol abuse.  Taking certain antibiotic medicines.  Being pregnant.  You cannot get bacterial vaginosis from toilet seats, bedding, swimming pools, or contact with objects around you. What are the signs or symptoms? Symptoms of this condition include:  Grey or white vaginal discharge. The discharge can also be watery or foamy.  A fish-like odor with discharge, especially after sexual intercourse or during menstruation.  Itching in and around the vagina.  Burning or pain with urination.  Some women with bacterial vaginosis have no signs or symptoms. How is this diagnosed? This condition is diagnosed based on:  Your medical history.  A physical exam of the vagina.  Testing a sample of vaginal fluid under a microscope to look for a large amount of bad bacteria or abnormal cells. Your health care provider may use a cotton swab  or a small wooden spatula to collect the sample.  How is this treated? This condition is treated with antibiotics. These may be given as a pill, a vaginal cream, or a medicine that is put into the vagina (suppository). If the condition comes back after treatment, a second round of antibiotics may be needed. Follow these instructions at home: Medicines  Take over-the-counter and prescription medicines only as told by your health care provider.  Take or use your antibiotic as told by your health care provider. Do not stop taking or using the antibiotic even if you start to feel better. General instructions  If you have a female sexual partner, tell her that you have a vaginal infection. She should see her health care provider and be treated if she has symptoms. If you have a female sexual partner, he does not need treatment.  During treatment: ? Avoid sexual activity until you finish treatment. ? Do not douche. ? Avoid alcohol as directed by your health care provider. ? Avoid breastfeeding as directed by your health care provider.  Drink enough water and fluids to keep your urine clear or pale yellow.  Keep the area around your vagina and rectum clean. ? Wash the area daily with warm water. ? Wipe yourself from front to back after using the toilet.  Keep all follow-up visits as told by your health care provider. This is important. How is this prevented?  Do not douche.  Wash the outside of your vagina with warm water only.  Use protection when having sex. This includes latex condoms and dental dams.  Limit how many sexual partners you have. To help prevent bacterial vaginosis, it is best to have sex with just   one partner (monogamous).  Make sure you and your sexual partner are tested for STIs.  Wear cotton or cotton-lined underwear.  Avoid wearing tight pants and pantyhose, especially during summer.  Limit the amount of alcohol that you drink.  Do not use any products that  contain nicotine or tobacco, such as cigarettes and e-cigarettes. If you need help quitting, ask your health care provider.  Do not use illegal drugs. Where to find more information:  Centers for Disease Control and Prevention: AppraiserFraud.fi  American Sexual Health Association (ASHA): www.ashastd.org  [place] Department of Health and Financial controller, Office on Women's Health: DustingSprays.pl or SecuritiesCard.it Contact a health care provider if:  Your symptoms do not improve, even after treatment.  You have more discharge or pain when urinating.  You have a fever.  You have pain in your abdomen.  You have pain during sex.  You have vaginal bleeding between periods. Summary  Bacterial vaginosis is a vaginal infection that occurs when the normal balance of bacteria in the vagina is disrupted.  Because bacterial vaginosis increases your risk for STIs (sexually transmitted infections), getting treated can help reduce your risk for chlamydia, gonorrhea, herpes, and HIV (human immunodeficiency virus). Treatment is also important for preventing complications in pregnant women, because the condition can cause an early (premature) delivery.  This condition is treated with antibiotic medicines. These may be given as a pill, a vaginal cream, or a medicine that is put into the vagina (suppository). This information is not intended to replace advice given to you by your health care provider. Make sure you discuss any questions you have with your health care provider. Document Released: 10/16/2005 Document Revised: 07/01/2016 Document Reviewed: 07/01/2016 Elsevier Interactive Patient Education  2017 [employer]. Vaginal Yeast infection, Adult Vaginal yeast infection is a condition that causes soreness, swelling, and redness (inflammation) of the vagina. It also causes vaginal discharge. This is a common condition. Some women get this infection  frequently. What are the causes? This condition is caused by a change in the normal balance of the yeast (candida) and bacteria that live in the vagina. This change causes an overgrowth of yeast, which causes the inflammation. What increases the risk? This condition is more likely to develop in:  Women who take antibiotic medicines.  Women who have diabetes.  Women who take birth control pills.  Women who are pregnant.  Women who douche often.  Women who have a weak defense (immune) system.  Women who have been taking steroid medicines for a long time.  Women who frequently wear tight clothing.  What are the signs or symptoms? Symptoms of this condition include:  White, thick vaginal discharge.  Swelling, itching, redness, and irritation of the vagina. The lips of the vagina (vulva) may be affected as well.  Pain or a burning feeling while urinating.  Pain during sex.  How is this diagnosed? This condition is diagnosed with a medical history and physical exam. This will include a pelvic exam. Your health care provider will examine a sample of your vaginal discharge under a microscope. Your health care provider may send this sample for testing to confirm the diagnosis. How is this treated? This condition is treated with medicine. Medicines may be over-the-counter or prescription. You may be told to use one or more of the following:  Medicine that is taken orally.  Medicine that is applied as a cream.  Medicine that is inserted directly into the vagina (suppository).  Follow these instructions at home:  Take or apply over-the-counter and prescription medicines only as told by your health care provider.  Do not have sex until your health care provider has approved. Tell your sex partner that you have a yeast infection. That person should go to his or her health care provider if he or she develops symptoms.  Do not wear tight clothes, such as pantyhose or tight  pants.  Avoid using tampons until your health care provider approves.  Eat more yogurt. This may help to keep your yeast infection from returning.  Try taking a sitz bath to help with discomfort. This is a warm water bath that is taken while you are sitting down. The water should only come up to your hips and should cover your buttocks. Do this 3-4 times per day or as told by your health care provider.  Do not douche.  Wear breathable, cotton underwear.  If you have diabetes, keep your blood sugar levels under control. Contact a health care provider if:  You have a fever.  Your symptoms go away and then return.  Your symptoms do not get better with treatment.  Your symptoms get worse.  You have new symptoms.  You develop blisters in or around your vagina.  You have blood coming from your vagina and it is not your menstrual period.  You develop pain in your abdomen. This information is not intended to replace advice given to you by your health care provider. Make sure you discuss any questions you have with your health care provider. Document Released: 07/26/2005 Document Revised: 03/29/2016 Document Reviewed: 04/19/2015 Elsevier Interactive Patient Education  2018 [employer]. Fluconazole tablets What is this medicine? FLUCONAZOLE (floo KON na zole) is an antifungal medicine. It is used to treat certain kinds of fungal or yeast infections. This medicine may be used for other purposes; ask your health care provider or pharmacist if you have questions. COMMON BRAND NAME(S): Diflucan What should I tell my health care provider before I take this medicine? They need to know if you have any of these conditions: -history of irregular heart beat -kidney disease -an unusual or allergic reaction to fluconazole, other azole antifungals, medicines, foods, dyes, or preservatives -pregnant or trying to get pregnant -breast-feeding How should I use this medicine? Take this medicine  by mouth. Follow the directions on the prescription label. Do not take your medicine more often than directed. Talk to your pediatrician regarding the use of this medicine in children. Special care may be needed. This medicine has been used in children as young as 29 months of age. Overdosage: If you think you have taken too much of this medicine contact a poison control center or emergency room at once. NOTE: This medicine is only for you. Do not share this medicine with others. What if I miss a dose? If you miss a dose, take it as soon as you can. If it is almost time for your next dose, take only that dose. Do not take double or extra doses. What may interact with this medicine? Do not take this medicine with any of the following medications: -astemizole -certain medicines for irregular heart beat like dofetilide, dronedarone, quinidine -cisapride -erythromycin -lomitapide -other medicines that prolong the QT interval (cause an abnormal heart rhythm) -pimozide -terfenadine -thioridazine -tolvaptan -ziprasidone This medicine may also interact with the following medications: -antiviral medicines for HIV or AIDS -birth control pills -certain antibiotics like rifabutin, rifampin -certain medicines for blood pressure like amlodipine, isradipine, felodipine, hydrochlorothiazide, losartan, nifedipine -certain medicines  for cancer like cyclophosphamide, vinblastine, vincristine -certain medicines for cholesterol like atorvastatin, lovastatin, fluvastatin, simvastatin -certain medicines for depression, anxiety, or psychotic disturbances like amitriptyline, midazolam, nortriptyline, triazolam -certain medicines for diabetes like glipizide, glyburide, tolbutamide -certain medicines for pain like alfentanil, fentanyl, methadone -certain medicines for seizures like carbamazepine, phenytoin -certain medicines that treat or prevent blood clots like warfarin -halofantrine -medicines that lower your  chance of fighting infection like cyclosporine, prednisone, tacrolimus -NSAIDS, medicines for pain and inflammation, like celecoxib, diclofenac, flurbiprofen, ibuprofen, meloxicam, naproxen -other medicines for fungal infections -sirolimus -theophylline -tofacitinib This list may not describe all possible interactions. Give your health care provider a list of all the medicines, herbs, non-prescription drugs, or dietary supplements you use. Also tell them if you smoke, drink alcohol, or use illegal drugs. Some items may interact with your medicine. What should I watch for while using this medicine? Visit your doctor or health care professional for regular checkups. If you are taking this medicine for a long time you may need blood work. Tell your doctor if your symptoms do not improve. Some fungal infections need many weeks or months of treatment to cure. Alcohol can increase possible damage to your liver. Avoid alcoholic drinks. If you have a vaginal infection, do not have sex until you have finished your treatment. You can wear a sanitary napkin. Do not use tampons. Wear freshly washed cotton, not synthetic, panties. What side effects may I notice from receiving this medicine? Side effects that you should report to your doctor or health care professional as soon as possible: -allergic reactions like skin rash or itching, hives, swelling of the lips, mouth, tongue, or throat -dark urine -feeling dizzy or faint -irregular heartbeat or chest pain -redness, blistering, peeling or loosening of the skin, including inside the mouth -trouble breathing -unusual bruising or bleeding -vomiting -yellowing of the eyes or skin Side effects that usually do not require medical attention (report to your doctor or health care professional if they continue or are bothersome): -changes in how food tastes -diarrhea -headache -stomach upset or nausea This list may not describe all possible side effects. Call  your doctor for medical advice about side effects. You may report side effects to FDA at 1-800-FDA-1088. Where should I keep my medicine? Keep out of the reach of children. Store at room temperature below 30 degrees C (86 degrees F). Throw away any medicine after the expiration date. NOTE: This sheet is a summary. It may not cover all possible information. If you have questions about this medicine, talk to your doctor, pharmacist, or health care provider.  2018 Elsevier/Gold Standard (2013-05-24 19:37:38) Metronidazole tablets or capsules What is this medicine? METRONIDAZOLE (me troe NI da zole) is an antiinfective. It is used to treat certain kinds of bacterial and protozoal infections. It will not work for colds, flu, or other viral infections. This medicine may be used for other purposes; ask your health care provider or pharmacist if you have questions. COMMON BRAND NAME(S): Flagyl What should I tell my health care provider before I take this medicine? They need to know if you have any of these conditions: -anemia or other blood disorders -disease of the nervous system -fungal or yeast infection -if you drink alcohol containing drinks -liver disease -seizures -an unusual or allergic reaction to metronidazole, or other medicines, foods, dyes, or preservatives -pregnant or trying to get pregnant -breast-feeding How should I use this medicine? Take this medicine by mouth with a full glass of water. Follow the directions  on the prescription label. Take your medicine at regular intervals. Do not take your medicine more often than directed. Take all of your medicine as directed even if you think you are better. Do not skip doses or stop your medicine early. Talk to your pediatrician regarding the use of this medicine in children. Special care may be needed. Overdosage: If you think you have taken too much of this medicine contact a poison control center or emergency room at once. NOTE: This  medicine is only for you. Do not share this medicine with others. What if I miss a dose? If you miss a dose, take it as soon as you can. If it is almost time for your next dose, take only that dose. Do not take double or extra doses. What may interact with this medicine? Do not take this medicine with any of the following medications: -alcohol or any product that contains alcohol -amprenavir oral solution -cisapride -disulfiram -dofetilide -dronedarone -paclitaxel injection -pimozide -ritonavir oral solution -sertraline oral solution -sulfamethoxazole-trimethoprim injection -thioridazine -ziprasidone This medicine may also interact with the following medications: -birth control pills -cimetidine -lithium -other medicines that prolong the QT interval (cause an abnormal heart rhythm) -phenobarbital -phenytoin -warfarin This list may not describe all possible interactions. Give your health care provider a list of all the medicines, herbs, non-prescription drugs, or dietary supplements you use. Also tell them if you smoke, drink alcohol, or use illegal drugs. Some items may interact with your medicine. What should I watch for while using this medicine? Tell your doctor or health care professional if your symptoms do not improve or if they get worse. You may get drowsy or dizzy. Do not drive, use machinery, or do anything that needs mental alertness until you know how this medicine affects you. Do not stand or sit up quickly, especially if you are an older patient. This reduces the risk of dizzy or fainting spells. Avoid alcoholic drinks while you are taking this medicine and for three days afterward. Alcohol may make you feel dizzy, sick, or flushed. If you are being treated for a sexually transmitted disease, avoid sexual contact until you have finished your treatment. Your sexual partner may also need treatment. What side effects may I notice from receiving this medicine? Side effects  that you should report to your doctor or health care professional as soon as possible: -allergic reactions like skin rash or hives, swelling of the face, lips, or tongue -confusion, clumsiness -difficulty speaking -discolored or sore mouth -dizziness -fever, infection -numbness, tingling, pain or weakness in the hands or feet -trouble passing urine or change in the amount of urine -redness, blistering, peeling or loosening of the skin, including inside the mouth -seizures -unusually weak or tired -vaginal irritation, dryness, or discharge Side effects that usually do not require medical attention (report to your doctor or health care professional if they continue or are bothersome): -diarrhea -headache -irritability -metallic taste -nausea -stomach pain or cramps -trouble sleeping This list may not describe all possible side effects. Call your doctor for medical advice about side effects. You may report side effects to FDA at 1-800-FDA-1088. Where should I keep my medicine? Keep out of the reach of children. Store at room temperature below 25 degrees C (77 degrees F). Protect from light. Keep container tightly closed. Throw away any unused medicine after the expiration date. NOTE: This sheet is a summary. It may not cover all possible information. If you have questions about this medicine, talk to your doctor, pharmacist,  or health care provider.  2018 Elsevier/Gold Standard (2013-05-23 14:08:39)

## 2017-06-05 ENCOUNTER — Other Ambulatory Visit: Payer: BLUE CROSS/BLUE SHIELD

## 2017-06-12 ENCOUNTER — Encounter: Payer: BLUE CROSS/BLUE SHIELD | Admitting: Internal Medicine

## 2017-06-22 ENCOUNTER — Encounter: Payer: BLUE CROSS/BLUE SHIELD | Admitting: Internal Medicine

## 2017-06-28 ENCOUNTER — Encounter: Payer: BLUE CROSS/BLUE SHIELD | Admitting: Internal Medicine

## 2017-07-20 ENCOUNTER — Encounter: Payer: Self-pay | Admitting: Internal Medicine

## 2017-08-09 ENCOUNTER — Telehealth: Payer: Self-pay | Admitting: *Deleted

## 2017-08-09 NOTE — Telephone Encounter (Signed)
Pt will be continuing care with you, annual in Dec. Pt called c/o yeast infection itching and white discharge asked if you would be willing to prescribed diflucan tablet? Please advise

## 2017-08-12 NOTE — Telephone Encounter (Signed)
Agree with Fluconazole 150 mg/tab 1 tab PO daily x 3 days.  #3 tab, refill x 2.  If symptoms persist, schedule visit to evaluate.

## 2017-08-13 MED ORDER — FLUCONAZOLE 150 MG PO TABS: 150.0000 mg | ORAL_TABLET | Freq: Every day | ORAL | 2 refills | Status: DC

## 2017-08-13 NOTE — Telephone Encounter (Signed)
Patient aware Rx sent.  

## 2017-08-24 ENCOUNTER — Encounter: Payer: Self-pay | Admitting: Obstetrics & Gynecology

## 2017-08-24 DIAGNOSIS — Z1231 Encounter for screening mammogram for malignant neoplasm of breast: Secondary | ICD-10-CM | POA: Diagnosis not present

## 2017-08-28 NOTE — Progress Notes (Signed)
Chief Complaint  Patient presents with  . Annual Exam    Increased fatigue.     HPI: Patient  Brandy Maynard  46 y.o. comes in today for Preventive Health Care visit  Since her last visit she has been doing pretty well although just getting over a cold. Interested in making sure her blood sugar cholesterol is okay and history of iron deficiency.  She is now on a IUD Mirena does not bleed much. Sees GYN in December.  Orthotics  Dr Paulla Dolly  Dr bassett in spring  PF arthritis  Injected doing better   Health Maintenance  Topic Date Due  . HIV Screening  01/13/1984  . INFLUENZA VACCINE  05/30/2017  . PAP SMEAR  09/29/2017 (Originally 06/03/2015)  . MAMMOGRAM  08/24/2018  . 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 GI /GU: No adominal pain, vomiting, change in bowel habits. No blood in the stool. No significant GU symptoms. SKIN/HEME: ,no acute skin rashes suspicious lesions or bleeding. No lymphadenopathy, nodules, masses.  NEURO/ PSYCH:  No neurologic signs such as weakness numbness. No depression anxiety. IMM/ Allergy: No unusual infections.  Allergy .   REST of 12 system review negative except as per HPI   Past Medical History:  Diagnosis Date  . Allergy   . Anemia   . ANEMIA 12/15/2009   Qualifier: Diagnosis of  By: Regis Bill MD, Standley Brooking   . Depression   . GERD (gastroesophageal reflux disease)   . Heavy menses    on ocps  . IBS (irritable bowel syndrome)    by hx and had a neg colonscopy    . Migraines     Past Surgical History:  Procedure Laterality Date  . CHOLECYSTECTOMY    . COLONOSCOPY  08/03/2005  . COLONOSCOPY W/ BIOPSIES  Nov 12 2015  . ESOPHAGOGASTRODUODENOSCOPY ENDOSCOPY  Nov 12 2015  . FOOT SURGERY    . KNEE ARTHROSCOPY  2012   right knee,,, left knee at 15  . LIGAMENT REPAIR  2002   Rt ankle  . PELVIC LAPAROSCOPY  10/11/2005   LAPAROTOMY, LYSIS OF PELVIC ADHESIONS, RSO AND CAUTERIZATION AND TRANSECTION OF LEFT FALLOPIAN TUBE  . PLICA, arthritis and cartilage repair  01/11/2011   on Rt knee  . TUBAL LIGATION  09/2005   scar tissue removal ovarian cyst    Family History  Problem Relation Age of Onset  . Arthritis Mother   . Hypertension Mother   . Cancer Mother        MELANOMA  . Hyperlipidemia Mother   . Hypertension Maternal Grandmother   . Dementia Maternal Grandmother   . Hypertension Maternal Grandfather   . Cancer Maternal Grandfather        MELANOMA  . Heart disease Paternal Grandmother   . Diabetes Paternal Grandfather   . Heart disease Paternal Grandfather   . Heart disease Father   . Cancer - Ovarian Other        Mat Great Grandmother  Social History   Social History  . Marital status: Married    Spouse name: N/A  . Number of children: N/A  . Years of education: N/A   Social History Main Topics  . Smoking status: Never Smoker  . Smokeless tobacco: Never Used  . Alcohol use 0.0 oz/week  . Drug use: No  . Sexual activity: Yes    Birth control/ protection: IUD   Other Topics Concern  . None   Social History Narrative   Occupation: unemployed   Married   [place] of 3-4    Pet dogs 2   [id number]             Outpatient Medications Prior to Visit  Medication Sig Dispense Refill  . cetirizine (ZYRTEC) 10 MG tablet Take 10 mg by mouth daily. Reported on 12/03/2015    . levonorgestrel (MIRENA) 20 MCG/24HR IUD 1 Intra Uterine Device (1 each total) by Intrauterine route once. 1 each 0  . omeprazole (PRILOSEC OTC) 20 MG tablet  Take 20 mg by mouth daily.     . benzonatate (TESSALON) 100 MG capsule Take 100 mg by mouth 3 (three) times daily as needed for cough.    . fluconazole (DIFLUCAN) 150 MG tablet Take 1 tablet (150 mg total) by mouth daily. (Patient not taking: Reported on 08/29/2017) 3 tablet 2  . ipratropium (ATROVENT) 0.06 % nasal spray Place 2 sprays into both nostrils 3 (three) times daily.    . metroNIDAZOLE (FLAGYL) 500 MG tablet Take 1 tablet (500 mg total) by mouth 2 (two) times daily. (Patient not taking: Reported on 08/29/2017) 14 tablet 0   No facility-administered medications prior to visit.      EXAM:  BP 112/88 (BP Location: Right Arm, Patient Position: Sitting, Cuff Size: Large)   Pulse 80   Temp 98 F (36.7 C) (Oral)   Ht 5' 4.5" (1.638 m)   Wt 205 lb 8 oz (93.2 kg)   BMI 34.73 kg/m   Body mass index is 34.73 kg/m. Wt Readings from Last 3 Encounters:  08/29/17 205 lb 8 oz (93.2 kg)  06/01/17 207 lb 6.4 oz (94.1 kg)  04/13/17 211 lb 3.2 oz (95.8 kg)    Physical Exam: Vital signs reviewed VZC:HYIF is a well-developed well-nourished alert cooperative    who appearsr stated age in no acute distress.  HEENT: normocephalic atraumatic , Eyes: PERRL EOM's full, conjunctiva clear, Nares: paten,t no deformity discharge or tenderness., Ears: no deformity EAC's clear TMs with normal landmarks. Mouth: clear OP, no lesions, edema.  Moist mucous membranes. Dentition in adequate repair. NECK: supple without masses, thyromegaly or bruits. CHEST/PULM:  Clear to auscultation and percussion breath sounds equal no wheeze , rales or rhonchi. No chest wall deformities or tenderness. Breast: defered  To be done by gyne. CV: PMI is nondisplaced, S1 S2 no gallops, murmurs, rubs. Peripheral pulses are full without delay.No JVD .  ABDOMEN: Bowel sounds normal nontender  No guard or rebound, no hepato splenomegal no CVA tenderness.  . Extremtities:  No clubbing cyanosis or edema, no acute joint swelling or  redness no focal atrophy NEURO:  Oriented x3, cranial nerves 3-12 appear to be intact, no obvious focal weakness,gait within normal limits no abnormal reflexes or asymmetrical SKIN: No acute rashes normal turgor, color, no bruising or petechiae. PSYCH: Oriented, good eye contact, no obvious depression anxiety, cognition and judgment appear normal. LN: no cervical axillaryl adenopathy  Lab Results  Component Value Date   WBC 8.2 05/29/2016  HGB 14.1 05/29/2016   HCT 41.5 05/29/2016   PLT 232.0 05/29/2016   GLUCOSE 110 (H) 05/29/2016   CHOL 151 05/29/2016   TRIG 61.0 05/29/2016   HDL 50.90 05/29/2016   LDLCALC 88 05/29/2016   ALT 11 09/06/2016   AST 14 09/06/2016   NA 138 05/29/2016   K 4.1 05/29/2016   CL 105 05/29/2016   CREATININE 0.80 05/29/2016   BUN 18 05/29/2016   CO2 26 05/29/2016   TSH 2.40 05/29/2016   HGBA1C 5.9 09/06/2016    BP Readings from Last 3 Encounters:  08/29/17 112/88  06/01/17 122/78  04/13/17 108/80    Lab results reviewed with patient   ASSESSMENT AND PLAN:  Discussed the following assessment and plan:  Visit for preventive health examination - Plan: Basic metabolic panel, CBC with Differential/Platelet, Hemoglobin A1c, Hepatic function panel, Lipid panel, TSH, Iron, TIBC and Ferritin Panel  Hyperglycemia - Plan: Basic metabolic panel, CBC with Differential/Platelet, Hemoglobin A1c, Hepatic function panel, Lipid panel, TSH, Iron, TIBC and Ferritin Panel  Need for influenza vaccination - Plan: Flu Vaccine QUAD 6+ mos PF IM (Fluarix Quad PF)  Medication management - Plan: Basic metabolic panel, CBC with Differential/Platelet, Hemoglobin A1c, Hepatic function panel, Lipid panel, TSH, Iron, TIBC and Ferritin Panel  Iron deficiency - Plan: Basic metabolic panel, CBC with Differential/Platelet, Hemoglobin A1c, Hepatic function panel, Lipid panel, TSH, Iron, TIBC and Ferritin Panel hyperglycemia of note patient is not diabetic but the electronic  record flags her as such for health maintenance.  Edited modifiers as best possible today. Patient Care Team: Burnis Medin, MD as PCP - Lorenza Cambridge, MD (Orthopedic Surgery) Terrance Mass, MD (Obstetrics and Gynecology) Jerrell Belfast, MD (Otolaryngology) Patient Instructions  Glad you are doing well continue healthy lifestyle exercise Attend Mediterranean eating.  This will help with healthy weight loss 2. Will be contacted about lab results when available   Preventive Care 40-64 Years, Female Preventive care refers to lifestyle choices and visits with your health care provider that can promote health and wellness. What does preventive care include?  A yearly physical exam. This is also called an annual well check.  Dental exams once or twice a year.  Routine eye exams. Ask your health care provider how often you should have your eyes checked.  Personal lifestyle choices, including: ? Daily care of your teeth and gums. ? Regular physical activity. ? Eating a healthy diet. ? Avoiding tobacco and drug use. ? Limiting alcohol use. ? Practicing safe sex. ? Taking low-dose aspirin daily starting at age 71. ? Taking vitamin and mineral supplements as recommended by your health care provider. What happens during an annual well check? The services and screenings done by your health care provider during your annual well check will depend on your age, overall health, lifestyle risk factors, and family history of disease. Counseling Your health care provider may ask you questions about your:  Alcohol use.  Tobacco use.  Drug use.  Emotional well-being.  Home and relationship well-being.  Sexual activity.  Eating habits.  Work and work Statistician.  Method of birth control.  Menstrual cycle.  Pregnancy history.  Screening You may have the following tests or measurements:  Height, weight, and BMI.  Blood pressure.  Lipid and cholesterol levels.  These may be checked every 5 years, or more frequently if you are over 104 years old.  Skin check.  Lung cancer screening. You may have this screening every year starting at age 23  if you have a 30-pack-year history of smoking and currently smoke or have quit within the past 15 years.  Fecal occult blood test (FOBT) of the stool. You may have this test every year starting at age 7.  Flexible sigmoidoscopy or colonoscopy. You may have a sigmoidoscopy every 5 years or a colonoscopy every 10 years starting at age 41.  Hepatitis C blood test.  Hepatitis B blood test.  Sexually transmitted disease (STD) testing.  Diabetes screening. This is done by checking your blood sugar (glucose) after you have not eaten for a while (fasting). You may have this done every 1-3 years.  Mammogram. This may be done every 1-2 years. Talk to your health care provider about when you should start having regular mammograms. This may depend on whether you have a family history of breast cancer.  BRCA-related cancer screening. This may be done if you have a family history of breast, ovarian, tubal, or peritoneal cancers.  Pelvic exam and Pap test. This may be done every 3 years starting at age 56. Starting at age 48, this may be done every 5 years if you have a Pap test in combination with an HPV test.  Bone density scan. This is done to screen for osteoporosis. You may have this scan if you are at high risk for osteoporosis.  Discuss your test results, treatment options, and if necessary, the need for more tests with your health care provider. Vaccines Your health care provider may recommend certain vaccines, such as:  Influenza vaccine. This is recommended every year.  Tetanus, diphtheria, and acellular pertussis (Tdap, Td) vaccine. You may need a Td booster every 10 years.  Varicella vaccine. You may need this if you have not been vaccinated.  Zoster vaccine. You may need this after age 19.  Measles,  mumps, and rubella (MMR) vaccine. You may need at least one dose of MMR if you were born in 1957 or later. You may also need a second dose.  Pneumococcal 13-valent conjugate (PCV13) vaccine. You may need this if you have certain conditions and were not previously vaccinated.  Pneumococcal polysaccharide (PPSV23) vaccine. You may need one or two doses if you smoke cigarettes or if you have certain conditions.  Meningococcal vaccine. You may need this if you have certain conditions.  Hepatitis A vaccine. You may need this if you have certain conditions or if you travel or work in places where you may be exposed to hepatitis A.  Hepatitis B vaccine. You may need this if you have certain conditions or if you travel or work in places where you may be exposed to hepatitis B.  Haemophilus influenzae type b (Hib) vaccine. You may need this if you have certain conditions.  Talk to your health care provider about which screenings and vaccines you need and how often you need them. This information is not intended to replace advice given to you by your health care provider. Make sure you discuss any questions you have with your health care provider. Document Released: 11/12/2015 Document Revised: 07/05/2016 Document Reviewed: 08/17/2015 Elsevier Interactive Patient Education  2017 [place] K. Panosh M.D.

## 2017-08-29 ENCOUNTER — Ambulatory Visit (INDEPENDENT_AMBULATORY_CARE_PROVIDER_SITE_OTHER): Payer: BLUE CROSS/BLUE SHIELD | Admitting: Internal Medicine

## 2017-08-29 ENCOUNTER — Encounter: Payer: Self-pay | Admitting: Internal Medicine

## 2017-08-29 VITALS — BP 112/88 | HR 80 | Temp 98.0°F | Ht 64.5 in | Wt 205.5 lb

## 2017-08-29 DIAGNOSIS — Z23 Encounter for immunization: Secondary | ICD-10-CM | POA: Diagnosis not present

## 2017-08-29 DIAGNOSIS — Z79899 Other long term (current) drug therapy: Secondary | ICD-10-CM | POA: Diagnosis not present

## 2017-08-29 DIAGNOSIS — E611 Iron deficiency: Secondary | ICD-10-CM | POA: Diagnosis not present

## 2017-08-29 DIAGNOSIS — R739 Hyperglycemia, unspecified: Secondary | ICD-10-CM | POA: Diagnosis not present

## 2017-08-29 DIAGNOSIS — Z0000 Encounter for general adult medical examination without abnormal findings: Secondary | ICD-10-CM | POA: Diagnosis not present

## 2017-08-29 LAB — CBC WITH DIFFERENTIAL/PLATELET
BASOS PCT: 0.5 % (ref 0.0–3.0)
Basophils Absolute: 0 10*3/uL (ref 0.0–0.1)
EOS PCT: 0.2 % (ref 0.0–5.0)
Eosinophils Absolute: 0 10*3/uL (ref 0.0–0.7)
HEMATOCRIT: 44.1 % (ref 36.0–46.0)
Hemoglobin: 14.7 g/dL (ref 12.0–15.0)
LYMPHS ABS: 1.7 10*3/uL (ref 0.7–4.0)
LYMPHS PCT: 17.7 % (ref 12.0–46.0)
MCHC: 33.2 g/dL (ref 30.0–36.0)
MCV: 90.7 fl (ref 78.0–100.0)
MONOS PCT: 4.4 % (ref 3.0–12.0)
Monocytes Absolute: 0.4 10*3/uL (ref 0.1–1.0)
NEUTROS ABS: 7.4 10*3/uL (ref 1.4–7.7)
NEUTROS PCT: 77.2 % — AB (ref 43.0–77.0)
PLATELETS: 259 10*3/uL (ref 150.0–400.0)
RBC: 4.87 Mil/uL (ref 3.87–5.11)
RDW: 12.2 % (ref 11.5–15.5)
WBC: 9.6 10*3/uL (ref 4.0–10.5)

## 2017-08-29 LAB — LIPID PANEL
CHOLESTEROL: 166 mg/dL (ref 0–200)
HDL: 50.2 mg/dL (ref >39.00)
LDL Cholesterol: 97 mg/dL (ref 0–99)
NonHDL: 116.05
TRIGLYCERIDES: 93 mg/dL (ref 0.0–149.0)
Total CHOL/HDL Ratio: 3
VLDL: 18.6 mg/dL (ref 0.0–40.0)

## 2017-08-29 LAB — BASIC METABOLIC PANEL
BUN: 20 mg/dL (ref 6–23)
CHLORIDE: 103 meq/L (ref 96–112)
CO2: 28 meq/L (ref 19–32)
Calcium: 9.9 mg/dL (ref 8.4–10.5)
Creatinine, Ser: 0.8 mg/dL (ref 0.40–1.20)
GFR: 81.15 mL/min (ref >60.00)
GLUCOSE: 98 mg/dL (ref 70–99)
POTASSIUM: 4 meq/L (ref 3.5–5.1)
SODIUM: 139 meq/L (ref 135–145)

## 2017-08-29 LAB — IRON,TIBC AND FERRITIN PANEL
%SAT: 46 % (ref 11–50)
FERRITIN: 76 ng/mL (ref 10–232)
IRON: 153 ug/dL (ref 40–190)
TIBC: 336 ug/dL (ref 250–450)

## 2017-08-29 LAB — HEPATIC FUNCTION PANEL
ALBUMIN: 4.3 g/dL (ref 3.5–5.2)
ALT: 12 U/L (ref 0–35)
AST: 15 U/L (ref 0–37)
Alkaline Phosphatase: 122 U/L — ABNORMAL HIGH (ref 39–117)
BILIRUBIN DIRECT: 0.1 mg/dL (ref 0.0–0.3)
Total Bilirubin: 0.7 mg/dL (ref 0.2–1.2)
Total Protein: 7.2 g/dL (ref 6.0–8.3)

## 2017-08-29 LAB — HEMOGLOBIN A1C: Hgb A1c MFr Bld: 6 % (ref 4.6–6.5)

## 2017-08-29 LAB — TSH: TSH: 1.66 u[IU]/mL (ref 0.35–4.50)

## 2017-08-29 NOTE — Patient Instructions (Addendum)
Glad you are doing well continue healthy lifestyle exercise Attend Mediterranean eating.  This will help with healthy weight loss 2. Will be contacted about lab results when available   Preventive Care 40-64 Years, Female Preventive care refers to lifestyle choices and visits with your health care provider that can promote health and wellness. What does preventive care include?  A yearly physical exam. This is also called an annual well check.  Dental exams once or twice a year.  Routine eye exams. Ask your health care provider how often you should have your eyes checked.  Personal lifestyle choices, including: ? Daily care of your teeth and gums. ? Regular physical activity. ? Eating a healthy diet. ? Avoiding tobacco and drug use. ? Limiting alcohol use. ? Practicing safe sex. ? Taking low-dose aspirin daily starting at age 24. ? Taking vitamin and mineral supplements as recommended by your health care provider. What happens during an annual well check? The services and screenings done by your health care provider during your annual well check will depend on your age, overall health, lifestyle risk factors, and family history of disease. Counseling Your health care provider may ask you questions about your:  Alcohol use.  Tobacco use.  Drug use.  Emotional well-being.  Home and relationship well-being.  Sexual activity.  Eating habits.  Work and work Statistician.  Method of birth control.  Menstrual cycle.  Pregnancy history.  Screening You may have the following tests or measurements:  Height, weight, and BMI.  Blood pressure.  Lipid and cholesterol levels. These may be checked every 5 years, or more frequently if you are over 15 years old.  Skin check.  Lung cancer screening. You may have this screening every year starting at age 70 if you have a 30-pack-year history of smoking and currently smoke or have quit within the past 15 years.  Fecal occult  blood test (FOBT) of the stool. You may have this test every year starting at age 31.  Flexible sigmoidoscopy or colonoscopy. You may have a sigmoidoscopy every 5 years or a colonoscopy every 10 years starting at age 44.  Hepatitis C blood test.  Hepatitis B blood test.  Sexually transmitted disease (STD) testing.  Diabetes screening. This is done by checking your blood sugar (glucose) after you have not eaten for a while (fasting). You may have this done every 1-3 years.  Mammogram. This may be done every 1-2 years. Talk to your health care provider about when you should start having regular mammograms. This may depend on whether you have a family history of breast cancer.  BRCA-related cancer screening. This may be done if you have a family history of breast, ovarian, tubal, or peritoneal cancers.  Pelvic exam and Pap test. This may be done every 3 years starting at age 10. Starting at age 70, this may be done every 5 years if you have a Pap test in combination with an HPV test.  Bone density scan. This is done to screen for osteoporosis. You may have this scan if you are at high risk for osteoporosis.  Discuss your test results, treatment options, and if necessary, the need for more tests with your health care provider. Vaccines Your health care provider may recommend certain vaccines, such as:  Influenza vaccine. This is recommended every year.  Tetanus, diphtheria, and acellular pertussis (Tdap, Td) vaccine. You may need a Td booster every 10 years.  Varicella vaccine. You may need this if you have not been vaccinated.  Zoster vaccine. You may need this after age 82.  Measles, mumps, and rubella (MMR) vaccine. You may need at least one dose of MMR if you were born in 1957 or later. You may also need a second dose.  Pneumococcal 13-valent conjugate (PCV13) vaccine. You may need this if you have certain conditions and were not previously vaccinated.  Pneumococcal polysaccharide  (PPSV23) vaccine. You may need one or two doses if you smoke cigarettes or if you have certain conditions.  Meningococcal vaccine. You may need this if you have certain conditions.  Hepatitis A vaccine. You may need this if you have certain conditions or if you travel or work in places where you may be exposed to hepatitis A.  Hepatitis B vaccine. You may need this if you have certain conditions or if you travel or work in places where you may be exposed to hepatitis B.  Haemophilus influenzae type b (Hib) vaccine. You may need this if you have certain conditions.  Talk to your health care provider about which screenings and vaccines you need and how often you need them. This information is not intended to replace advice given to you by your health care provider. Make sure you discuss any questions you have with your health care provider. Document Released: 11/12/2015 Document Revised: 07/05/2016 Document Reviewed: 08/17/2015 Elsevier Interactive Patient Education  2017 [employer].

## 2017-08-31 ENCOUNTER — Other Ambulatory Visit: Payer: Self-pay | Admitting: Podiatry

## 2017-08-31 ENCOUNTER — Ambulatory Visit (INDEPENDENT_AMBULATORY_CARE_PROVIDER_SITE_OTHER): Payer: BLUE CROSS/BLUE SHIELD | Admitting: Podiatry

## 2017-08-31 ENCOUNTER — Ambulatory Visit (INDEPENDENT_AMBULATORY_CARE_PROVIDER_SITE_OTHER): Payer: BLUE CROSS/BLUE SHIELD

## 2017-08-31 DIAGNOSIS — M79672 Pain in left foot: Secondary | ICD-10-CM

## 2017-08-31 DIAGNOSIS — M7742 Metatarsalgia, left foot: Secondary | ICD-10-CM

## 2017-08-31 DIAGNOSIS — M7750 Other enthesopathy of unspecified foot: Secondary | ICD-10-CM

## 2017-08-31 DIAGNOSIS — M779 Enthesopathy, unspecified: Secondary | ICD-10-CM

## 2017-08-31 DIAGNOSIS — M7741 Metatarsalgia, right foot: Secondary | ICD-10-CM

## 2017-08-31 DIAGNOSIS — M21961 Unspecified acquired deformity of right lower leg: Secondary | ICD-10-CM | POA: Diagnosis not present

## 2017-08-31 DIAGNOSIS — G5761 Lesion of plantar nerve, right lower limb: Secondary | ICD-10-CM

## 2017-08-31 DIAGNOSIS — G5781 Other specified mononeuropathies of right lower limb: Secondary | ICD-10-CM

## 2017-08-31 DIAGNOSIS — M79671 Pain in right foot: Secondary | ICD-10-CM

## 2017-09-02 NOTE — Progress Notes (Signed)
Subjective:  Patient ID: Brandy Maynard, female    DOB: August 04, 1971,  MRN: [id number]  Chief Complaint  Patient presents with  . Foot Pain    B/L plantar foot pain x 2 mos; 8/10 achy pain. Tx: stretching. Pt. stated,"the ortotics might be causing the pain because they are 46 years old."  . Numbness    R. 2nd toe numbness x 1 mos. Pt. stated,"concern about the numbness and being longer than the rest."   46 y.o. female presents with the above complaint.  History of right foot surgery metatarsal osteotomy.  Reports right second toe numbness reports chronic bilateral foot pain aching in nature has had orthotics but states that 46 years old and request a new pair  Past Medical History:  Diagnosis Date  . Allergy   . Anemia   . ANEMIA 12/15/2009   Qualifier: Diagnosis of  By: Regis Bill MD, Standley Brooking   . Depression   . GERD (gastroesophageal reflux disease)   . Heavy menses    on ocps  . IBS (irritable bowel syndrome)    by hx and had a neg colonscopy  . Migraines    Past Surgical History:  Procedure Laterality Date  . CHOLECYSTECTOMY    . COLONOSCOPY  08/03/2005  . COLONOSCOPY W/ BIOPSIES  Nov 12 2015  . ESOPHAGOGASTRODUODENOSCOPY ENDOSCOPY  Nov 12 2015  . FOOT SURGERY    . KNEE ARTHROSCOPY  2012   right knee,,, left knee at 15  . LIGAMENT REPAIR  2002   Rt ankle  . PELVIC LAPAROSCOPY  10/11/2005   LAPAROTOMY, LYSIS OF PELVIC ADHESIONS, RSO AND CAUTERIZATION AND TRANSECTION OF LEFT FALLOPIAN TUBE  . PLICA, arthritis and cartilage repair  01/11/2011   on Rt knee  . TUBAL LIGATION  09/2005   scar tissue removal ovarian cyst    Current Outpatient Medications:  .  cetirizine (ZYRTEC) 10 MG tablet, Take 10 mg by mouth daily. Reported on 12/03/2015, Disp: , Rfl:  .  levonorgestrel (MIRENA) 20 MCG/24HR IUD, 1 Intra Uterine Device (1 each total) by Intrauterine route once., Disp: 1 each, Rfl: 0 .  omeprazole (PRILOSEC OTC) 20 MG tablet, Take 20 mg by mouth daily. , Disp: , Rfl:   Allergies    Allergen Reactions  . Junel Fe 1-20 [Norethin Ace-Eth Estrad-Fe] Other (See Comments)     migraine  With visual aura ;elevated blood pressures  . Penicillins     Hives. Eyes swell shut.   . Vicodin [Hydrocodone-Acetaminophen] Itching   Review of Systems Objective:  There were no vitals filed for this visit. General AA&O x3. Normal mood and affect.  Vascular Dorsalis pedis and posterior tibial pulses  present 2+ bilaterally  Capillary refill normal to all digits. Pedal hair growth normal.  Neurologic Epicritic sensation grossly present.  Dermatologic No open lesions. Interspaces clear of maceration. Nails well groomed and normal in appearance.  Orthopedic: MMT 5/5 in dorsiflexion, plantarflexion, inversion, and eversion. Normal joint ROM without pain or crepitus. Pain on palpation R 2nd interspace plantarly. Pain on palpation R 2nd/3rd metatarsal heads.   Radiographs reviewed. R 3rd metatarsal osteotomy with intact hardware. Metatarsal parabola abnormal: elongated 2nd metatarsal / shortened 1st metatarsal.  Assessment & Plan:  Patient was evaluated and treated and all questions answered.  Neuroma R Foot -Injection delievered as below. -Will incorporate neuroma pad into orthotics.  Procedure: Neuroma Injection Location: Right 2nd interspace Skin Prep: Alcohol. Injectate: 0.5 cc 0.5% marcaine plain, 0.5 cc dexamethasone phosphate. Disposition: Patient tolerated procedure  well. Injection site dressed with a band-aid.  Metatarsal Deformity R -S/p R 2nd metatarsal Weil osteotomy. -Elongated 2nd metatarsal contributing to metatarsalgia. -Discussed surgical intervention would entail restoring the metatarsal parabola.  Return in about 3 weeks (around 09/21/2017) for neuroma .

## 2017-09-24 ENCOUNTER — Encounter: Payer: Self-pay | Admitting: Podiatry

## 2017-09-24 ENCOUNTER — Ambulatory Visit: Payer: BLUE CROSS/BLUE SHIELD | Admitting: Podiatry

## 2017-09-24 DIAGNOSIS — G5781 Other specified mononeuropathies of right lower limb: Secondary | ICD-10-CM

## 2017-09-24 DIAGNOSIS — M7750 Other enthesopathy of unspecified foot: Secondary | ICD-10-CM | POA: Diagnosis not present

## 2017-09-24 DIAGNOSIS — G5761 Lesion of plantar nerve, right lower limb: Secondary | ICD-10-CM

## 2017-09-24 DIAGNOSIS — M779 Enthesopathy, unspecified: Secondary | ICD-10-CM

## 2017-09-24 MED ORDER — TRIAMCINOLONE ACETONIDE 10 MG/ML IJ SUSP
10.0000 mg | Freq: Once | INTRAMUSCULAR | Status: AC
  Administered 2017-09-24: 10 mg

## 2017-09-25 NOTE — Progress Notes (Signed)
Subjective:    Patient ID: Brandy Maynard, female   DOB: 46 y.o.   MRN: [id number]   HPI patient states that she seemed to have some improvement with what we did last few weeks ago but she still having some burning in her second toe and it seems to have come back to where it was before    ROS      Objective:  Physical Exam neurovascular status intact with patient found to have an E long gaited second toe right with distal contracture and pain that seems to be mostly around the distal interphalangeal joint with current negative Biagio Borg sign noted     Assessment:   Very difficult to determine if the symptoms are related to arthritis of the joint surface with elongation of the toe or possible nerve compression or other pathology      Plan:    H&P on conditions reviewed with patient. Today I did a proximal nerve block of the second digit and did a small interphalangeal injection with cortisone Kenalog and I applied cushion to the toe. I want to see his back in 3 weeks to make a determination

## 2017-10-03 ENCOUNTER — Ambulatory Visit: Payer: BLUE CROSS/BLUE SHIELD | Admitting: Orthotics

## 2017-10-03 DIAGNOSIS — M7742 Metatarsalgia, left foot: Secondary | ICD-10-CM

## 2017-10-03 DIAGNOSIS — M21961 Unspecified acquired deformity of right lower leg: Secondary | ICD-10-CM

## 2017-10-03 DIAGNOSIS — G5781 Other specified mononeuropathies of right lower limb: Secondary | ICD-10-CM

## 2017-10-03 DIAGNOSIS — M7741 Metatarsalgia, right foot: Secondary | ICD-10-CM

## 2017-10-07 NOTE — Progress Notes (Signed)
Patient came in today to pick up custom made foot orthotics.  The goals were accomplished and the patient reported no dissatisfaction with said orthotics.  Patient was advised of breakin period and how to report any issues. 

## 2017-10-12 ENCOUNTER — Encounter: Payer: BLUE CROSS/BLUE SHIELD | Admitting: Obstetrics & Gynecology

## 2017-10-15 ENCOUNTER — Ambulatory Visit: Payer: BLUE CROSS/BLUE SHIELD | Admitting: Podiatry

## 2017-10-16 ENCOUNTER — Encounter: Payer: BLUE CROSS/BLUE SHIELD | Admitting: Obstetrics & Gynecology

## 2017-10-17 ENCOUNTER — Encounter: Payer: Self-pay | Admitting: Obstetrics & Gynecology

## 2017-10-17 ENCOUNTER — Ambulatory Visit (INDEPENDENT_AMBULATORY_CARE_PROVIDER_SITE_OTHER): Payer: BLUE CROSS/BLUE SHIELD | Admitting: Obstetrics & Gynecology

## 2017-10-17 VITALS — BP 124/86 | Ht 65.5 in | Wt 212.0 lb

## 2017-10-17 DIAGNOSIS — Z975 Presence of (intrauterine) contraceptive device: Secondary | ICD-10-CM

## 2017-10-17 DIAGNOSIS — Z01419 Encounter for gynecological examination (general) (routine) without abnormal findings: Secondary | ICD-10-CM

## 2017-10-17 DIAGNOSIS — B373 Candidiasis of vulva and vagina: Secondary | ICD-10-CM

## 2017-10-17 DIAGNOSIS — B3731 Acute candidiasis of vulva and vagina: Secondary | ICD-10-CM

## 2017-10-17 MED ORDER — FLUCONAZOLE 150 MG PO TABS: 150.0000 mg | ORAL_TABLET | Freq: Every day | ORAL | 3 refills | Status: AC

## 2017-10-17 NOTE — Progress Notes (Signed)
Brandy Maynard 1971/05/08 [id number]   History:    46 y.o. Married x 29 yrs.  2 girls 17 and 46 yo.  RP:  Established patient presented for annual gyn exam  HPI:  Well on Mirena since April 2016.  Very light rare menses.  No pelvic pain.  Complains of increased vaginal discharge with itching currently.  Breasts normal.  Urine and bowel movements normal.  Past medical history,surgical history, family history and social history were all reviewed and documented in the EPIC chart.  Gynecologic History No LMP recorded. Patient is not currently having periods (Reason: IUD). Contraception:Mirena IUD x 01/2015 Last Pap: 2015. Results were: normal Last mammogram: 07/2017. Results were: Benign Colono 2017 neg  Obstetric History OB History  Gravida Para Term Preterm AB Living  3 2 2   1 2   SAB TAB Ectopic Multiple Live Births  1       2    # Outcome Date GA Lbr Len/2nd Weight Sex Delivery Anes PTL Lv  3 SAB           2 Term     F Vag-Spont  N LIV  1 Term     F Vag-Spont  N LIV       ROS: A ROS was performed and pertinent positives and negatives are included in the history.  GENERAL: No fevers or chills. HEENT: No change in vision, no earache, sore throat or sinus congestion. NECK: No pain or stiffness. CARDIOVASCULAR: No chest pain or pressure. No palpitations. PULMONARY: No shortness of breath, cough or wheeze. GASTROINTESTINAL: No abdominal pain, nausea, vomiting or diarrhea, melena or bright red blood per rectum. GENITOURINARY: No urinary frequency, urgency, hesitancy or dysuria. MUSCULOSKELETAL: No joint or muscle pain, no back pain, no recent trauma. DERMATOLOGIC: No rash, no itching, no lesions. ENDOCRINE: No polyuria, polydipsia, no heat or cold intolerance. No recent change in weight. HEMATOLOGICAL: No anemia or easy bruising or bleeding. NEUROLOGIC: No headache, seizures, numbness, tingling or weakness. PSYCHIATRIC: No depression, no loss of interest in normal activity or change  in sleep pattern.     Exam:   BP 124/86   Ht 5' 5.5" (1.664 m)   Wt 212 lb (96.2 kg)   BMI 34.74 kg/m   Body mass index is 34.74 kg/m.  General appearance : Well developed well nourished female. No acute distress HEENT: Eyes: no retinal hemorrhage or exudates,  Neck supple, trachea midline, no carotid bruits, no thyroidmegaly Lungs: Clear to auscultation, no rhonchi or wheezes, or rib retractions  Heart: Regular rate and rhythm, no murmurs or gallops Breast:Examined in sitting and supine position were symmetrical in appearance, no palpable masses or tenderness,  no skin retraction, no nipple inversion, no nipple discharge, no skin discoloration, no axillary or supraclavicular lymphadenopathy Abdomen: no palpable masses or tenderness, no rebound or guarding Extremities: no edema or skin discoloration or tenderness  Pelvic: Vulva normal  Bartholin, Urethra, Skene Glands: Within normal limits             Vagina: No gross lesions.  Increased yeasty discharge  Cervix: No gross lesions or discharge.  IUD strings visible.  Pap reflex done.  Uterus  AV, normal size, shape and consistency, non-tender and mobile  Adnexa  Without masses or tenderness  Anus and perineum  normal   Rectovaginal  normal sphincter tone without palpated masses or tenderness    Assessment/Plan:  46 y.o. female for annual exam   1. Encounter for routine gynecological examination with  Papanicolaou smear of cervix Normal gynecologic exam, except for yeast vaginitis..  Pap reflex done.  Breast exam normal.  Last screening mammogram October 2018 benign.  2. IUD contraception Well on Mirena IUD since April 2016.  IUD in good position with strings visible.  3. Yeast vaginitis Yeast vaginitis clinically and on exam.  Will treat with fluconazole 1 tablet per mouth daily for 3 days.  Recommend probiotic tablet vaginally once a week for prevention after treatment as needed.  Other orders  - fluconazole (DIFLUCAN)  150 MG tablet; Take 1 tablet (150 mg total) by mouth daily for 3 days.  Counseling on above issues more than 50% for 10 minutes. Princess Bruins MD, 4:42 PM 10/17/2017

## 2017-10-18 ENCOUNTER — Ambulatory Visit: Payer: BLUE CROSS/BLUE SHIELD | Admitting: Podiatry

## 2017-10-18 ENCOUNTER — Encounter: Payer: Self-pay | Admitting: Podiatry

## 2017-10-18 DIAGNOSIS — M7741 Metatarsalgia, right foot: Secondary | ICD-10-CM | POA: Diagnosis not present

## 2017-10-18 DIAGNOSIS — M7742 Metatarsalgia, left foot: Secondary | ICD-10-CM | POA: Diagnosis not present

## 2017-10-18 DIAGNOSIS — R87610 Atypical squamous cells of undetermined significance on cytologic smear of cervix (ASC-US): Secondary | ICD-10-CM | POA: Diagnosis not present

## 2017-10-18 DIAGNOSIS — Z01419 Encounter for gynecological examination (general) (routine) without abnormal findings: Secondary | ICD-10-CM | POA: Diagnosis not present

## 2017-10-21 ENCOUNTER — Encounter: Payer: Self-pay | Admitting: Obstetrics & Gynecology

## 2017-10-21 NOTE — Patient Instructions (Signed)
1. Encounter for routine gynecological examination with Papanicolaou smear of cervix Normal gynecologic exam, except for yeast vaginitis..  Pap reflex done.  Breast exam normal.  Last screening mammogram October 2018 benign.  2. IUD contraception Well on Mirena IUD since April 2016.  IUD in good position with strings visible.  3. Yeast vaginitis Yeast vaginitis clinically and on exam.  Will treat with fluconazole 1 tablet per mouth daily for 3 days.  Recommend probiotic tablet vaginally once a week for prevention after treatment as needed.  Other orders  - fluconazole (DIFLUCAN) 150 MG tablet; Take 1 tablet (150 mg total) by mouth daily for 3 days.  Brandy Maynard, it was a pleasure meeting you today!  I will inform you of your results as soon as available.  Health Maintenance, Female Adopting a healthy lifestyle and getting preventive care can go a long way to promote health and wellness. Talk with your health care provider about what schedule of regular examinations is right for you. This is a good chance for you to check in with your provider about disease prevention and staying healthy. In between checkups, there are plenty of things you can do on your own. Experts have done a lot of research about which lifestyle changes and preventive measures are most likely to keep you healthy. Ask your health care provider for more information. Weight and diet Eat a healthy diet  Be sure to include plenty of vegetables, fruits, low-fat dairy products, and lean protein.  Do not eat a lot of foods high in solid fats, added sugars, or salt.  Get regular exercise. This is one of the most important things you can do for your health. ? Most adults should exercise for at least 150 minutes each week. The exercise should increase your heart rate and make you sweat (moderate-intensity exercise). ? Most adults should also do strengthening exercises at least twice a week. This is in addition to the moderate-intensity  exercise.  Maintain a healthy weight  Body mass index (BMI) is a measurement that can be used to identify possible weight problems. It estimates body fat based on height and weight. Your health care provider can help determine your BMI and help you achieve or maintain a healthy weight.  For females 45 years of age and older: ? A BMI below 18.5 is considered underweight. ? A BMI of 18.5 to 24.9 is normal. ? A BMI of 25 to 29.9 is considered overweight. ? A BMI of 30 and above is considered obese.  Watch levels of cholesterol and blood lipids  You should start having your blood tested for lipids and cholesterol at 48 years of age, then have this test every 5 years.  You may need to have your cholesterol levels checked more often if: ? Your lipid or cholesterol levels are high. ? You are older than 48 years of age. ? You are at high risk for heart disease.  Cancer screening Lung Cancer  Lung cancer screening is recommended for adults 34-58 years old who are at high risk for lung cancer because of a history of smoking.  A yearly low-dose CT scan of the lungs is recommended for people who: ? Currently smoke. ? Have quit within the past 15 years. ? Have at least a 30-pack-year history of smoking. A pack year is smoking an average of one pack of cigarettes a day for 1 year.  Yearly screening should continue until it has been 15 years since you quit.  Yearly screening should  stop if you develop a health problem that would prevent you from having lung cancer treatment.  Breast Cancer  Practice breast self-awareness. This means understanding how your breasts normally appear and feel.  It also means doing regular breast self-exams. Let your health care provider know about any changes, no matter how small.  If you are in your 20s or 30s, you should have a clinical breast exam (CBE) by a health care provider every 1-3 years as part of a regular health exam.  If you are 39 or older, have  a CBE every year. Also consider having a breast X-ray (mammogram) every year.  If you have a family history of breast cancer, talk to your health care provider about genetic screening.  If you are at high risk for breast cancer, talk to your health care provider about having an MRI and a mammogram every year.  Breast cancer gene (BRCA) assessment is recommended for women who have family members with BRCA-related cancers. BRCA-related cancers include: ? Breast. ? Ovarian. ? Tubal. ? Peritoneal cancers.  Results of the assessment will determine the need for genetic counseling and BRCA1 and BRCA2 testing.  Cervical Cancer Your health care provider may recommend that you be screened regularly for cancer of the pelvic organs (ovaries, uterus, and vagina). This screening involves a pelvic examination, including checking for microscopic changes to the surface of your cervix (Pap test). You may be encouraged to have this screening done every 3 years, beginning at age 88.  For women ages 89-65, health care providers may recommend pelvic exams and Pap testing every 3 years, or they may recommend the Pap and pelvic exam, combined with testing for human papilloma virus (HPV), every 5 years. Some types of HPV increase your risk of cervical cancer. Testing for HPV may also be done on women of any age with unclear Pap test results.  Other health care providers may not recommend any screening for nonpregnant women who are considered low risk for pelvic cancer and who do not have symptoms. Ask your health care provider if a screening pelvic exam is right for you.  If you have had past treatment for cervical cancer or a condition that could lead to cancer, you need Pap tests and screening for cancer for at least 20 years after your treatment. If Pap tests have been discontinued, your risk factors (such as having a new sexual partner) need to be reassessed to determine if screening should resume. Some women have  medical problems that increase the chance of getting cervical cancer. In these cases, your health care provider may recommend more frequent screening and Pap tests.  Colorectal Cancer  This type of cancer can be detected and often prevented.  Routine colorectal cancer screening usually begins at 48 years of age and continues through 48 years of age.  Your health care provider may recommend screening at an earlier age if you have risk factors for colon cancer.  Your health care provider may also recommend using home test kits to check for hidden blood in the stool.  A small camera at the end of a tube can be used to examine your colon directly (sigmoidoscopy or colonoscopy). This is done to check for the earliest forms of colorectal cancer.  Routine screening usually begins at age 74.  Direct examination of the colon should be repeated every 5-10 years through 48 years of age. However, you may need to be screened more often if early forms of precancerous polyps or small  growths are found.  Skin Cancer  Check your skin from head to toe regularly.  Tell your health care provider about any new moles or changes in moles, especially if there is a change in a mole's shape or color.  Also tell your health care provider if you have a mole that is larger than the size of a pencil eraser.  Always use sunscreen. Apply sunscreen liberally and repeatedly throughout the day.  Protect yourself by wearing long sleeves, pants, a wide-brimmed hat, and sunglasses whenever you are outside.  Heart disease, diabetes, and high blood pressure  High blood pressure causes heart disease and increases the risk of stroke. High blood pressure is more likely to develop in: ? People who have blood pressure in the high end of the normal range (130-139/85-89 mm Hg). ? People who are overweight or obese. ? People who are African American.  If you are 22-56 years of age, have your blood pressure checked every 3-5  years. If you are 37 years of age or older, have your blood pressure checked every year. You should have your blood pressure measured twice-once when you are at a hospital or clinic, and once when you are not at a hospital or clinic. Record the average of the two measurements. To check your blood pressure when you are not at a hospital or clinic, you can use: ? An automated blood pressure machine at a pharmacy. ? A home blood pressure monitor.  If you are between 15 years and 35 years old, ask your health care provider if you should take aspirin to prevent strokes.  Have regular diabetes screenings. This involves taking a blood sample to check your fasting blood sugar level. ? If you are at a normal weight and have a low risk for diabetes, have this test once every three years after 48 years of age. ? If you are overweight and have a high risk for diabetes, consider being tested at a younger age or more often. Preventing infection Hepatitis B  If you have a higher risk for hepatitis B, you should be screened for this virus. You are considered at high risk for hepatitis B if: ? You were born in a country where hepatitis B is common. Ask your health care provider which countries are considered high risk. ? Your parents were born in a high-risk country, and you have not been immunized against hepatitis B (hepatitis B vaccine). ? You have HIV or AIDS. ? You use needles to inject street drugs. ? You live with someone who has hepatitis B. ? You have had sex with someone who has hepatitis B. ? You get hemodialysis treatment. ? You take certain medicines for conditions, including cancer, organ transplantation, and autoimmune conditions.  Hepatitis C  Blood testing is recommended for: ? Everyone born from [birth year] through 1967. ? Anyone with known risk factors for hepatitis C.  Sexually transmitted infections (STIs)  You should be screened for sexually transmitted infections (STIs) including  gonorrhea and chlamydia if: ? You are sexually active and are younger than 48 years of age. ? You are older than 48 years of age and your health care provider tells you that you are at risk for this type of infection. ? Your sexual activity has changed since you were last screened and you are at an increased risk for chlamydia or gonorrhea. Ask your health care provider if you are at risk.  If you do not have HIV, but are at risk, it may  be recommended that you take a prescription medicine daily to prevent HIV infection. This is called pre-exposure prophylaxis (PrEP). You are considered at risk if: ? You are sexually active and do not regularly use condoms or know the HIV status of your partner(s). ? You take drugs by injection. ? You are sexually active with a partner who has HIV.  Talk with your health care provider about whether you are at high risk of being infected with HIV. If you choose to begin PrEP, you should first be tested for HIV. You should then be tested every 3 months for as long as you are taking PrEP. Pregnancy  If you are premenopausal and you may become pregnant, ask your health care provider about preconception counseling.  If you may become pregnant, take 400 to 800 micrograms (mcg) of folic acid every day.  If you want to prevent pregnancy, talk to your health care provider about birth control (contraception). Osteoporosis and menopause  Osteoporosis is a disease in which the bones lose minerals and strength with aging. This can result in serious bone fractures. Your risk for osteoporosis can be identified using a bone density scan.  If you are 56 years of age or older, or if you are at risk for osteoporosis and fractures, ask your health care provider if you should be screened.  Ask your health care provider whether you should take a calcium or vitamin D supplement to lower your risk for osteoporosis.  Menopause may have certain physical symptoms and  risks.  Hormone replacement therapy may reduce some of these symptoms and risks. Talk to your health care provider about whether hormone replacement therapy is right for you. Follow these instructions at home:  Schedule regular health, dental, and eye exams.  Stay current with your immunizations.  Do not use any tobacco products including cigarettes, chewing tobacco, or electronic cigarettes.  If you are pregnant, do not drink alcohol.  If you are breastfeeding, limit how much and how often you drink alcohol.  Limit alcohol intake to no more than 1 drink per day for nonpregnant women. One drink equals 12 ounces of beer, 5 ounces of wine, or 1 ounces of hard liquor.  Do not use street drugs.  Do not share needles.  Ask your health care provider for help if you need support or information about quitting drugs.  Tell your health care provider if you often feel depressed.  Tell your health care provider if you have ever been abused or do not feel safe at home. This information is not intended to replace advice given to you by your health care provider. Make sure you discuss any questions you have with your health care provider. Document Released: 05/01/2011 Document Revised: 03/23/2016 Document Reviewed: 07/20/2015 Elsevier Interactive Patient Education  [employer].

## 2017-10-26 LAB — PAP IG W/ RFLX HPV ASCU

## 2017-10-26 LAB — HUMAN PAPILLOMAVIRUS, HIGH RISK: HPV DNA HIGH RISK: NOT DETECTED

## 2017-10-29 DIAGNOSIS — J3489 Other specified disorders of nose and nasal sinuses: Secondary | ICD-10-CM | POA: Diagnosis not present

## 2017-10-29 DIAGNOSIS — J310 Chronic rhinitis: Secondary | ICD-10-CM | POA: Diagnosis not present

## 2017-11-01 ENCOUNTER — Other Ambulatory Visit: Payer: Self-pay

## 2017-11-01 NOTE — Telephone Encounter (Signed)
Former patient of Dr. Moshe Salisbury. Saw you in December for her annual exam. Last year at annual exam. Dr. Moshe Salisbury wrote about Aldactone "also a prescription refill for Aldactone 25 mg which she takes one by mouth daily when necessary." One of the diagnoses for the visit was "fluid retention".

## 2017-11-07 NOTE — Telephone Encounter (Signed)
We didn't discuss fluid retention.  I recommend seeing a Family physician to evaluate diagnosis and treatment.

## 2017-11-08 MED ORDER — SPIRONOLACTONE 25 MG PO TABS
25.0000 mg | ORAL_TABLET | Freq: Every day | ORAL | 0 refills | Status: DC
Start: 2017-11-08 — End: 2018-06-07

## 2017-11-08 NOTE — Telephone Encounter (Signed)
I called patient and relayed this to her. She said to let you know she only takes this with her period and only uses 2-3 a month because she gets so bloated with her period. She said a prescription will last her 4-5 mos.   S

## 2017-11-08 NOTE — Telephone Encounter (Signed)
Spoke with patient and let her know that Dr. Dellis Filbert refilled her Rx.

## 2018-01-01 DIAGNOSIS — D1800 Hemangioma unspecified site: Secondary | ICD-10-CM | POA: Diagnosis not present

## 2018-01-01 DIAGNOSIS — D225 Melanocytic nevi of trunk: Secondary | ICD-10-CM | POA: Diagnosis not present

## 2018-01-01 DIAGNOSIS — Z86018 Personal history of other benign neoplasm: Secondary | ICD-10-CM | POA: Diagnosis not present

## 2018-01-01 DIAGNOSIS — L814 Other melanin hyperpigmentation: Secondary | ICD-10-CM | POA: Diagnosis not present

## 2018-06-07 ENCOUNTER — Encounter: Payer: Self-pay | Admitting: Internal Medicine

## 2018-06-07 ENCOUNTER — Ambulatory Visit: Payer: BLUE CROSS/BLUE SHIELD | Admitting: Internal Medicine

## 2018-06-07 VITALS — BP 108/78 | HR 80 | Temp 98.0°F | Wt 217.4 lb

## 2018-06-07 DIAGNOSIS — F419 Anxiety disorder, unspecified: Secondary | ICD-10-CM

## 2018-06-07 DIAGNOSIS — R61 Generalized hyperhidrosis: Secondary | ICD-10-CM

## 2018-06-07 DIAGNOSIS — R635 Abnormal weight gain: Secondary | ICD-10-CM | POA: Diagnosis not present

## 2018-06-07 DIAGNOSIS — R739 Hyperglycemia, unspecified: Secondary | ICD-10-CM | POA: Diagnosis not present

## 2018-06-07 LAB — CBC WITH DIFFERENTIAL/PLATELET
BASOS ABS: 0 10*3/uL (ref 0.0–0.1)
Basophils Relative: 0.4 % (ref 0.0–3.0)
EOS ABS: 0.1 10*3/uL (ref 0.0–0.7)
Eosinophils Relative: 1.3 % (ref 0.0–5.0)
HCT: 41.8 % (ref 36.0–46.0)
Hemoglobin: 14.1 g/dL (ref 12.0–15.0)
LYMPHS ABS: 1.8 10*3/uL (ref 0.7–4.0)
Lymphocytes Relative: 21.3 % (ref 12.0–46.0)
MCHC: 33.8 g/dL (ref 30.0–36.0)
MCV: 89.5 fl (ref 78.0–100.0)
Monocytes Absolute: 0.5 10*3/uL (ref 0.1–1.0)
Monocytes Relative: 6.1 % (ref 3.0–12.0)
NEUTROS ABS: 6.1 10*3/uL (ref 1.4–7.7)
NEUTROS PCT: 70.9 % (ref 43.0–77.0)
PLATELETS: 216 10*3/uL (ref 150.0–400.0)
RBC: 4.67 Mil/uL (ref 3.87–5.11)
RDW: 12.5 % (ref 11.5–15.5)
WBC: 8.6 10*3/uL (ref 4.0–10.5)

## 2018-06-07 LAB — BASIC METABOLIC PANEL
BUN: 16 mg/dL (ref 6–23)
CO2: 27 mEq/L (ref 19–32)
Calcium: 9.4 mg/dL (ref 8.4–10.5)
Chloride: 105 mEq/L (ref 96–112)
Creatinine, Ser: 0.83 mg/dL (ref 0.40–1.20)
GFR: 77.53 mL/min (ref >60.00)
Glucose, Bld: 90 mg/dL (ref 70–99)
POTASSIUM: 4.2 meq/L (ref 3.5–5.1)
SODIUM: 139 meq/L (ref 135–145)

## 2018-06-07 LAB — LIPID PANEL
Cholesterol: 147 mg/dL (ref 0–200)
HDL: 47.7 mg/dL (ref >39.00)
LDL Cholesterol: 85 mg/dL (ref 0–99)
NonHDL: 99.15
TRIGLYCERIDES: 73 mg/dL (ref 0.0–149.0)
Total CHOL/HDL Ratio: 3
VLDL: 14.6 mg/dL (ref 0.0–40.0)

## 2018-06-07 LAB — HEPATIC FUNCTION PANEL
ALBUMIN: 4 g/dL (ref 3.5–5.2)
ALK PHOS: 109 U/L (ref 39–117)
ALT: 13 U/L (ref 0–35)
AST: 14 U/L (ref 0–37)
BILIRUBIN DIRECT: 0.1 mg/dL (ref 0.0–0.3)
Total Bilirubin: 0.5 mg/dL (ref 0.2–1.2)
Total Protein: 6.5 g/dL (ref 6.0–8.3)

## 2018-06-07 LAB — TSH: TSH: 2.57 u[IU]/mL (ref 0.35–4.50)

## 2018-06-07 LAB — HEMOGLOBIN A1C: Hgb A1c MFr Bld: 6.2 % (ref 4.6–6.5)

## 2018-06-07 LAB — FOLLICLE STIMULATING HORMONE: FSH: 6.4 m[IU]/mL

## 2018-06-07 LAB — T3, FREE: T3, Free: 4 pg/mL (ref 2.3–4.2)

## 2018-06-07 LAB — T4, FREE: FREE T4: 0.76 ng/dL (ref 0.60–1.60)

## 2018-06-07 NOTE — Patient Instructions (Addendum)
Continue track     Intake and  Sleep  Also .    Will notify you  of labs when available.    Consider  HRT  Poss  Menopausal sx effecting sleep mood and thus weight.   Fu depending on results .

## 2018-06-07 NOTE — Progress Notes (Signed)
Chief Complaint  Patient presents with  . Weight Gain    Weight gain, pt concerned of blood sugar levels.    HPI: Brandy Maynard 47 y.o. come in for  sda  For concern  about weight and a1c   Gained 10 # since end of may even though wrorks out    [employer]  .   Regular.  And  Still going   Wants to check out if something else is going on    Bought bg  meter  .  99 and 140.  In am  Tracking.    .      Menopausal sx   Night flushes   disturbed  Anxiety recnetly   Driving .   Had iud.  ocass streaks of blood .Marland Kitchen     Sleep pattern  estroven helps some  But     Sweat in 1 am .    Has iud    Last appt  With me was 10 2018  ROS: See pertinent positives and negatives per HPI. Neg sugar beverages    Past Medical History:  Diagnosis Date  . Allergy   . Anemia   . ANEMIA 12/15/2009   Qualifier: Diagnosis of  By: Regis Bill MD, Standley Brooking   . Depression   . GERD (gastroesophageal reflux disease)   . Heavy menses    on ocps  . IBS (irritable bowel syndrome)    by hx and had a neg colonscopy  . Migraines     Family History  Problem Relation Age of Onset  . Arthritis Mother   . Hypertension Mother   . Cancer Mother        MELANOMA  . Hyperlipidemia Mother   . Hypertension Maternal Grandmother   . Dementia Maternal Grandmother   . Hypertension Maternal Grandfather   . Cancer Maternal Grandfather        MELANOMA  . Heart disease Paternal Grandmother   . Diabetes Paternal Grandfather   . Heart disease Paternal Grandfather   . Heart disease Father   . Cancer - Ovarian Other        Mat Great Grandmother    Social History   Socioeconomic History  . Marital status: Married    Spouse name: Not on file  . Number of children: Not on file  . Years of education: Not on file  . Highest education level: Not on file  Occupational History  . Not on file  Social Needs  . Financial resource strain: Not on file  . Food insecurity:    Worry: Not on file    Inability: Not on file    . Transportation needs:    Medical: Not on file    Non-medical: Not on file  Tobacco Use  . Smoking status: Never Smoker  . Smokeless tobacco: Never Used  Substance and Sexual Activity  . Alcohol use: Yes    Alcohol/week: 0.0 standard drinks    Comment: 1-2 DRINKS A MONTH   . Drug use: No  . Sexual activity: Yes    Partners: Male    Birth control/protection: IUD    Comment: 1st intercourse- 65, parnters- 3, married- 78 yrs   Lifestyle  . Physical activity:    Days per week: Not on file    Minutes per session: Not on file  . Stress: Not on file  Relationships  . Social connections:    Talks on phone: Not on file    Gets together: Not on  file    Attends religious service: Not on file    Active member of club or organization: Not on file    Attends meetings of clubs or organizations: Not on file    Relationship status: Not on file  Other Topics Concern  . Not on file  Social History Narrative   Occupation: unemployed   Married   [place] of 3-4    Pet dogs 2   G3P2          Outpatient Medications Prior to Visit  Medication Sig Dispense Refill  . cetirizine (ZYRTEC) 10 MG tablet Take 10 mg by mouth daily.    Marland Kitchen levonorgestrel (MIRENA) 20 MCG/24HR IUD 1 Intra Uterine Device (1 each total) by Intrauterine route once. 1 each 0  . Melatonin 5 MG TABS Take 5 mg by mouth daily.    . Nutritional Supplements (ESTROVEN EXTRA STRENGTH PO) Take 1 tablet by mouth daily.    Marland Kitchen omeprazole (PRILOSEC OTC) 20 MG tablet Take 20 mg by mouth daily.     . fexofenadine (ALLEGRA) 30 MG tablet Take 30 mg by mouth 2 (two) times daily.    Marland Kitchen spironolactone (ALDACTONE) 25 MG tablet Take 1 tablet (25 mg total) by mouth daily. (Patient not taking: Reported on 06/07/2018) 30 tablet 0   No facility-administered medications prior to visit.      EXAM:  BP 108/78 (BP Location: Right Arm, Patient Position: Sitting, Cuff Size: Normal)   Pulse 80   Temp 98 F (36.7 C) (Oral)   Wt 217 lb 6.4 oz (98.6 kg)    BMI 35.63 kg/m   Body mass index is 35.63 kg/m.  GENERAL: vitals reviewed and listed above, alert, oriented, appears well hydrated and in no acute distress looks somewhat tired no toxic nl  HEENT: atraumatic, conjunctiva  clear, no obvious abnormalities on inspection of external nose and ears OP : no lesion edema or exudate  NECK: no obvious masses on inspection palpation  LUNGS: clear to auscultation bilaterally, no wheezes, rales or rhonchi, good air movement CV: HRRR, no clubbing cyanosis or  peripheral edema nl cap refill  Abdomen:  Sof,t normal bowel sounds without hepatosplenomegaly, no guarding rebound or masses no CVA tenderness Skin: normal capillary refill ,turgor , color: No acute rashes ,petechiae or bruising No striae abnormal acne or hair distribution MS: moves all extremities without noticeable focal  abnormality PSYCH: pleasant and cooperative, no obvious depression or anxiety  BP Readings from Last 3 Encounters:  06/07/18 108/78  10/17/17 124/86  08/29/17 112/88    ASSESSMENT AND PLAN:  Discussed the following assessment and plan:  Hyperglycemia - Plan: TSH, T4, free, Lipid panel, Hemoglobin A1c, Hepatic function panel, CBC with Differential/Platelet, Basic metabolic panel, Follicle Stimulating Hormone, T3, free  Night sweats - Plan: TSH, T4, free, Lipid panel, Hemoglobin A1c, Hepatic function panel, CBC with Differential/Platelet, Basic metabolic panel, Follicle Stimulating Hormone, T3, free  Anxiety - Plan: TSH, T4, free, Lipid panel, Hemoglobin A1c, Hepatic function panel, CBC with Differential/Platelet, Basic metabolic panel, Follicle Stimulating Hormone, T3, free  Weight gain - Plan: TSH, T4, free, Lipid panel, Hemoglobin A1c, Hepatic function panel, CBC with Differential/Platelet, Basic metabolic panel, Follicle Stimulating Hormone, T3, free    probably has a prediabetic propensity   And  Seems perimenopausal .  Reports a helpful diet and exercise .   Counseled.  R/o metabolic  No cushingoid features    She may be helped by   hrt and thus sleep and   Thus  weigh t.  No ob osa .  Reviewed safety of metformin  And consideration of there add on meds  Appetite suppression if needed   Metformin  Bu need more info.   cuation with supplemnts as they are unregulated and  Are an unknown usually -Patient advised to return or notify health care team  if  new concerns arise.  Patient Instructions  Continue track     Intake and  Sleep  Also .    Will notify you  of labs when available.    Consider  HRT  Poss  Menopausal sx effecting sleep mood and thus weight.   Fu depending on results .   Standley Brooking. Zion Lint M.D.

## 2018-06-12 DIAGNOSIS — M7712 Lateral epicondylitis, left elbow: Secondary | ICD-10-CM | POA: Diagnosis not present

## 2018-06-13 DIAGNOSIS — M6281 Muscle weakness (generalized): Secondary | ICD-10-CM | POA: Diagnosis not present

## 2018-06-13 DIAGNOSIS — M25522 Pain in left elbow: Secondary | ICD-10-CM | POA: Diagnosis not present

## 2018-06-13 DIAGNOSIS — M7712 Lateral epicondylitis, left elbow: Secondary | ICD-10-CM | POA: Diagnosis not present

## 2018-06-17 ENCOUNTER — Encounter: Payer: Self-pay | Admitting: *Deleted

## 2018-06-25 DIAGNOSIS — M25522 Pain in left elbow: Secondary | ICD-10-CM | POA: Diagnosis not present

## 2018-06-25 DIAGNOSIS — M6281 Muscle weakness (generalized): Secondary | ICD-10-CM | POA: Diagnosis not present

## 2018-06-25 DIAGNOSIS — M7712 Lateral epicondylitis, left elbow: Secondary | ICD-10-CM | POA: Diagnosis not present

## 2018-06-27 DIAGNOSIS — M7712 Lateral epicondylitis, left elbow: Secondary | ICD-10-CM | POA: Diagnosis not present

## 2018-06-27 DIAGNOSIS — M25522 Pain in left elbow: Secondary | ICD-10-CM | POA: Diagnosis not present

## 2018-06-27 DIAGNOSIS — M6281 Muscle weakness (generalized): Secondary | ICD-10-CM | POA: Diagnosis not present

## 2018-07-05 DIAGNOSIS — M25522 Pain in left elbow: Secondary | ICD-10-CM | POA: Diagnosis not present

## 2018-07-05 DIAGNOSIS — M7712 Lateral epicondylitis, left elbow: Secondary | ICD-10-CM | POA: Diagnosis not present

## 2018-07-05 DIAGNOSIS — M6281 Muscle weakness (generalized): Secondary | ICD-10-CM | POA: Diagnosis not present

## 2018-07-08 DIAGNOSIS — M6281 Muscle weakness (generalized): Secondary | ICD-10-CM | POA: Diagnosis not present

## 2018-07-08 DIAGNOSIS — M7712 Lateral epicondylitis, left elbow: Secondary | ICD-10-CM | POA: Diagnosis not present

## 2018-07-08 DIAGNOSIS — M25522 Pain in left elbow: Secondary | ICD-10-CM | POA: Diagnosis not present

## 2018-07-11 DIAGNOSIS — M6281 Muscle weakness (generalized): Secondary | ICD-10-CM | POA: Diagnosis not present

## 2018-07-11 DIAGNOSIS — M25522 Pain in left elbow: Secondary | ICD-10-CM | POA: Diagnosis not present

## 2018-07-11 DIAGNOSIS — M7712 Lateral epicondylitis, left elbow: Secondary | ICD-10-CM | POA: Diagnosis not present

## 2018-07-15 DIAGNOSIS — M7712 Lateral epicondylitis, left elbow: Secondary | ICD-10-CM | POA: Diagnosis not present

## 2018-07-15 DIAGNOSIS — M6281 Muscle weakness (generalized): Secondary | ICD-10-CM | POA: Diagnosis not present

## 2018-07-15 DIAGNOSIS — M25522 Pain in left elbow: Secondary | ICD-10-CM | POA: Diagnosis not present

## 2018-07-17 DIAGNOSIS — M6281 Muscle weakness (generalized): Secondary | ICD-10-CM | POA: Diagnosis not present

## 2018-07-17 DIAGNOSIS — M25522 Pain in left elbow: Secondary | ICD-10-CM | POA: Diagnosis not present

## 2018-07-17 DIAGNOSIS — M7712 Lateral epicondylitis, left elbow: Secondary | ICD-10-CM | POA: Diagnosis not present

## 2018-07-22 DIAGNOSIS — M25522 Pain in left elbow: Secondary | ICD-10-CM | POA: Diagnosis not present

## 2018-07-22 DIAGNOSIS — M7712 Lateral epicondylitis, left elbow: Secondary | ICD-10-CM | POA: Diagnosis not present

## 2018-07-22 DIAGNOSIS — M6281 Muscle weakness (generalized): Secondary | ICD-10-CM | POA: Diagnosis not present

## 2018-07-25 DIAGNOSIS — M7712 Lateral epicondylitis, left elbow: Secondary | ICD-10-CM | POA: Diagnosis not present

## 2018-07-25 DIAGNOSIS — M25522 Pain in left elbow: Secondary | ICD-10-CM | POA: Diagnosis not present

## 2018-07-25 DIAGNOSIS — M6281 Muscle weakness (generalized): Secondary | ICD-10-CM | POA: Diagnosis not present

## 2018-08-01 DIAGNOSIS — M25522 Pain in left elbow: Secondary | ICD-10-CM | POA: Diagnosis not present

## 2018-08-01 DIAGNOSIS — M6281 Muscle weakness (generalized): Secondary | ICD-10-CM | POA: Diagnosis not present

## 2018-08-01 DIAGNOSIS — M7712 Lateral epicondylitis, left elbow: Secondary | ICD-10-CM | POA: Diagnosis not present

## 2018-08-07 DIAGNOSIS — M25522 Pain in left elbow: Secondary | ICD-10-CM | POA: Diagnosis not present

## 2018-08-07 DIAGNOSIS — M6281 Muscle weakness (generalized): Secondary | ICD-10-CM | POA: Diagnosis not present

## 2018-08-07 DIAGNOSIS — M7712 Lateral epicondylitis, left elbow: Secondary | ICD-10-CM | POA: Diagnosis not present

## 2018-08-13 DIAGNOSIS — F4322 Adjustment disorder with anxiety: Secondary | ICD-10-CM | POA: Diagnosis not present

## 2018-08-21 DIAGNOSIS — F4322 Adjustment disorder with anxiety: Secondary | ICD-10-CM | POA: Diagnosis not present

## 2018-08-23 NOTE — Progress Notes (Signed)
Chief Complaint  Patient presents with  . Weight Gain    Review lab results. Pt has concern of weight gain - Since May 2019 has put on 15lbs. Pt is very active working out and is eating healthy.  Sleeping 6-7hrs per night.     HPI: Brandy Maynard 47 y.o. come in for Chronic disease management   Fu weight gain  extremetly  Frustrated   exercising [employer].    moinitoring Had lost 60 pounds   Others same have lost weight  Wants to avoid metformin  ROS: See pertinent positives and negatives per HPI.  Past Medical History:  Diagnosis Date  . Allergy   . Anemia   . ANEMIA 12/15/2009   Qualifier: Diagnosis of  By: Regis Bill MD, Standley Brooking   . Depression   . GERD (gastroesophageal reflux disease)   . Heavy menses    on ocps  . IBS (irritable bowel syndrome)    by hx and had a neg colonscopy  . Migraines     Family History  Problem Relation Age of Onset  . Arthritis Mother   . Hypertension Mother   . Cancer Mother        MELANOMA  . Hyperlipidemia Mother   . Hypertension Maternal Grandmother   . Dementia Maternal Grandmother   . Hypertension Maternal Grandfather   . Cancer Maternal Grandfather        MELANOMA  . Heart disease Paternal Grandmother   . Diabetes Paternal Grandfather   . Heart disease Paternal Grandfather   . Heart disease Father   . Cancer - Ovarian Other        Mat Great Grandmother    Social History   Socioeconomic History  . Marital status: Married    Spouse name: Not on file  . Number of children: Not on file  . Years of education: Not on file  . Highest education level: Not on file  Occupational History  . Not on file  Social Needs  . Financial resource strain: Not on file  . Food insecurity:    Worry: Not on file    Inability: Not on file  . Transportation needs:    Medical: Not on file    Non-medical: Not on file  Tobacco Use  . Smoking status: Never Smoker  . Smokeless tobacco: Never Used  Substance and Sexual Activity  . Alcohol use:  Yes    Alcohol/week: 0.0 standard drinks    Comment: 1-2 DRINKS A MONTH   . Drug use: No  . Sexual activity: Yes    Partners: Male    Birth control/protection: IUD    Comment: 1st intercourse- 33, parnters- 3, married- 53 yrs   Lifestyle  . Physical activity:    Days per week: Not on file    Minutes per session: Not on file  . Stress: Not on file  Relationships  . Social connections:    Talks on phone: Not on file    Gets together: Not on file    Attends religious service: Not on file    Active member of club or organization: Not on file    Attends meetings of clubs or organizations: Not on file    Relationship status: Not on file  Other Topics Concern  . Not on file  Social History Narrative   Occupation: unemployed   Married   [place] of 3-4    Pet dogs 2   G3P2  Outpatient Medications Prior to Visit  Medication Sig Dispense Refill  . cetirizine (ZYRTEC) 10 MG tablet Take 10 mg by mouth daily.    Marland Kitchen levonorgestrel (MIRENA) 20 MCG/24HR IUD 1 Intra Uterine Device (1 each total) by Intrauterine route once. 1 each 0  . Melatonin 5 MG TABS Take 5 mg by mouth daily.    . Nutritional Supplements (ESTROVEN EXTRA STRENGTH PO) Take 1 tablet by mouth daily.    Marland Kitchen omeprazole (PRILOSEC OTC) 20 MG tablet Take 20 mg by mouth daily.      No facility-administered medications prior to visit.      EXAM:  BP 124/86 (BP Location: Right Arm, Patient Position: Sitting, Cuff Size: Normal)   Pulse 79   Temp 98.2 F (36.8 C) (Oral)   Wt 221 lb 3.2 oz (100.3 kg)   BMI 36.25 kg/m   Body mass index is 36.25 kg/m.  GENERAL: vitals reviewed and listed above, alert, oriented, appears well hydrated and in no acute distress PSYCH: pleasant and cooperative, no obvious depression or anxiety Lab Results  Component Value Date   WBC 8.6 06/07/2018   HGB 14.1 06/07/2018   HCT 41.8 06/07/2018   PLT 216.0 06/07/2018   GLUCOSE 90 06/07/2018   CHOL 147 06/07/2018   TRIG 73.0 06/07/2018    HDL 47.70 06/07/2018   LDLCALC 85 06/07/2018   ALT 13 06/07/2018   AST 14 06/07/2018   NA 139 06/07/2018   K 4.2 06/07/2018   CL 105 06/07/2018   CREATININE 0.83 06/07/2018   BUN 16 06/07/2018   CO2 27 06/07/2018   TSH 2.57 06/07/2018   HGBA1C 6.2 06/07/2018   BP Readings from Last 3 Encounters:  08/26/18 124/86  06/07/18 108/78  10/17/17 124/86   Wt Readings from Last 3 Encounters:  08/26/18 221 lb 3.2 oz (100.3 kg)  06/07/18 217 lb 6.4 oz (98.6 kg)  10/17/17 212 lb (96.2 kg)    ASSESSMENT AND PLAN:  Discussed the following assessment and plan:  Weight gain  BMI 35.0-35.9,adult  Hyperglycemia  Medication management  Need for influenza vaccination - Plan: Flu Vaccine QUAD 6+ mos PF IM (Fluarix Quad PF) Perimenopausal   Exercising  Pre diabetic  And  Eating better   Look at shakes  considier other but begin  With   Phentermine  Consider other...  rov in 1 mos  Flu vaccine today ok  Lab results  Reviewed with patient Total visit 43mins > 50% spent counseling and coordinating care as indicated in above note and in instructions to patient .  -Patient advised to return or notify health care team  if  new concerns arise.  Patient Instructions    Adding appetite suppressant  For now and  Decrease portion  size .  Look into  The protein shakes .     ROv in  About a month     Layal Javid K. Ahna Konkle M.D.

## 2018-08-26 ENCOUNTER — Ambulatory Visit: Payer: BLUE CROSS/BLUE SHIELD | Admitting: Internal Medicine

## 2018-08-26 ENCOUNTER — Encounter: Payer: Self-pay | Admitting: Internal Medicine

## 2018-08-26 VITALS — BP 124/86 | HR 79 | Temp 98.2°F | Wt 221.2 lb

## 2018-08-26 DIAGNOSIS — R739 Hyperglycemia, unspecified: Secondary | ICD-10-CM

## 2018-08-26 DIAGNOSIS — R635 Abnormal weight gain: Secondary | ICD-10-CM

## 2018-08-26 DIAGNOSIS — Z79899 Other long term (current) drug therapy: Secondary | ICD-10-CM

## 2018-08-26 DIAGNOSIS — Z23 Encounter for immunization: Secondary | ICD-10-CM

## 2018-08-26 DIAGNOSIS — Z6835 Body mass index (BMI) 35.0-35.9, adult: Secondary | ICD-10-CM | POA: Diagnosis not present

## 2018-08-26 MED ORDER — PHENTERMINE HCL 15 MG PO CAPS: 15.0000 mg | ORAL_CAPSULE | ORAL | 1 refills | Status: DC

## 2018-08-26 NOTE — Patient Instructions (Addendum)
   Adding appetite suppressant  For now and  Decrease portion  size .  Look into  The protein shakes .     ROv in  About a month

## 2018-08-27 ENCOUNTER — Encounter: Payer: Self-pay | Admitting: Obstetrics & Gynecology

## 2018-08-27 DIAGNOSIS — F4322 Adjustment disorder with anxiety: Secondary | ICD-10-CM | POA: Diagnosis not present

## 2018-08-27 DIAGNOSIS — Z1231 Encounter for screening mammogram for malignant neoplasm of breast: Secondary | ICD-10-CM | POA: Diagnosis not present

## 2018-09-10 DIAGNOSIS — F4322 Adjustment disorder with anxiety: Secondary | ICD-10-CM | POA: Diagnosis not present

## 2018-09-18 DIAGNOSIS — F4322 Adjustment disorder with anxiety: Secondary | ICD-10-CM | POA: Diagnosis not present

## 2018-09-24 NOTE — Progress Notes (Signed)
Chief Complaint  Patient presents with  . Weight Gain    Pt put on Phentermine last OV - tolerating well. Little Dry mouth and constipation. Denies HA or Palps.     HPI: Brandy Maynard 47 y.o. come in for med check began on phentermine    Made hyper aware  Of snacking  And has done less  Emotional eating has dec and more self reflective . And has better mood  Continues  Exercises  Some constipation and dry mouth   chanign diet for this  But tolerable medication and feels positive . 6-7 hours  Sleep.  Working on it.  ROS: See pertinent positives and negatives per HPI.  Past Medical History:  Diagnosis Date  . Allergy   . Anemia   . ANEMIA 12/15/2009   Qualifier: Diagnosis of  By: Regis Bill MD, Standley Brooking   . Depression   . GERD (gastroesophageal reflux disease)   . Heavy menses    on ocps  . IBS (irritable bowel syndrome)    by hx and had a neg colonscopy  . Migraines     Family History  Problem Relation Age of Onset  . Arthritis Mother   . Hypertension Mother   . Cancer Mother        MELANOMA  . Hyperlipidemia Mother   . Hypertension Maternal Grandmother   . Dementia Maternal Grandmother   . Hypertension Maternal Grandfather   . Cancer Maternal Grandfather        MELANOMA  . Heart disease Paternal Grandmother   . Diabetes Paternal Grandfather   . Heart disease Paternal Grandfather   . Heart disease Father   . Cancer - Ovarian Other        Mat Great Grandmother    Social History   Socioeconomic History  . Marital status: Married    Spouse name: Not on file  . Number of children: Not on file  . Years of education: Not on file  . Highest education level: Not on file  Occupational History  . Not on file  Social Needs  . Financial resource strain: Not on file  . Food insecurity:    Worry: Not on file    Inability: Not on file  . Transportation needs:    Medical: Not on file    Non-medical: Not on file  Tobacco Use  . Smoking status: Never Smoker  .  Smokeless tobacco: Never Used  Substance and Sexual Activity  . Alcohol use: Yes    Alcohol/week: 0.0 standard drinks    Comment: 1-2 DRINKS A MONTH   . Drug use: No  . Sexual activity: Yes    Partners: Male    Birth control/protection: IUD    Comment: 1st intercourse- 48, parnters- 3, married- 20 yrs   Lifestyle  . Physical activity:    Days per week: Not on file    Minutes per session: Not on file  . Stress: Not on file  Relationships  . Social connections:    Talks on phone: Not on file    Gets together: Not on file    Attends religious service: Not on file    Active member of club or organization: Not on file    Attends meetings of clubs or organizations: Not on file    Relationship status: Not on file  Other Topics Concern  . Not on file  Social History Narrative   Occupation: unemployed   Married   [place] of 3-4  Pet dogs 2   G3P2          Outpatient Medications Prior to Visit  Medication Sig Dispense Refill  . cetirizine (ZYRTEC) 10 MG tablet Take 10 mg by mouth daily.    Marland Kitchen levonorgestrel (MIRENA) 20 MCG/24HR IUD 1 Intra Uterine Device (1 each total) by Intrauterine route once. 1 each 0  . Melatonin 5 MG TABS Take 5 mg by mouth daily.    . Nutritional Supplements (ESTROVEN EXTRA STRENGTH PO) Take 1 tablet by mouth daily.    Marland Kitchen omeprazole (PRILOSEC OTC) 20 MG tablet Take 20 mg by mouth daily.     . phentermine 15 MG capsule Take 1 capsule (15 mg total) by mouth every morning. 30 capsule 1   No facility-administered medications prior to visit.      EXAM:  BP 108/80 (BP Location: Right Arm, Patient Position: Sitting, Cuff Size: Large)   Pulse 100   Temp 98.5 F (36.9 C) (Oral)   Wt 212 lb 4.8 oz (96.3 kg)   BMI 34.79 kg/m   Body mass index is 34.79 kg/m.  GENERAL: vitals reviewed and listed above, alert, oriented, appears well hydrated and in no acute distress PSYCH: pleasant and cooperative, no obvious depression or anxiety Lab Results  Component  Value Date   WBC 8.6 06/07/2018   HGB 14.1 06/07/2018   HCT 41.8 06/07/2018   PLT 216.0 06/07/2018   GLUCOSE 90 06/07/2018   CHOL 147 06/07/2018   TRIG 73.0 06/07/2018   HDL 47.70 06/07/2018   LDLCALC 85 06/07/2018   ALT 13 06/07/2018   AST 14 06/07/2018   NA 139 06/07/2018   K 4.2 06/07/2018   CL 105 06/07/2018   CREATININE 0.83 06/07/2018   BUN 16 06/07/2018   CO2 27 06/07/2018   TSH 2.57 06/07/2018   HGBA1C 6.2 06/07/2018   BP Readings from Last 3 Encounters:  09/25/18 108/80  08/26/18 124/86  06/07/18 108/78   Wt Readings from Last 3 Encounters:  09/25/18 212 lb 4.8 oz (96.3 kg)  08/26/18 221 lb 3.2 oz (100.3 kg)  06/07/18 217 lb 6.4 oz (98.6 kg)    ASSESSMENT AND PLAN:  Discussed the following assessment and plan:  BMI 34.0-34.9,adult  Medication management - 9 # weight loss  in one month Good response  To intervention  benefit more than risk at this time  No intensify of dose   And refill to get through to  6-8 weeks fu  Med January . Total visit 21mins > 50% spent counseling and coordinating care as indicated in above note and in instructions to patient .  -Patient advised to return or notify health care team  if  new concerns arise.  Patient Instructions  Ok to stay on meds at this time .    rov in 6-8 weeks         Tierre Gerard K. Bailea Beed M.D.

## 2018-09-25 ENCOUNTER — Ambulatory Visit: Payer: BLUE CROSS/BLUE SHIELD | Admitting: Internal Medicine

## 2018-09-25 ENCOUNTER — Encounter: Payer: Self-pay | Admitting: Internal Medicine

## 2018-09-25 VITALS — BP 108/80 | HR 100 | Temp 98.5°F | Wt 212.3 lb

## 2018-09-25 DIAGNOSIS — Z6834 Body mass index (BMI) 34.0-34.9, adult: Secondary | ICD-10-CM

## 2018-09-25 DIAGNOSIS — Z79899 Other long term (current) drug therapy: Secondary | ICD-10-CM

## 2018-09-25 MED ORDER — PHENTERMINE HCL 15 MG PO CAPS: 15.0000 mg | ORAL_CAPSULE | ORAL | 0 refills | Status: DC

## 2018-09-25 NOTE — Patient Instructions (Addendum)
Ok to stay on meds at this time .    rov in 6-8 weeks

## 2018-10-03 DIAGNOSIS — F4322 Adjustment disorder with anxiety: Secondary | ICD-10-CM | POA: Diagnosis not present

## 2018-10-08 DIAGNOSIS — F4322 Adjustment disorder with anxiety: Secondary | ICD-10-CM | POA: Diagnosis not present

## 2018-10-15 DIAGNOSIS — F4322 Adjustment disorder with anxiety: Secondary | ICD-10-CM | POA: Diagnosis not present

## 2018-10-19 ENCOUNTER — Other Ambulatory Visit: Payer: Self-pay | Admitting: Internal Medicine

## 2018-10-24 NOTE — Telephone Encounter (Signed)
Okay to fill? Last OV:09/25/18 Last filled:09/25/18

## 2018-10-25 NOTE — Telephone Encounter (Signed)
Sent in electronically .  

## 2018-10-31 DIAGNOSIS — F4322 Adjustment disorder with anxiety: Secondary | ICD-10-CM | POA: Diagnosis not present

## 2018-11-06 DIAGNOSIS — F4322 Adjustment disorder with anxiety: Secondary | ICD-10-CM | POA: Diagnosis not present

## 2018-11-11 NOTE — Progress Notes (Signed)
Chief Complaint  Patient presents with  . Follow-up    Pt states that she feels like the phentermine isn't working as well when she first got on it     HPI: Brandy Maynard 48 y.o. come in for Chronic disease medicationanagement  Last seen nov 27 for weight management     Holidays some sugar addition. Feeling and working on it .   But didn't gain weight  Satiety issue  .   Husband changed jobs  Doing ok soe stress   Left back spasm after gyme work pout   abd pain and bloating.   iud  To see gyne  This week .   ROS: See pertinent positives and negatives per HPI.  Past Medical History:  Diagnosis Date  . Allergy   . Anemia   . ANEMIA 12/15/2009   Qualifier: Diagnosis of  By: Regis Bill MD, Standley Brooking   . Depression   . GERD (gastroesophageal reflux disease)   . Heavy menses    on ocps  . IBS (irritable bowel syndrome)    by hx and had a neg colonscopy  . Migraines     Family History  Problem Relation Age of Onset  . Arthritis Mother   . Hypertension Mother   . Cancer Mother        MELANOMA  . Hyperlipidemia Mother   . Hypertension Maternal Grandmother   . Dementia Maternal Grandmother   . Hypertension Maternal Grandfather   . Cancer Maternal Grandfather        MELANOMA  . Heart disease Paternal Grandmother   . Diabetes Paternal Grandfather   . Heart disease Paternal Grandfather   . Heart disease Father   . Cancer - Ovarian Other        Mat Great Grandmother    Social History   Socioeconomic History  . Marital status: Married    Spouse name: Not on file  . Number of children: Not on file  . Years of education: Not on file  . Highest education level: Not on file  Occupational History  . Not on file  Social Needs  . Financial resource strain: Not on file  . Food insecurity:    Worry: Not on file    Inability: Not on file  . Transportation needs:    Medical: Not on file    Non-medical: Not on file  Tobacco Use  . Smoking status: Never Smoker  . Smokeless  tobacco: Never Used  Substance and Sexual Activity  . Alcohol use: Yes    Alcohol/week: 0.0 standard drinks    Comment: 1-2 DRINKS A MONTH   . Drug use: No  . Sexual activity: Yes    Partners: Male    Birth control/protection: I.U.D.    Comment: 1st intercourse- 40, parnters- 3, married- 23 yrs   Lifestyle  . Physical activity:    Days per week: Not on file    Minutes per session: Not on file  . Stress: Not on file  Relationships  . Social connections:    Talks on phone: Not on file    Gets together: Not on file    Attends religious service: Not on file    Active member of club or organization: Not on file    Attends meetings of clubs or organizations: Not on file    Relationship status: Not on file  Other Topics Concern  . Not on file  Social History Narrative   Occupation: unemployed   Married  [place] of 3-4    Pet dogs 2   G3P2          Outpatient Medications Prior to Visit  Medication Sig Dispense Refill  . cetirizine (ZYRTEC) 10 MG tablet Take 10 mg by mouth daily.    Marland Kitchen levonorgestrel (MIRENA) 20 MCG/24HR IUD 1 Intra Uterine Device (1 each total) by Intrauterine route once. 1 each 0  . Melatonin 5 MG TABS Take 5 mg by mouth daily.    . Nutritional Supplements (ESTROVEN EXTRA STRENGTH PO) Take 1 tablet by mouth daily.    Marland Kitchen omeprazole (PRILOSEC OTC) 20 MG tablet Take 20 mg by mouth daily.     . phentermine 15 MG capsule TAKE 1 CAPSULE(15 MG) BY MOUTH EVERY MORNING 30 capsule 0   No facility-administered medications prior to visit.      EXAM:  BP 126/82 (BP Location: Right Arm, Patient Position: Sitting, Cuff Size: Large)   Pulse 82   Temp 98.3 F (36.8 C) (Oral)   Ht 5\' 6"  (1.676 m)   Wt 212 lb 9.6 oz (96.4 kg)   BMI 34.31 kg/m   Body mass index is 34.31 kg/m.  GENERAL: vitals reviewed and listed above, alert, oriented, appears well hydrated and in no acute distress HEENT: atraumatic, conjunctiva  clear, no obvious abnormalities on inspection of external  nose and ears  NECK: no obvious masses on inspection palpation  No midline back pain  Left flank  Spasm but non focal gait .  LMS: moves all extremities without noticeable focal  abnormality PSYCH: pleasant and cooperative, no obvious depression or anxiety Lab Results  Component Value Date   WBC 8.6 06/07/2018   HGB 14.1 06/07/2018   HCT 41.8 06/07/2018   PLT 216.0 06/07/2018   GLUCOSE 90 06/07/2018   CHOL 147 06/07/2018   TRIG 73.0 06/07/2018   HDL 47.70 06/07/2018   LDLCALC 85 06/07/2018   ALT 13 06/07/2018   AST 14 06/07/2018   NA 139 06/07/2018   K 4.2 06/07/2018   CL 105 06/07/2018   CREATININE 0.83 06/07/2018   BUN 16 06/07/2018   CO2 27 06/07/2018   TSH 2.57 06/07/2018   HGBA1C 6.2 06/07/2018   BP Readings from Last 3 Encounters:  11/12/18 126/82  09/25/18 108/80  08/26/18 124/86   Wt Readings from Last 3 Encounters:  11/12/18 212 lb 9.6 oz (96.4 kg)  09/25/18 212 lb 4.8 oz (96.3 kg)  08/26/18 221 lb 3.2 oz (100.3 kg)    ASSESSMENT AND PLAN:  Discussed the following assessment and plan:  Medication management  BMI 34.0-34.9,adult  Back spasm Disc inc dose of med and lsi  But wants to avoid constipation   Disc back hygiene  physical modalities    She can contact us if wishes to try inc dosing .  -Patient advised to return or notify health care team  if  new concerns arise.  Patient Instructions  Consideration of increasing dose to 30 or 37.5  ..with  Increasing   Fiber and consider .   miralax also .   Back  Hygiene .   ROV  in 6-8 weeks or   As we decide.     Standley Brooking.  M.D.

## 2018-11-12 ENCOUNTER — Encounter: Payer: Self-pay | Admitting: Internal Medicine

## 2018-11-12 ENCOUNTER — Ambulatory Visit (INDEPENDENT_AMBULATORY_CARE_PROVIDER_SITE_OTHER): Payer: BLUE CROSS/BLUE SHIELD | Admitting: Internal Medicine

## 2018-11-12 VITALS — BP 126/82 | HR 82 | Temp 98.3°F | Ht 66.0 in | Wt 212.6 lb

## 2018-11-12 DIAGNOSIS — Z6834 Body mass index (BMI) 34.0-34.9, adult: Secondary | ICD-10-CM

## 2018-11-12 DIAGNOSIS — Z79899 Other long term (current) drug therapy: Secondary | ICD-10-CM | POA: Diagnosis not present

## 2018-11-12 DIAGNOSIS — M62830 Muscle spasm of back: Secondary | ICD-10-CM | POA: Diagnosis not present

## 2018-11-12 NOTE — Patient Instructions (Addendum)
Consideration of increasing dose to 30 or 37.5  ..with  Increasing   Fiber and consider .   miralax also .   Back  Hygiene .   ROV  in 6-8 weeks or   As we decide.

## 2018-11-13 DIAGNOSIS — F4322 Adjustment disorder with anxiety: Secondary | ICD-10-CM | POA: Diagnosis not present

## 2018-11-15 ENCOUNTER — Encounter: Payer: Self-pay | Admitting: Obstetrics & Gynecology

## 2018-11-15 ENCOUNTER — Ambulatory Visit: Payer: BLUE CROSS/BLUE SHIELD | Admitting: Obstetrics & Gynecology

## 2018-11-15 VITALS — BP 132/90

## 2018-11-15 DIAGNOSIS — N951 Menopausal and female climacteric states: Secondary | ICD-10-CM

## 2018-11-15 DIAGNOSIS — R102 Pelvic and perineal pain: Secondary | ICD-10-CM | POA: Diagnosis not present

## 2018-11-15 DIAGNOSIS — Z30432 Encounter for removal of intrauterine contraceptive device: Secondary | ICD-10-CM

## 2018-11-15 NOTE — Progress Notes (Signed)
    LAURE LEONE 02-May-1971 [id number]        48 y.o.  [id number]   RP: Severe pelvic cramping intermittently  HPI: Mirena IUD for cycle control since 2016.  Status post tubal sterilization.  Complains of pelvic cramping worsening recently and severe especially after intercourse.  Hot flushes and night sweats since 12/2017, improved on Estroven.     OB History  Gravida Para Term Preterm AB Living  3 2 2   1 2   SAB TAB Ectopic Multiple Live Births  1       2    # Outcome Date GA Lbr Len/2nd Weight Sex Delivery Anes PTL Lv  3 SAB           2 Term     F Vag-Spont  N LIV  1 Term     F Vag-Spont  N LIV    Past medical history,surgical history, problem list, medications, allergies, family history and social history were all reviewed and documented in the EPIC chart.   Directed ROS with pertinent positives and negatives documented in the history of present illness/assessment and plan.  Exam:  Vitals:   11/15/18 1108  BP: 132/90   General appearance:  Normal  Abdomen: Normal  Gynecologic exam: Vulva normal.  Speculum: Cervix and vagina normal.  IUD strings visible at the exocervix.  Normal vaginal secretions and no bleeding.  Verbal consent obtained to remove the IUD.  IUD strings grasped with a clamp and easy removal of the IUD.  IUD intact and complete, shown to patient and discarded.  Removal procedure well-tolerated by the patient without any complication.  Bimanual exam: Uterus anteverted, normal volume, mobile, nontender.  No adnexal mass felt, nontender bilaterally.   Assessment/Plan:  48 y.o. [id number]   1. Pelvic pain in female Painful uterine cramps possibly associated with Mirena IUD.  Normal Gynecologic exam.  Decision to remove the IUD given that patient is probably entering menopause and is s/p Tubal sterilization.  If no improvement in the Pelvic cramps after IUD removal, will call to schedule a Pelvic US.  2. Hot flushes, perimenopausal Vasomotor menopausal symptoms  improved on Estroven.  Will recheck [facility] today.   - [place]  3. Encounter for IUD removal Easy removal of Mirena IUD.  No Cx.  S/P Tubal Sterilization.    Counseling on above issues and coordination of care >50% x 25 minutes  Princess Bruins MD, 11:27 AM 11/15/2018

## 2018-11-15 NOTE — Patient Instructions (Signed)
1. Pelvic pain in female Painful uterine cramps possibly associated with Mirena IUD.  Normal Gynecologic exam.  Decision to remove the IUD given that patient is probably entering menopause and is s/p Tubal sterilization.  If no improvement in the Pelvic cramps after IUD removal, will call to schedule a Pelvic US.  2. Hot flushes, perimenopausal Vasomotor menopausal symptoms improved on Estroven.  Will recheck [facility] today.   - [place]  3. Encounter for IUD removal Easy removal of Mirena IUD.  No Cx.  S/P Tubal Sterilization.    Brandy Maynard, it was a pleasure seeing you today!  I will inform you of your results as soon as they are available.

## 2018-11-16 LAB — FOLLICLE STIMULATING HORMONE: FSH: 32.2 m[IU]/mL

## 2018-11-25 DIAGNOSIS — M9903 Segmental and somatic dysfunction of lumbar region: Secondary | ICD-10-CM | POA: Diagnosis not present

## 2018-11-25 DIAGNOSIS — M546 Pain in thoracic spine: Secondary | ICD-10-CM | POA: Diagnosis not present

## 2018-11-25 DIAGNOSIS — S233XXA Sprain of ligaments of thoracic spine, initial encounter: Secondary | ICD-10-CM | POA: Diagnosis not present

## 2018-11-27 DIAGNOSIS — M9903 Segmental and somatic dysfunction of lumbar region: Secondary | ICD-10-CM | POA: Diagnosis not present

## 2018-11-27 DIAGNOSIS — S233XXA Sprain of ligaments of thoracic spine, initial encounter: Secondary | ICD-10-CM | POA: Diagnosis not present

## 2018-11-27 DIAGNOSIS — M546 Pain in thoracic spine: Secondary | ICD-10-CM | POA: Diagnosis not present

## 2018-11-29 DIAGNOSIS — M546 Pain in thoracic spine: Secondary | ICD-10-CM | POA: Diagnosis not present

## 2018-11-29 DIAGNOSIS — M9903 Segmental and somatic dysfunction of lumbar region: Secondary | ICD-10-CM | POA: Diagnosis not present

## 2018-11-29 DIAGNOSIS — S233XXA Sprain of ligaments of thoracic spine, initial encounter: Secondary | ICD-10-CM | POA: Diagnosis not present

## 2018-12-05 DIAGNOSIS — F4322 Adjustment disorder with anxiety: Secondary | ICD-10-CM | POA: Diagnosis not present

## 2018-12-06 DIAGNOSIS — S233XXA Sprain of ligaments of thoracic spine, initial encounter: Secondary | ICD-10-CM | POA: Diagnosis not present

## 2018-12-06 DIAGNOSIS — M546 Pain in thoracic spine: Secondary | ICD-10-CM | POA: Diagnosis not present

## 2018-12-06 DIAGNOSIS — M9903 Segmental and somatic dysfunction of lumbar region: Secondary | ICD-10-CM | POA: Diagnosis not present

## 2018-12-31 DIAGNOSIS — Z86018 Personal history of other benign neoplasm: Secondary | ICD-10-CM | POA: Diagnosis not present

## 2018-12-31 DIAGNOSIS — D225 Melanocytic nevi of trunk: Secondary | ICD-10-CM | POA: Diagnosis not present

## 2018-12-31 DIAGNOSIS — L814 Other melanin hyperpigmentation: Secondary | ICD-10-CM | POA: Diagnosis not present

## 2019-01-02 ENCOUNTER — Encounter: Payer: Self-pay | Admitting: Obstetrics & Gynecology

## 2019-01-02 ENCOUNTER — Ambulatory Visit: Payer: BLUE CROSS/BLUE SHIELD | Admitting: Obstetrics & Gynecology

## 2019-01-02 VITALS — BP 124/80 | Ht 64.5 in | Wt 214.0 lb

## 2019-01-02 DIAGNOSIS — Z6836 Body mass index (BMI) 36.0-36.9, adult: Secondary | ICD-10-CM

## 2019-01-02 DIAGNOSIS — E6609 Other obesity due to excess calories: Secondary | ICD-10-CM

## 2019-01-02 DIAGNOSIS — Z01419 Encounter for gynecological examination (general) (routine) without abnormal findings: Secondary | ICD-10-CM

## 2019-01-02 DIAGNOSIS — Z30431 Encounter for routine checking of intrauterine contraceptive device: Secondary | ICD-10-CM

## 2019-01-02 DIAGNOSIS — N951 Menopausal and female climacteric states: Secondary | ICD-10-CM

## 2019-01-02 MED ORDER — PROGESTERONE MICRONIZED 100 MG PO CAPS
100.0000 mg | ORAL_CAPSULE | Freq: Every day | ORAL | 4 refills | Status: DC
Start: 2019-01-02 — End: 2019-12-23

## 2019-01-02 NOTE — Progress Notes (Signed)
Brandy Maynard February 09, 1971 [id number]   History:    48 y.o. [id number] Married  RP:  Established patient presenting for annual gyn exam   HPI:  Perimenopause, oligomenorrhea.  Hot flushes and night sweats on-off helped with Estroven.  LMP normal 12/13/2018.  No pelvic pain.  No pain with IC.  Very frustrated with difficulty loosing weight.  BMI 36.17.  In a [employer].  Restarted Phentermine.  Health labs with Fam MD.    Past medical history,surgical history, family history and social history were all reviewed and documented in the EPIC chart.  Gynecologic History Patient's last menstrual period was 12/13/2018. Contraception: Mirena IUD x 01/2015 Last Pap: 09/2017. Results were: Negative Last mammogram: 07/2018. Results were: Negative Bone Density: Never Colonoscopy: 2017  Obstetric History OB History  Gravida Para Term Preterm AB Living  3 2 2   1 2   SAB TAB Ectopic Multiple Live Births  1       2    # Outcome Date GA Lbr Len/2nd Weight Sex Delivery Anes PTL Lv  3 SAB           2 Term     F Vag-Spont  N LIV  1 Term     F Vag-Spont  N LIV     ROS: A ROS was performed and pertinent positives and negatives are included in the history.  GENERAL: No fevers or chills. HEENT: No change in vision, no earache, sore throat or sinus congestion. NECK: No pain or stiffness. CARDIOVASCULAR: No chest pain or pressure. No palpitations. PULMONARY: No shortness of breath, cough or wheeze. GASTROINTESTINAL: No abdominal pain, nausea, vomiting or diarrhea, melena or bright red blood per rectum. GENITOURINARY: No urinary frequency, urgency, hesitancy or dysuria. MUSCULOSKELETAL: No joint or muscle pain, no back pain, no recent trauma. DERMATOLOGIC: No rash, no itching, no lesions. ENDOCRINE: No polyuria, polydipsia, no heat or cold intolerance. No recent change in weight. HEMATOLOGICAL: No anemia or easy bruising or bleeding. NEUROLOGIC: No headache, seizures, numbness, tingling or weakness.  PSYCHIATRIC: No depression, no loss of interest in normal activity or change in sleep pattern.     Exam:   BP 124/80   Ht 5' 4.5" (1.638 m)   Wt 214 lb (97.1 kg)   LMP 12/13/2018   BMI 36.17 kg/m   Body mass index is 36.17 kg/m.  General appearance : Well developed well nourished female. No acute distress HEENT: Eyes: no retinal hemorrhage or exudates,  Neck supple, trachea midline, no carotid bruits, no thyroidmegaly Lungs: Clear to auscultation, no rhonchi or wheezes, or rib retractions  Heart: Regular rate and rhythm, no murmurs or gallops Breast:Examined in sitting and supine position were symmetrical in appearance, no palpable masses or tenderness,  no skin retraction, no nipple inversion, no nipple discharge, no skin discoloration, no axillary or supraclavicular lymphadenopathy Abdomen: no palpable masses or tenderness, no rebound or guarding Extremities: no edema or skin discoloration or tenderness  Pelvic: Vulva: Normal             Vagina: No gross lesions or discharge  Cervix: No gross lesions or discharge.  Pap reflex done.  IUD strings visible.  Uterus  AV, normal size, shape and consistency, non-tender and mobile  Adnexa  Without masses or tenderness  Anus: Normal   Assessment/Plan:  48 y.o. female for annual exam   1. Encounter for routine gynecological examination with Papanicolaou smear of cervix Normal gynecologic exam.  Pap reflex done.  Breast exam normal.  Last mammogram October 2019 was negative.  Colonoscopy in 2017.  Health labs with family physician.  2. Encounter for routine checking of intrauterine contraceptive device (IUD) Mirena IUD to be in good position and well-tolerated since April 2016.  3. Perimenopause Perimenopause with oligomenorrhea and hot flushes.  Mild help with Estroven over-the-counter.  Decision to start with Prometrium 100 mg daily at bedtime.  Usage reviewed and prescription sent to pharmacy.  4. Class 2 obesity due to excess  calories without serious comorbidity with body mass index (BMI) of 36.0 to 36.9 in adult Recommend lower calorie/carb diet such as [employer].  Intermittent fasting discussed.  Aerobic activities 5 times a week and weightlifting every 2 days.  Other orders - progesterone (PROMETRIUM) 100 MG capsule; Take 1 capsule (100 mg total) by mouth at bedtime.  Princess Bruins MD, 2:51 PM 01/02/2019

## 2019-01-05 ENCOUNTER — Other Ambulatory Visit: Payer: Self-pay | Admitting: Internal Medicine

## 2019-01-06 ENCOUNTER — Encounter: Payer: Self-pay | Admitting: *Deleted

## 2019-01-06 ENCOUNTER — Encounter: Payer: Self-pay | Admitting: Obstetrics & Gynecology

## 2019-01-06 DIAGNOSIS — F4322 Adjustment disorder with anxiety: Secondary | ICD-10-CM | POA: Diagnosis not present

## 2019-01-06 LAB — PAP IG W/ RFLX HPV ASCU

## 2019-01-06 NOTE — Telephone Encounter (Signed)
Last filled:11/12/2018 Last OV:10/25/18 Upcoming appt:01/21/2019

## 2019-01-06 NOTE — Patient Instructions (Signed)
1. Encounter for routine gynecological examination with Papanicolaou smear of cervix Normal gynecologic exam.  Pap reflex done.  Breast exam normal.  Last mammogram October 2019 was negative.  Colonoscopy in 2017.  Health labs with family physician.  2. Encounter for routine checking of intrauterine contraceptive device (IUD) Mirena IUD to be in good position and well-tolerated since April 2016.  3. Perimenopause Perimenopause with oligomenorrhea and hot flushes.  Mild help with Estroven over-the-counter.  Decision to start with Prometrium 100 mg daily at bedtime.  Usage reviewed and prescription sent to pharmacy.  4. Class 2 obesity due to excess calories without serious comorbidity with body mass index (BMI) of 36.0 to 36.9 in adult Recommend lower calorie/carb diet such as [employer].  Intermittent fasting discussed.  Aerobic activities 5 times a week and weightlifting every 2 days.  Other orders - progesterone (PROMETRIUM) 100 MG capsule; Take 1 capsule (100 mg total) by mouth at bedtime.  Brandy Maynard, it was a pleasure seeing you today!  I will inform you of your results as soon as they are available.

## 2019-01-10 MED ORDER — FLUCONAZOLE 150 MG PO TABS: 150.0000 mg | ORAL_TABLET | Freq: Every day | ORAL | 0 refills | Status: DC

## 2019-01-21 ENCOUNTER — Ambulatory Visit: Payer: Self-pay | Admitting: Internal Medicine

## 2019-02-14 NOTE — Progress Notes (Signed)
Virtual Visit via Video Note  I connected with@ on 02/17/19 at 10:30 AM EDT by a video enabled telemedicine application and verified that I am speaking with the correct person using two identifiers. Location patient: home Location provider:work office Persons participating in the virtual visit: patient, provider  WIth national recommendations  regarding COVID 19 pandemic   video visit is advised over in office visit for this patient.  Discussed the limitations of evaluation and management by telemedicine and  availability of in person appointments. The patient expressed understanding and agreed to proceed.   HPI: Brandy Maynard Presents for virtual visit   For med check  On phentermine  Stopped about 3 weeks ago  Felt se worse than good effect mostly constipation and not that great Is in menopause   And stopped prog gyne gave her .   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  Past Medical History:  Diagnosis Date  . Allergy   . Anemia   . ANEMIA 12/15/2009   Qualifier: Diagnosis of  By: Regis Bill MD, Standley Brooking   . Depression   . GERD (gastroesophageal reflux disease)   . Heavy menses    on ocps  . IBS (irritable bowel syndrome)    by hx and had a neg colonscopy  . Migraines     Past Surgical History:  Procedure Laterality Date  . CHOLECYSTECTOMY    . COLONOSCOPY  08/03/2005  . COLONOSCOPY W/ BIOPSIES  Nov 12 2015  . ESOPHAGOGASTRODUODENOSCOPY ENDOSCOPY  Nov 12 2015  . FOOT SURGERY    . KNEE ARTHROSCOPY  2012   right knee,,, left knee at 15  . LIGAMENT REPAIR  2002   Rt ankle  . PELVIC LAPAROSCOPY  10/11/2005   LAPAROTOMY, LYSIS OF PELVIC ADHESIONS, RSO AND CAUTERIZATION AND TRANSECTION OF LEFT FALLOPIAN TUBE  . PLICA, arthritis and  cartilage repair  01/11/2011   on Rt knee  . TUBAL LIGATION  09/2005   scar tissue removal ovarian cyst    Family History  Problem Relation Age of Onset  . Arthritis Mother   . Hypertension Mother   . Cancer Mother        MELANOMA  . Hyperlipidemia Mother   . Hypertension Maternal Grandmother   . Dementia Maternal Grandmother   . Hypertension Maternal Grandfather   . Cancer Maternal Grandfather        MELANOMA  . Heart disease Paternal Grandmother   . Diabetes Paternal Grandfather   . Heart disease Paternal Grandfather   . Heart disease Father   . Cancer - Ovarian Other        Mat Great Grandmother    Social History   Tobacco Use  . Smoking status: Never Smoker  . Smokeless tobacco: Never Used  Substance Use Topics  . Alcohol use: Yes    Alcohol/week: 0.0 standard drinks    Comment: 1-2 DRINKS A MONTH   . Drug use: No      Current Outpatient Medications:  .  cetirizine (ZYRTEC) 10 MG tablet, Take 10 mg by mouth daily., Disp: , Rfl:  .  fluconazole (DIFLUCAN) 150 MG tablet, Take 1 tablet (150 mg total) by mouth daily., Disp: 3 tablet,  Rfl: 0 .  Melatonin 5 MG TABS, Take 5 mg by mouth daily., Disp: , Rfl:  .  metFORMIN (GLUCOPHAGE-XR) 500 MG 24 hr tablet, Take 1 tablet (500 mg total) by mouth daily. Increase to 2 per day with meal after 1-2 weeks, Disp: 60 tablet, Rfl: 3 .  Nutritional Supplements (ESTROVEN EXTRA STRENGTH PO), Take 1 tablet by mouth daily., Disp: , Rfl:  .  omeprazole (PRILOSEC OTC) 20 MG tablet, Take 20 mg by mouth daily. , Disp: , Rfl:  .  progesterone (PROMETRIUM) 100 MG capsule, Take 1 capsule (100 mg total) by mouth at bedtime., Disp: 90 capsule, Rfl: 4  EXAM: BP Readings from Last 3 Encounters:  01/02/19 124/80  11/15/18 132/90  11/12/18 126/82    VITALS per patient if applicable:  GENERAL: alert, oriented, appears well and in no acute distress  HEENT: atraumatic, conjunttiva clear, no obvious abnormalities on inspection of external  nose and ears  NECK: normal movements of the head and neck  LUNGS: on inspection no signs of respiratory distress, breathing rate appears normal, no obvious gross SOB, gasping or wheezing  CV: no obvious cyanosis  MS: moves all visible extremities without noticeable abnormality  PSYCH/NEURO: pleasant and cooperative, no obvious depression or anxiety, speech and thought processing grossly intact Lab Results  Component Value Date   WBC 8.6 06/07/2018   HGB 14.1 06/07/2018   HCT 41.8 06/07/2018   PLT 216.0 06/07/2018   GLUCOSE 90 06/07/2018   CHOL 147 06/07/2018   TRIG 73.0 06/07/2018   HDL 47.70 06/07/2018   LDLCALC 85 06/07/2018   ALT 13 06/07/2018   AST 14 06/07/2018   NA 139 06/07/2018   K 4.2 06/07/2018   CL 105 06/07/2018   CREATININE 0.83 06/07/2018   BUN 16 06/07/2018   CO2 27 06/07/2018   TSH 2.57 06/07/2018   HGBA1C 6.2 06/07/2018    ASSESSMENT AND PLAN:  Discussed the following assessment and plan:  Medication management - se of phentermine and not working that well   disc   BMI 34.0-34.9,adult  Hyperglycemia - cw ibnsuline resistance  and obesity disc metformin  Perimenopausal  Counseled. 15 minutes   Continue physical activity even though    Home and social distancing Although metformin not a weight loss drug can be helpful is those susceptible to diabetes and prevention Benefit more than risk of medications  Reasonable to try  Begin 500 per day and inc to 1000 mg per day    Send in message my chart about how Doing VV if  ?s problems   Goal may be 15 mgg per day  As tolerated   Disc pot se of med  If ongoing we can do OV and hg a1c other labs  cpx option  in 3 mosp or when due  Expectant management and discussion of plan and treatment with patient with opportunity to ask questions and all were answered. The patient agreed with the plan and demonstrated an understanding of the instructions.   The patient was advised to call back or seek an in-person  evaluation if worsening  or having concerns .   Shanon Ace, MD

## 2019-02-17 ENCOUNTER — Ambulatory Visit (INDEPENDENT_AMBULATORY_CARE_PROVIDER_SITE_OTHER): Payer: BLUE CROSS/BLUE SHIELD | Admitting: Internal Medicine

## 2019-02-17 ENCOUNTER — Encounter: Payer: Self-pay | Admitting: Internal Medicine

## 2019-02-17 ENCOUNTER — Other Ambulatory Visit: Payer: Self-pay

## 2019-02-17 DIAGNOSIS — Z6834 Body mass index (BMI) 34.0-34.9, adult: Secondary | ICD-10-CM

## 2019-02-17 DIAGNOSIS — R739 Hyperglycemia, unspecified: Secondary | ICD-10-CM

## 2019-02-17 DIAGNOSIS — Z79899 Other long term (current) drug therapy: Secondary | ICD-10-CM

## 2019-02-17 MED ORDER — METFORMIN HCL ER 500 MG PO TB24: 500.0000 mg | ORAL_TABLET | Freq: Every day | ORAL | 3 refills | Status: DC

## 2019-03-24 DIAGNOSIS — Z6835 Body mass index (BMI) 35.0-35.9, adult: Secondary | ICD-10-CM

## 2019-03-24 DIAGNOSIS — Z13220 Encounter for screening for lipoid disorders: Secondary | ICD-10-CM

## 2019-03-24 DIAGNOSIS — R739 Hyperglycemia, unspecified: Secondary | ICD-10-CM

## 2019-03-24 DIAGNOSIS — R635 Abnormal weight gain: Secondary | ICD-10-CM

## 2019-03-24 DIAGNOSIS — Z79899 Other long term (current) drug therapy: Secondary | ICD-10-CM

## 2019-03-25 DIAGNOSIS — S93491A Sprain of other ligament of right ankle, initial encounter: Secondary | ICD-10-CM | POA: Diagnosis not present

## 2019-03-25 DIAGNOSIS — S93601A Unspecified sprain of right foot, initial encounter: Secondary | ICD-10-CM | POA: Diagnosis not present

## 2019-03-25 NOTE — Telephone Encounter (Signed)
Great that tolerating  medicaiton and going in the right direction. I have placed orders   Please have her get fasting lab  Pre visit  CPX in August or September 2020 I have placed orders already y Refill her med if needed

## 2019-04-15 DIAGNOSIS — S93491D Sprain of other ligament of right ankle, subsequent encounter: Secondary | ICD-10-CM | POA: Diagnosis not present

## 2019-04-22 DIAGNOSIS — S93401D Sprain of unspecified ligament of right ankle, subsequent encounter: Secondary | ICD-10-CM | POA: Diagnosis not present

## 2019-04-22 DIAGNOSIS — R262 Difficulty in walking, not elsewhere classified: Secondary | ICD-10-CM | POA: Diagnosis not present

## 2019-04-22 DIAGNOSIS — M6281 Muscle weakness (generalized): Secondary | ICD-10-CM | POA: Diagnosis not present

## 2019-04-22 DIAGNOSIS — M25571 Pain in right ankle and joints of right foot: Secondary | ICD-10-CM | POA: Diagnosis not present

## 2019-04-25 DIAGNOSIS — S93401D Sprain of unspecified ligament of right ankle, subsequent encounter: Secondary | ICD-10-CM | POA: Diagnosis not present

## 2019-04-25 DIAGNOSIS — M25571 Pain in right ankle and joints of right foot: Secondary | ICD-10-CM | POA: Diagnosis not present

## 2019-04-25 DIAGNOSIS — R262 Difficulty in walking, not elsewhere classified: Secondary | ICD-10-CM | POA: Diagnosis not present

## 2019-04-25 DIAGNOSIS — M6281 Muscle weakness (generalized): Secondary | ICD-10-CM | POA: Diagnosis not present

## 2019-04-29 DIAGNOSIS — M25571 Pain in right ankle and joints of right foot: Secondary | ICD-10-CM | POA: Diagnosis not present

## 2019-04-29 DIAGNOSIS — R262 Difficulty in walking, not elsewhere classified: Secondary | ICD-10-CM | POA: Diagnosis not present

## 2019-04-29 DIAGNOSIS — S93401D Sprain of unspecified ligament of right ankle, subsequent encounter: Secondary | ICD-10-CM | POA: Diagnosis not present

## 2019-04-29 DIAGNOSIS — M6281 Muscle weakness (generalized): Secondary | ICD-10-CM | POA: Diagnosis not present

## 2019-05-01 DIAGNOSIS — R262 Difficulty in walking, not elsewhere classified: Secondary | ICD-10-CM | POA: Diagnosis not present

## 2019-05-01 DIAGNOSIS — S93401D Sprain of unspecified ligament of right ankle, subsequent encounter: Secondary | ICD-10-CM | POA: Diagnosis not present

## 2019-05-01 DIAGNOSIS — M25571 Pain in right ankle and joints of right foot: Secondary | ICD-10-CM | POA: Diagnosis not present

## 2019-05-01 DIAGNOSIS — M6281 Muscle weakness (generalized): Secondary | ICD-10-CM | POA: Diagnosis not present

## 2019-05-06 DIAGNOSIS — S93401D Sprain of unspecified ligament of right ankle, subsequent encounter: Secondary | ICD-10-CM | POA: Diagnosis not present

## 2019-05-06 DIAGNOSIS — R262 Difficulty in walking, not elsewhere classified: Secondary | ICD-10-CM | POA: Diagnosis not present

## 2019-05-06 DIAGNOSIS — M25571 Pain in right ankle and joints of right foot: Secondary | ICD-10-CM | POA: Diagnosis not present

## 2019-05-06 DIAGNOSIS — M6281 Muscle weakness (generalized): Secondary | ICD-10-CM | POA: Diagnosis not present

## 2019-05-08 DIAGNOSIS — M25571 Pain in right ankle and joints of right foot: Secondary | ICD-10-CM | POA: Diagnosis not present

## 2019-05-08 DIAGNOSIS — M6281 Muscle weakness (generalized): Secondary | ICD-10-CM | POA: Diagnosis not present

## 2019-05-08 DIAGNOSIS — S93401D Sprain of unspecified ligament of right ankle, subsequent encounter: Secondary | ICD-10-CM | POA: Diagnosis not present

## 2019-05-08 DIAGNOSIS — R262 Difficulty in walking, not elsewhere classified: Secondary | ICD-10-CM | POA: Diagnosis not present

## 2019-05-13 DIAGNOSIS — M6281 Muscle weakness (generalized): Secondary | ICD-10-CM | POA: Diagnosis not present

## 2019-05-13 DIAGNOSIS — S93401D Sprain of unspecified ligament of right ankle, subsequent encounter: Secondary | ICD-10-CM | POA: Diagnosis not present

## 2019-05-13 DIAGNOSIS — R262 Difficulty in walking, not elsewhere classified: Secondary | ICD-10-CM | POA: Diagnosis not present

## 2019-05-13 DIAGNOSIS — M25571 Pain in right ankle and joints of right foot: Secondary | ICD-10-CM | POA: Diagnosis not present

## 2019-05-15 DIAGNOSIS — R262 Difficulty in walking, not elsewhere classified: Secondary | ICD-10-CM | POA: Diagnosis not present

## 2019-05-15 DIAGNOSIS — S93401D Sprain of unspecified ligament of right ankle, subsequent encounter: Secondary | ICD-10-CM | POA: Diagnosis not present

## 2019-05-15 DIAGNOSIS — M25571 Pain in right ankle and joints of right foot: Secondary | ICD-10-CM | POA: Diagnosis not present

## 2019-05-15 DIAGNOSIS — M6281 Muscle weakness (generalized): Secondary | ICD-10-CM | POA: Diagnosis not present

## 2019-05-27 DIAGNOSIS — S93401D Sprain of unspecified ligament of right ankle, subsequent encounter: Secondary | ICD-10-CM | POA: Diagnosis not present

## 2019-05-27 DIAGNOSIS — M6281 Muscle weakness (generalized): Secondary | ICD-10-CM | POA: Diagnosis not present

## 2019-05-27 DIAGNOSIS — R262 Difficulty in walking, not elsewhere classified: Secondary | ICD-10-CM | POA: Diagnosis not present

## 2019-05-27 DIAGNOSIS — M25571 Pain in right ankle and joints of right foot: Secondary | ICD-10-CM | POA: Diagnosis not present

## 2019-05-28 DIAGNOSIS — F4322 Adjustment disorder with anxiety: Secondary | ICD-10-CM | POA: Diagnosis not present

## 2019-05-29 DIAGNOSIS — S93401D Sprain of unspecified ligament of right ankle, subsequent encounter: Secondary | ICD-10-CM | POA: Diagnosis not present

## 2019-05-29 DIAGNOSIS — M25571 Pain in right ankle and joints of right foot: Secondary | ICD-10-CM | POA: Diagnosis not present

## 2019-05-29 DIAGNOSIS — M6281 Muscle weakness (generalized): Secondary | ICD-10-CM | POA: Diagnosis not present

## 2019-05-29 DIAGNOSIS — R262 Difficulty in walking, not elsewhere classified: Secondary | ICD-10-CM | POA: Diagnosis not present

## 2019-06-03 DIAGNOSIS — R262 Difficulty in walking, not elsewhere classified: Secondary | ICD-10-CM | POA: Diagnosis not present

## 2019-06-03 DIAGNOSIS — S93401D Sprain of unspecified ligament of right ankle, subsequent encounter: Secondary | ICD-10-CM | POA: Diagnosis not present

## 2019-06-03 DIAGNOSIS — M25571 Pain in right ankle and joints of right foot: Secondary | ICD-10-CM | POA: Diagnosis not present

## 2019-06-03 DIAGNOSIS — M6281 Muscle weakness (generalized): Secondary | ICD-10-CM | POA: Diagnosis not present

## 2019-06-11 DIAGNOSIS — M6281 Muscle weakness (generalized): Secondary | ICD-10-CM | POA: Diagnosis not present

## 2019-06-11 DIAGNOSIS — S93401D Sprain of unspecified ligament of right ankle, subsequent encounter: Secondary | ICD-10-CM | POA: Diagnosis not present

## 2019-06-11 DIAGNOSIS — R262 Difficulty in walking, not elsewhere classified: Secondary | ICD-10-CM | POA: Diagnosis not present

## 2019-06-11 DIAGNOSIS — M25571 Pain in right ankle and joints of right foot: Secondary | ICD-10-CM | POA: Diagnosis not present

## 2019-07-09 ENCOUNTER — Other Ambulatory Visit: Payer: Self-pay | Admitting: Internal Medicine

## 2019-07-16 ENCOUNTER — Other Ambulatory Visit: Payer: Self-pay

## 2019-07-16 ENCOUNTER — Other Ambulatory Visit (INDEPENDENT_AMBULATORY_CARE_PROVIDER_SITE_OTHER): Payer: PRIVATE HEALTH INSURANCE

## 2019-07-16 DIAGNOSIS — Z79899 Other long term (current) drug therapy: Secondary | ICD-10-CM

## 2019-07-16 DIAGNOSIS — Z6835 Body mass index (BMI) 35.0-35.9, adult: Secondary | ICD-10-CM

## 2019-07-16 DIAGNOSIS — R739 Hyperglycemia, unspecified: Secondary | ICD-10-CM

## 2019-07-16 DIAGNOSIS — R635 Abnormal weight gain: Secondary | ICD-10-CM

## 2019-07-16 DIAGNOSIS — Z13220 Encounter for screening for lipoid disorders: Secondary | ICD-10-CM

## 2019-07-16 LAB — CBC WITH DIFFERENTIAL/PLATELET
Basophils Absolute: 0.1 10*3/uL (ref 0.0–0.1)
Basophils Relative: 0.8 % (ref 0.0–3.0)
Eosinophils Absolute: 0.1 10*3/uL (ref 0.0–0.7)
Eosinophils Relative: 2.1 % (ref 0.0–5.0)
HCT: 41.8 % (ref 36.0–46.0)
Hemoglobin: 14.2 g/dL (ref 12.0–15.0)
Lymphocytes Relative: 27.6 % (ref 12.0–46.0)
Lymphs Abs: 1.8 10*3/uL (ref 0.7–4.0)
MCHC: 34 g/dL (ref 30.0–36.0)
MCV: 87.7 fl (ref 78.0–100.0)
Monocytes Absolute: 0.4 10*3/uL (ref 0.1–1.0)
Monocytes Relative: 6.8 % (ref 3.0–12.0)
Neutro Abs: 4 10*3/uL (ref 1.4–7.7)
Neutrophils Relative %: 62.7 % (ref 43.0–77.0)
Platelets: 254 10*3/uL (ref 150.0–400.0)
RBC: 4.76 Mil/uL (ref 3.87–5.11)
RDW: 13 % (ref 11.5–15.5)
WBC: 6.4 10*3/uL (ref 4.0–10.5)

## 2019-07-16 LAB — LIPID PANEL
Cholesterol: 188 mg/dL (ref 0–200)
HDL: 59.6 mg/dL (ref >39.00)
LDL Cholesterol: 104 mg/dL — ABNORMAL HIGH (ref 0–99)
NonHDL: 128.57
Total CHOL/HDL Ratio: 3
Triglycerides: 124 mg/dL (ref 0.0–149.0)
VLDL: 24.8 mg/dL (ref 0.0–40.0)

## 2019-07-16 LAB — HEMOGLOBIN A1C: Hgb A1c MFr Bld: 6 % (ref 4.6–6.5)

## 2019-07-16 LAB — HEPATIC FUNCTION PANEL
ALT: 16 U/L (ref 0–35)
AST: 18 U/L (ref 0–37)
Albumin: 4.2 g/dL (ref 3.5–5.2)
Alkaline Phosphatase: 115 U/L (ref 39–117)
Bilirubin, Direct: 0.1 mg/dL (ref 0.0–0.3)
Total Bilirubin: 0.4 mg/dL (ref 0.2–1.2)
Total Protein: 6.7 g/dL (ref 6.0–8.3)

## 2019-07-16 LAB — BASIC METABOLIC PANEL
BUN: 18 mg/dL (ref 6–23)
CO2: 25 mEq/L (ref 19–32)
Calcium: 9.8 mg/dL (ref 8.4–10.5)
Chloride: 105 mEq/L (ref 96–112)
Creatinine, Ser: 0.83 mg/dL (ref 0.40–1.20)
GFR: 72.62 mL/min (ref >60.00)
Glucose, Bld: 106 mg/dL — ABNORMAL HIGH (ref 70–99)
Potassium: 4.2 mEq/L (ref 3.5–5.1)
Sodium: 139 mEq/L (ref 135–145)

## 2019-07-16 LAB — TSH: TSH: 3.62 u[IU]/mL (ref 0.35–4.50)

## 2019-07-22 NOTE — Patient Instructions (Addendum)
Continue metformin  2 500 mg per day . consider [facility] weight management referral    [address], [place], [place] [id number] Let me know if you want referral  Otherwise  Track  And continue. Check into saxenda as a injectable weight loss med .  ROV in 4-6 mos to check a1c etc    Health Maintenance, Female Adopting a healthy lifestyle and getting preventive care are important in promoting health and wellness. Ask your health care provider about:  The right schedule for you to have regular tests and exams.  Things you can do on your own to prevent diseases and keep yourself healthy. What should I know about diet, weight, and exercise? Eat a healthy diet   Eat a diet that includes plenty of vegetables, fruits, low-fat dairy products, and lean protein.  Do not eat a lot of foods that are high in solid fats, added sugars, or sodium. Maintain a healthy weight Body mass index (BMI) is used to identify weight problems. It estimates body fat based on height and weight. Your health care provider can help determine your BMI and help you achieve or maintain a healthy weight. Get regular exercise Get regular exercise. This is one of the most important things you can do for your health. Most adults should:  Exercise for at least 150 minutes each week. The exercise should increase your heart rate and make you sweat (moderate-intensity exercise).  Do strengthening exercises at least twice a week. This is in addition to the moderate-intensity exercise.  Spend less time sitting. Even light physical activity can be beneficial. Watch cholesterol and blood lipids Have your blood tested for lipids and cholesterol at 50 years of age, then have this test every 5 years. Have your cholesterol levels checked more often if:  Your lipid or cholesterol levels are high.  You are older than 50 years of age.  You are at high risk for heart disease. What should I know about cancer screening? Depending on your  health history and family history, you may need to have cancer screening at various ages. This may include screening for:  Breast cancer.  Cervical cancer.  Colorectal cancer.  Skin cancer.  Lung cancer. What should I know about heart disease, diabetes, and high blood pressure? Blood pressure and heart disease  High blood pressure causes heart disease and increases the risk of stroke. This is more likely to develop in people who have high blood pressure readings, are of African descent, or are overweight.  Have your blood pressure checked: ? Every 3-5 years if you are 20-79 years of age. ? Every year if you are 52 years old or older. Diabetes Have regular diabetes screenings. This checks your fasting blood sugar level. Have the screening done:  Once every three years after age 36 if you are at a normal weight and have a low risk for diabetes.  More often and at a younger age if you are overweight or have a high risk for diabetes. What should I know about preventing infection? Hepatitis B If you have a higher risk for hepatitis B, you should be screened for this virus. Talk with your health care provider to find out if you are at risk for hepatitis B infection. Hepatitis C Testing is recommended for:  Everyone born from [birth year] through 1967.  Anyone with known risk factors for hepatitis C. Sexually transmitted infections (STIs)  Get screened for STIs, including gonorrhea and chlamydia, if: ? You are sexually active and  are younger than 50 years of age. ? You are older than 50 years of age and your health care provider tells you that you are at risk for this type of infection. ? Your sexual activity has changed since you were last screened, and you are at increased risk for chlamydia or gonorrhea. Ask your health care provider if you are at risk.  Ask your health care provider about whether you are at high risk for HIV. Your health care provider may recommend a prescription  medicine to help prevent HIV infection. If you choose to take medicine to prevent HIV, you should first get tested for HIV. You should then be tested every 3 months for as long as you are taking the medicine. Pregnancy  If you are about to stop having your period (premenopausal) and you may become pregnant, seek counseling before you get pregnant.  Take 400 to 800 micrograms (mcg) of folic acid every day if you become pregnant.  Ask for birth control (contraception) if you want to prevent pregnancy. Osteoporosis and menopause Osteoporosis is a disease in which the bones lose minerals and strength with aging. This can result in bone fractures. If you are 53 years old or older, or if you are at risk for osteoporosis and fractures, ask your health care provider if you should:  Be screened for bone loss.  Take a calcium or vitamin D supplement to lower your risk of fractures.  Be given hormone replacement therapy (HRT) to treat symptoms of menopause. Follow these instructions at home: Lifestyle  Do not use any products that contain nicotine or tobacco, such as cigarettes, e-cigarettes, and chewing tobacco. If you need help quitting, ask your health care provider.  Do not use street drugs.  Do not share needles.  Ask your health care provider for help if you need support or information about quitting drugs. Alcohol use  Do not drink alcohol if: ? Your health care provider tells you not to drink. ? You are pregnant, may be pregnant, or are planning to become pregnant.  If you drink alcohol: ? Limit how much you use to 0-1 drink a day. ? Limit intake if you are breastfeeding.  Be aware of how much alcohol is in your drink. In the [place] one drink equals one 12 oz bottle of beer (355 mL), one 5 oz glass of wine (148 mL), or one 1 oz glass of hard liquor (44 mL). General instructions  Schedule regular health, dental, and eye exams.  Stay current with your vaccines.  Tell your health  care provider if: ? You often feel depressed. ? You have ever been abused or do not feel safe at home. Summary  Adopting a healthy lifestyle and getting preventive care are important in promoting health and wellness.  Follow your health care provider's instructions about healthy diet, exercising, and getting tested or screened for diseases.  Follow your health care provider's instructions on monitoring your cholesterol and blood pressure. This information is not intended to replace advice given to you by your health care provider. Make sure you discuss any questions you have with your health care provider. Document Released: 05/01/2011 Document Revised: 10/09/2018 Document Reviewed: 10/09/2018 Elsevier Patient Education  2020 [employer].

## 2019-07-22 NOTE — Progress Notes (Signed)
Chief Complaint  Patient presents with  . Annual Exam    Pt is concerned about weight    HPI: Patient  Brandy Maynard  48 y.o. comes in today for Preventive Health Care visit  And fu lab and  Weight and  bg  Leg injury  Rehab from June  And husband lost job   Lots of stress  2020 has been a difficult  Year so far .  Masks make her feel anxious so doesn go out much  Metformin   Ran out and then didn't get back to bid until 2 weeks ago but was taking on per day  Has gained 10 # since January .   bg  Twice a week   70 and 80s . No lows   Feels gained weight with dec activity and  Many changes and stay at home.   Had colonscopy about 2 years ago and is utd  No  fam hx Health Maintenance  Topic Date Due  . HIV Screening  01/13/1984  . COLONOSCOPY  01/13/2019  . INFLUENZA VACCINE  05/31/2019  . MAMMOGRAM  08/28/2019  . PAP SMEAR-Modifier  01/02/2020  . TETANUS/TDAP  04/29/2022   Health Maintenance Review LIFESTYLE:  Exercise:   Back up to 2 miles  Tobacco/ETS: no Alcohol:    Social  Sugar beverages:  water Sleep: 6  Drug use: no HH of   2   Dog  Work: part time.      Real  estate   ROS:  lips feel warm and throb for a few weeks using lip balm all the time  To keep moist  ? If some have min in it  GEN/ HEENT: No fever, significant weight changes sweats headaches vision problems hearing changes, CV/ PULM; No chest pain shortness of breath cough, syncope,edema  change in exercise tolerance. GI /GU: No adominal pain, vomiting, change in bowel habits. No blood in the stool. No significant GU symptoms. SKIN/HEME: ,no acute skin rashes suspicious lesions or bleeding. No lymphadenopathy, nodules, masses.  NEURO/ PSYCH:  No neurologic signs such as weakness numbness. No depression anxiety. IMM/ Allergy: No unusual infections.  Allergy .   REST of 12 system review negative except as per HPI   Past Medical History:  Diagnosis Date  . Allergy   . Anemia   . ANEMIA 12/15/2009   Qualifier: Diagnosis of  By: Regis Bill MD, Standley Brooking   . Depression   . GERD (gastroesophageal reflux disease)   . Heavy menses    on ocps  . IBS (irritable bowel syndrome)    by hx and had a neg colonscopy  . Migraines     Past Surgical History:  Procedure Laterality Date  . CHOLECYSTECTOMY    . COLONOSCOPY  08/03/2005  . COLONOSCOPY W/ BIOPSIES  Nov 12 2015  . ESOPHAGOGASTRODUODENOSCOPY ENDOSCOPY  Nov 12 2015  . FOOT SURGERY    . KNEE ARTHROSCOPY  2012   right knee,,, left knee at 15  . LIGAMENT REPAIR  2002   Rt ankle  . PELVIC LAPAROSCOPY  10/11/2005   LAPAROTOMY, LYSIS OF PELVIC ADHESIONS, RSO AND CAUTERIZATION AND TRANSECTION OF LEFT FALLOPIAN TUBE  . PLICA, arthritis and cartilage repair  01/11/2011   on Rt knee  . TUBAL LIGATION  09/2005   scar tissue removal ovarian cyst    Family History  Problem Relation Age of Onset  . Arthritis Mother   . Hypertension Mother   . Cancer Mother  MELANOMA  . Hyperlipidemia Mother   . Hypertension Maternal Grandmother   . Dementia Maternal Grandmother   . Hypertension Maternal Grandfather   . Cancer Maternal Grandfather        MELANOMA  . Heart disease Paternal Grandmother   . Diabetes Paternal Grandfather   . Heart disease Paternal Grandfather   . Heart disease Father   . Cancer - Ovarian Other        Mat Great Grandmother    Social History   Socioeconomic History  . Marital status: Married    Spouse name: Not on file  . Number of children: Not on file  . Years of education: Not on file  . Highest education level: Not on file  Occupational History  . Not on file  Social Needs  . Financial resource strain: Not on file  . Food insecurity    Worry: Not on file    Inability: Not on file  . Transportation needs    Medical: Not on file    Non-medical: Not on file  Tobacco Use  . Smoking status: Never Smoker  . Smokeless tobacco: Never Used  Substance and Sexual Activity  . Alcohol use: Yes    Alcohol/week:  0.0 standard drinks    Comment: 1-2 DRINKS A MONTH   . Drug use: No  . Sexual activity: Yes    Partners: Male    Birth control/protection: I.U.D.    Comment: 1st intercourse- 65, parnters- 3, married- 36 yrs   Lifestyle  . Physical activity    Days per week: Not on file    Minutes per session: Not on file  . Stress: Not on file  Relationships  . Social Herbalist on phone: Not on file    Gets together: Not on file    Attends religious service: Not on file    Active member of club or organization: Not on file    Attends meetings of clubs or organizations: Not on file    Relationship status: Not on file  Other Topics Concern  . Not on file  Social History Narrative   Occupation: unemployed   Married   [place] of 3-4    Pet dogs 2   G3P2          Outpatient Medications Prior to Visit  Medication Sig Dispense Refill  . cetirizine (ZYRTEC) 10 MG tablet Take 10 mg by mouth daily.    . Melatonin 5 MG TABS Take 5 mg by mouth daily.    . metFORMIN (GLUCOPHAGE-XR) 500 MG 24 hr tablet TAKE 1 TABLET(500 MG) BY MOUTH DAILY. INCREASE TO 2 TABLETS PER DAY WITH MEAL AFTER 1-2 WEEKS 30 tablet 0  . Nutritional Supplements (ESTROVEN EXTRA STRENGTH PO) Take 1 tablet by mouth daily.    Marland Kitchen omeprazole (PRILOSEC OTC) 20 MG tablet Take 20 mg by mouth daily.     . progesterone (PROMETRIUM) 100 MG capsule Take 1 capsule (100 mg total) by mouth at bedtime. (Patient not taking: Reported on 07/23/2019) 90 capsule 4  . fluconazole (DIFLUCAN) 150 MG tablet Take 1 tablet (150 mg total) by mouth daily. 3 tablet 0   No facility-administered medications prior to visit.      EXAM:  BP 126/78 (BP Location: Right Arm, Patient Position: Sitting, Cuff Size: Normal)   Pulse 68   Temp 97.8 F (36.6 C) (Temporal)   Ht 5' 5.5" (1.664 m)   Wt 221 lb 3.2 oz (100.3 kg)   SpO2 97%  BMI 36.25 kg/m   Body mass index is 36.25 kg/m. Wt Readings from Last 3 Encounters:  07/23/19 221 lb 3.2 oz (100.3 kg)   01/02/19 214 lb (97.1 kg)  11/12/18 212 lb 9.6 oz (96.4 kg)    Physical Exam: Vital signs reviewed [id number] is a well-developed well-nourished alert cooperative    who appearsr stated age in no acute distress.  HEENT: normocephalic atraumatic , Eyes: PERRL EOM's full, conjunctiva clear, Nares: paten,t no deformity discharge or tenderness., Ears: no deformity EAC's clear TMs with normal landmarks. Mouth: clear OP,masked  Lips pink but no lesion NECK: supple without masses, thyromegaly or bruits. CHEST/PULM:  Clear to auscultation and percussion breath sounds equal no wheeze , rales or rhonchi. No chest wall deformities or tenderness. Breast: normal by inspection . No dimpling, discharge, masses, tenderness or discharge . CV: PMI is nondisplaced, S1 S2 no gallops, murmurs, rubs. Peripheral pulses are full without delay.No JVD .  ABDOMEN: Bowel sounds normal nontender  No guard or rebound, no hepato splenomegal no CVA tenderness.   Extremtities:  No clubbing cyanosis or edema, no acute joint swelling or redness no focal atrophy NEURO:  Oriented x3, cranial nerves 3-12 appear to be intact, no obvious focal weakness,gait within normal limits no abnormal reflexes or asymmetrical SKIN: No acute rashes normal turgor, color, no bruising or petechiae. PSYCH: Oriented, good eye contact, no obvious depression anxiety, cognition and judgment appear normal. LN: no cervical axillary inguinal adenopathy  Lab Results  Component Value Date   WBC 6.4 07/16/2019   HGB 14.2 07/16/2019   HCT 41.8 07/16/2019   PLT 254.0 07/16/2019   GLUCOSE 106 (H) 07/16/2019   CHOL 188 07/16/2019   TRIG 124.0 07/16/2019   HDL 59.60 07/16/2019   LDLCALC 104 (H) 07/16/2019   ALT 16 07/16/2019   AST 18 07/16/2019   NA 139 07/16/2019   K 4.2 07/16/2019   CL 105 07/16/2019   CREATININE 0.83 07/16/2019   BUN 18 07/16/2019   CO2 25 07/16/2019   TSH 3.62 07/16/2019   HGBA1C 6.0 07/16/2019    BP Readings from Last 3  Encounters:  07/23/19 126/78  01/02/19 124/80  11/15/18 132/90    Lab results reviewed with patient   ASSESSMENT AND PLAN:  Discussed the following assessment and plan:    ICD-10-CM   1. Visit for preventive health examination  Z00.00   2. Medication management  Z79.899   3. Hyperglycemia  R73.9   4. Weight gain  R63.5   5. Screening, lipid  Z13.220   6. Need for influenza vaccination  Z23 Flu Vaccine QUAD 6+ mos PF IM (Fluarix Quad PF)   Disc  risk benefit with flu vaccine and will get today .  Stop all products on lips that have menthol or  Color or flavors  Plain Vaseline type product ok . Disc  metformin and to continue tracking   consider weight management referral  She will let us know   Patient Care Team: Panosh, Standley Brooking, MD as PCP - General Vickey Huger, MD (Orthopedic Surgery) Jerrell Belfast, MD (Otolaryngology) Patient Instructions  Continue metformin  2 500 mg per day . consider [facility] weight management referral    [place], [place], [place] [id number] Let me know if you want referral  Otherwise  Track  And continue. Check into saxenda as a injectable weight loss med .  ROV in 4-6 mos to check a1c etc    Health Maintenance, Female Adopting a healthy lifestyle  and getting preventive care are important in promoting health and wellness. Ask your health care provider about:  The right schedule for you to have regular tests and exams.  Things you can do on your own to prevent diseases and keep yourself healthy. What should I know about diet, weight, and exercise? Eat a healthy diet   Eat a diet that includes plenty of vegetables, fruits, low-fat dairy products, and lean protein.  Do not eat a lot of foods that are high in solid fats, added sugars, or sodium. Maintain a healthy weight Body mass index (BMI) is used to identify weight problems. It estimates body fat based on height and weight. Your health care provider can help determine your BMI and help you  achieve or maintain a healthy weight. Get regular exercise Get regular exercise. This is one of the most important things you can do for your health. Most adults should:  Exercise for at least 150 minutes each week. The exercise should increase your heart rate and make you sweat (moderate-intensity exercise).  Do strengthening exercises at least twice a week. This is in addition to the moderate-intensity exercise.  Spend less time sitting. Even light physical activity can be beneficial. Watch cholesterol and blood lipids Have your blood tested for lipids and cholesterol at 50 years of age, then have this test every 5 years. Have your cholesterol levels checked more often if:  Your lipid or cholesterol levels are high.  You are older than 50 years of age.  You are at high risk for heart disease. What should I know about cancer screening? Depending on your health history and family history, you may need to have cancer screening at various ages. This may include screening for:  Breast cancer.  Cervical cancer.  Colorectal cancer.  Skin cancer.  Lung cancer. What should I know about heart disease, diabetes, and high blood pressure? Blood pressure and heart disease  High blood pressure causes heart disease and increases the risk of stroke. This is more likely to develop in people who have high blood pressure readings, are of African descent, or are overweight.  Have your blood pressure checked: ? Every 3-5 years if you are 99-56 years of age. ? Every year if you are 43 years old or older. Diabetes Have regular diabetes screenings. This checks your fasting blood sugar level. Have the screening done:  Once every three years after age 77 if you are at a normal weight and have a low risk for diabetes.  More often and at a younger age if you are overweight or have a high risk for diabetes. What should I know about preventing infection? Hepatitis B If you have a higher risk for  hepatitis B, you should be screened for this virus. Talk with your health care provider to find out if you are at risk for hepatitis B infection. Hepatitis C Testing is recommended for:  Everyone born from [birth year] through 1967.  Anyone with known risk factors for hepatitis C. Sexually transmitted infections (STIs)  Get screened for STIs, including gonorrhea and chlamydia, if: ? You are sexually active and are younger than 50 years of age. ? You are older than 50 years of age and your health care provider tells you that you are at risk for this type of infection. ? Your sexual activity has changed since you were last screened, and you are at increased risk for chlamydia or gonorrhea. Ask your health care provider if you are at risk.  Ask  your health care provider about whether you are at high risk for HIV. Your health care provider may recommend a prescription medicine to help prevent HIV infection. If you choose to take medicine to prevent HIV, you should first get tested for HIV. You should then be tested every 3 months for as long as you are taking the medicine. Pregnancy  If you are about to stop having your period (premenopausal) and you may become pregnant, seek counseling before you get pregnant.  Take 400 to 800 micrograms (mcg) of folic acid every day if you become pregnant.  Ask for birth control (contraception) if you want to prevent pregnancy. Osteoporosis and menopause Osteoporosis is a disease in which the bones lose minerals and strength with aging. This can result in bone fractures. If you are 60 years old or older, or if you are at risk for osteoporosis and fractures, ask your health care provider if you should:  Be screened for bone loss.  Take a calcium or vitamin D supplement to lower your risk of fractures.  Be given hormone replacement therapy (HRT) to treat symptoms of menopause. Follow these instructions at home: Lifestyle  Do not use any products that contain  nicotine or tobacco, such as cigarettes, e-cigarettes, and chewing tobacco. If you need help quitting, ask your health care provider.  Do not use street drugs.  Do not share needles.  Ask your health care provider for help if you need support or information about quitting drugs. Alcohol use  Do not drink alcohol if: ? Your health care provider tells you not to drink. ? You are pregnant, may be pregnant, or are planning to become pregnant.  If you drink alcohol: ? Limit how much you use to 0-1 drink a day. ? Limit intake if you are breastfeeding.  Be aware of how much alcohol is in your drink. In the [place] one drink equals one 12 oz bottle of beer (355 mL), one 5 oz glass of wine (148 mL), or one 1 oz glass of hard liquor (44 mL). General instructions  Schedule regular health, dental, and eye exams.  Stay current with your vaccines.  Tell your health care provider if: ? You often feel depressed. ? You have ever been abused or do not feel safe at home. Summary  Adopting a healthy lifestyle and getting preventive care are important in promoting health and wellness.  Follow your health care provider's instructions about healthy diet, exercising, and getting tested or screened for diseases.  Follow your health care provider's instructions on monitoring your cholesterol and blood pressure. This information is not intended to replace advice given to you by your health care provider. Make sure you discuss any questions you have with your health care provider. Document Released: 05/01/2011 Document Revised: 10/09/2018 Document Reviewed: 10/09/2018 Elsevier Patient Education  2020 [place] Panosh M.D.

## 2019-07-23 ENCOUNTER — Other Ambulatory Visit: Payer: Self-pay

## 2019-07-23 ENCOUNTER — Encounter: Payer: Self-pay | Admitting: Internal Medicine

## 2019-07-23 ENCOUNTER — Ambulatory Visit (INDEPENDENT_AMBULATORY_CARE_PROVIDER_SITE_OTHER): Payer: PRIVATE HEALTH INSURANCE | Admitting: Internal Medicine

## 2019-07-23 VITALS — BP 126/78 | HR 68 | Temp 97.8°F | Ht 65.5 in | Wt 221.2 lb

## 2019-07-23 DIAGNOSIS — R739 Hyperglycemia, unspecified: Secondary | ICD-10-CM | POA: Diagnosis not present

## 2019-07-23 DIAGNOSIS — R635 Abnormal weight gain: Secondary | ICD-10-CM | POA: Diagnosis not present

## 2019-07-23 DIAGNOSIS — Z13220 Encounter for screening for lipoid disorders: Secondary | ICD-10-CM | POA: Diagnosis not present

## 2019-07-23 DIAGNOSIS — Z0000 Encounter for general adult medical examination without abnormal findings: Secondary | ICD-10-CM | POA: Diagnosis not present

## 2019-07-23 DIAGNOSIS — Z79899 Other long term (current) drug therapy: Secondary | ICD-10-CM | POA: Diagnosis not present

## 2019-07-23 DIAGNOSIS — Z23 Encounter for immunization: Secondary | ICD-10-CM | POA: Diagnosis not present

## 2019-08-04 ENCOUNTER — Telehealth (INDEPENDENT_AMBULATORY_CARE_PROVIDER_SITE_OTHER): Payer: PRIVATE HEALTH INSURANCE | Admitting: Family Medicine

## 2019-08-04 ENCOUNTER — Other Ambulatory Visit: Payer: Self-pay

## 2019-08-04 DIAGNOSIS — Z20828 Contact with and (suspected) exposure to other viral communicable diseases: Secondary | ICD-10-CM | POA: Diagnosis not present

## 2019-08-04 DIAGNOSIS — Z7189 Other specified counseling: Secondary | ICD-10-CM | POA: Diagnosis not present

## 2019-08-04 NOTE — Telephone Encounter (Signed)
Scheduled pt for a virtual visit to discuss everything

## 2019-08-04 NOTE — Progress Notes (Signed)
Virtual Visit via Video Note  I connected with Brandy Maynard on 08/04/19 at  3:30 PM EDT by a video enabled telemedicine application 2/2 [id number] pandemic and verified that I am speaking with the correct person using two identifiers.  Location patient: home Location provider:work or home office Persons participating in the virtual visit: patient, provider  I discussed the limitations of evaluation and management by telemedicine and the availability of in person appointments. The patient expressed understanding and agreed to proceed.   HPI: Pt is a 48 yo female with pmh sig for migraines, IBS, GERD, depression, anxiety, and seasonal allergies.  Seen for acute concern. Typically seen by Dr. Regis Bill.    Last Wednesday pt walked outside for an hour with a friend.  The friend's husband tested positive for COVID-19 today.  Pt's friend is asymptomatic.  Pt is asymptomatic but inquires about next steps.  Pt's daughter works as an Health visitor and spent the night at the house over the weekend.  Pt concerned if she needs to advised people of the possible contact.   ROS: See pertinent positives and negatives per HPI.  Past Medical History:  Diagnosis Date  . Allergy   . Anemia   . ANEMIA 12/15/2009   Qualifier: Diagnosis of  By: Regis Bill MD, Standley Brooking   . Depression   . GERD (gastroesophageal reflux disease)   . Heavy menses    on ocps  . IBS (irritable bowel syndrome)    by hx and had a neg colonscopy  . Migraines     Past Surgical History:  Procedure Laterality Date  . CHOLECYSTECTOMY    . COLONOSCOPY  08/03/2005  . COLONOSCOPY W/ BIOPSIES  Nov 12 2015  . ESOPHAGOGASTRODUODENOSCOPY ENDOSCOPY  Nov 12 2015  . FOOT SURGERY    . KNEE ARTHROSCOPY  2012   right knee,,, left knee at 15  . LIGAMENT REPAIR  2002   Rt ankle  . PELVIC LAPAROSCOPY  10/11/2005   LAPAROTOMY, LYSIS OF PELVIC ADHESIONS, RSO AND CAUTERIZATION AND TRANSECTION OF LEFT FALLOPIAN TUBE  . PLICA, arthritis and cartilage repair   01/11/2011   on Rt knee  . TUBAL LIGATION  09/2005   scar tissue removal ovarian cyst    Family History  Problem Relation Age of Onset  . Arthritis Mother   . Hypertension Mother   . Cancer Mother        MELANOMA  . Hyperlipidemia Mother   . Hypertension Maternal Grandmother   . Dementia Maternal Grandmother   . Hypertension Maternal Grandfather   . Cancer Maternal Grandfather        MELANOMA  . Heart disease Paternal Grandmother   . Diabetes Paternal Grandfather   . Heart disease Paternal Grandfather   . Heart disease Father   . Cancer - Ovarian Other        Mat Great Grandmother     Current Outpatient Medications:  .  cetirizine (ZYRTEC) 10 MG tablet, Take 10 mg by mouth daily., Disp: , Rfl:  .  Melatonin 5 MG TABS, Take 5 mg by mouth daily., Disp: , Rfl:  .  metFORMIN (GLUCOPHAGE-XR) 500 MG 24 hr tablet, TAKE 1 TABLET(500 MG) BY MOUTH DAILY. INCREASE TO 2 TABLETS PER DAY WITH MEAL AFTER 1-2 WEEKS, Disp: 30 tablet, Rfl: 0 .  Nutritional Supplements (ESTROVEN EXTRA STRENGTH PO), Take 1 tablet by mouth daily., Disp: , Rfl:  .  omeprazole (PRILOSEC OTC) 20 MG tablet, Take 20 mg by mouth daily. , Disp: , Rfl:  .  progesterone (PROMETRIUM) 100 MG capsule, Take 1 capsule (100 mg total) by mouth at bedtime. (Patient not taking: Reported on 07/23/2019), Disp: 90 capsule, Rfl: 4  EXAM:  VITALS per patient if applicable:  GENERAL: alert, oriented, appears well and in no acute distress  HEENT: atraumatic, conjunctiva clear, no obvious abnormalities on inspection of external nose and ears  NECK: normal movements of the head and neck  LUNGS: on inspection no signs of respiratory distress, breathing rate appears normal, no obvious gross SOB, gasping or wheezing  CV: no obvious cyanosis  MS: moves all visible extremities without noticeable abnormality  PSYCH/NEURO: pleasant and cooperative, no obvious depression or anxiety, speech and thought processing grossly  intact  ASSESSMENT AND PLAN:  Discussed the following assessment and plan:  Educated about COVID-19 virus infection -discussed possible symptoms -advised symptoms typically appear 7-10 days after exposure -discussed self quarantining  -pt given info on area testing locations  -discussed supportive care if symptoms develop -given precautions  F/u prn    I discussed the assessment and treatment plan with the patient. The patient was provided an opportunity to ask questions and all were answered. The patient agreed with the plan and demonstrated an understanding of the instructions.   The patient was advised to call back or seek an in-person evaluation if the symptoms worsen or if the condition fails to improve as anticipated.  Billie Ruddy, MD

## 2019-09-02 ENCOUNTER — Encounter: Payer: Self-pay | Admitting: Obstetrics & Gynecology

## 2019-09-02 LAB — HM MAMMOGRAPHY

## 2019-10-03 ENCOUNTER — Other Ambulatory Visit: Payer: Self-pay

## 2019-10-03 ENCOUNTER — Telehealth (INDEPENDENT_AMBULATORY_CARE_PROVIDER_SITE_OTHER): Payer: PRIVATE HEALTH INSURANCE | Admitting: Internal Medicine

## 2019-10-03 ENCOUNTER — Encounter: Payer: Self-pay | Admitting: Internal Medicine

## 2019-10-03 DIAGNOSIS — R739 Hyperglycemia, unspecified: Secondary | ICD-10-CM

## 2019-10-03 DIAGNOSIS — R635 Abnormal weight gain: Secondary | ICD-10-CM | POA: Diagnosis not present

## 2019-10-03 DIAGNOSIS — R61 Generalized hyperhidrosis: Secondary | ICD-10-CM | POA: Diagnosis not present

## 2019-10-03 DIAGNOSIS — Z79899 Other long term (current) drug therapy: Secondary | ICD-10-CM

## 2019-10-03 DIAGNOSIS — E162 Hypoglycemia, unspecified: Secondary | ICD-10-CM

## 2019-10-03 NOTE — Progress Notes (Signed)
Virtual Visit via Video Note  I connected with@ on 10/03/19 at  3:30 PM EST by a video enabled telemedicine application and verified that I am speaking with the correct person using two identifiers. Location patient: home Location provider: office Persons participating in the virtual visit: patient, provider  [place] national recommendations  regarding COVID 19 pandemic   video visit is advised over in office visit for this patient.  Patient aware  of the limitations of evaluation and management by telemedicine and  availability of in person appointments. and agreed to proceed.   HPI: Brandy Maynard presents for video visit  Lab pv 9 20  Having issues with low BG readings   No sx x fatigue  having a hard time  With  Now gaining weight  Taking metformin before bed   On progesterone for ? Perimenopausal sx  recoveredd from major injury this year and  Husband  Still not workin g but possibility soon. BG last week was 50 and 55   Stopped metformin and last bg 60 after 4 days .  Has tried the mod fast but eats later at night sometimes 7-8   Cost a factor now as insurance not covering aov and many meds .   aquaibntaces found to lose weight on truclicity  She has tried otc suppl go glo and stopped this .    ROS: See pertinent positives and negatives per HPI.  Past Medical History:  Diagnosis Date  . Allergy   . Anemia   . ANEMIA 12/15/2009   Qualifier: Diagnosis of  By: Regis Bill MD, Standley Brooking   . Depression   . GERD (gastroesophageal reflux disease)   . Heavy menses    on ocps  . IBS (irritable bowel syndrome)    by hx and had a neg colonscopy  . Migraines     Past Surgical History:  Procedure Laterality Date  . CHOLECYSTECTOMY    . COLONOSCOPY  08/03/2005  . COLONOSCOPY W/ BIOPSIES  Nov 12 2015  . ESOPHAGOGASTRODUODENOSCOPY ENDOSCOPY  Nov 12 2015  . FOOT SURGERY    . KNEE ARTHROSCOPY  2012   right knee,,, left knee at 15  . LIGAMENT REPAIR  2002   Rt ankle  . PELVIC  LAPAROSCOPY  10/11/2005   LAPAROTOMY, LYSIS OF PELVIC ADHESIONS, RSO AND CAUTERIZATION AND TRANSECTION OF LEFT FALLOPIAN TUBE  . PLICA, arthritis and cartilage repair  01/11/2011   on Rt knee  . TUBAL LIGATION  09/2005   scar tissue removal ovarian cyst    Family History  Problem Relation Age of Onset  . Arthritis Mother   . Hypertension Mother   . Cancer Mother        MELANOMA  . Hyperlipidemia Mother   . Hypertension Maternal Grandmother   . Dementia Maternal Grandmother   . Hypertension Maternal Grandfather   . Cancer Maternal Grandfather        MELANOMA  . Heart disease Paternal Grandmother   . Diabetes Paternal Grandfather   . Heart disease Paternal Grandfather   . Heart disease Father   . Cancer - Ovarian Other        Mat Great Grandmother    Social History   Tobacco Use  . Smoking status: Never Smoker  . Smokeless tobacco: Never Used  Substance Use Topics  . Alcohol use: Yes    Alcohol/week: 0.0 standard drinks    Comment: 1-2 DRINKS A MONTH   . Drug use: No      Current  Outpatient Medications:  .  cetirizine (ZYRTEC) 10 MG tablet, Take 10 mg by mouth daily., Disp: , Rfl:  .  Melatonin 5 MG TABS, Take 5 mg by mouth daily., Disp: , Rfl:  .  metFORMIN (GLUCOPHAGE-XR) 500 MG 24 hr tablet, TAKE 1 TABLET(500 MG) BY MOUTH DAILY. INCREASE TO 2 TABLETS PER DAY WITH MEAL AFTER 1-2 WEEKS, Disp: 30 tablet, Rfl: 0 .  Nutritional Supplements (ESTROVEN EXTRA STRENGTH PO), Take 1 tablet by mouth daily., Disp: , Rfl:  .  omeprazole (PRILOSEC OTC) 20 MG tablet, Take 20 mg by mouth daily. , Disp: , Rfl:  .  progesterone (PROMETRIUM) 100 MG capsule, Take 1 capsule (100 mg total) by mouth at bedtime. (Patient not taking: Reported on 07/23/2019), Disp: 90 capsule, Rfl: 4  EXAM: BP Readings from Last 3 Encounters:  07/23/19 126/78  01/02/19 124/80  11/15/18 132/90    VITALS per patient if applicable:  GENERAL: alert, oriented, appears well and in no acute distress  HEENT:  atraumatic, conjunttiva clear, no obvious abnormalities on inspection of external nose and ears  NECK: normal movements of the head and neck  LUNGS: on inspection no signs of respiratory distress, breathing rate appears normal, no obvious gross SOB, gasping or wheezing  CV: no obvious cyanosis  PSYCH/NEURO: pleasant and cooperative,looks discouraged  But  speech and thought processing grossly intact Lab Results  Component Value Date   WBC 6.4 07/16/2019   HGB 14.2 07/16/2019   HCT 41.8 07/16/2019   PLT 254.0 07/16/2019   GLUCOSE 106 (H) 07/16/2019   CHOL 188 07/16/2019   TRIG 124.0 07/16/2019   HDL 59.60 07/16/2019   LDLCALC 104 (H) 07/16/2019   ALT 16 07/16/2019   AST 18 07/16/2019   NA 139 07/16/2019   K 4.2 07/16/2019   CL 105 07/16/2019   CREATININE 0.83 07/16/2019   BUN 18 07/16/2019   CO2 25 07/16/2019   TSH 3.62 07/16/2019   HGBA1C 6.0 07/16/2019    ASSESSMENT AND PLAN:  Discussed the following assessment and plan:    ICD-10-CM   1. Hyperglycemia  R73.9   2. Medication management  Z79.899   3. Weight gain  R63.5   4. Night sweats  R61   5. Low blood sugar reading  E16.2    could be insulin overshoot stop metofrmin and then add back at a meal . not hs.    check bg bid and send in readings my chart   After 2+ weeks ( I will be out of office week of dec 13 and back dec 20   Can advise at that time.  She may want to talk with her gyne about other options for flushes as effecting her general well being and possible her weight .  Reassess risk of osa etc . covid 19  Pandemic effects adding to above  Counseled.   Expectant management and discussion of plan and treatment with opportunity to ask questions and all were answered. The patient agreed with the plan and demonstrated an understanding of the instructions.   Advised to call back or seek an in-person evaluation if worsening  or having  further concerns . Return for bg twice a day send in readings in 2 weeks then  plan fu.   Shanon Ace, MD

## 2019-10-28 ENCOUNTER — Encounter: Payer: Self-pay | Admitting: Internal Medicine

## 2019-11-02 ENCOUNTER — Other Ambulatory Visit: Payer: Self-pay | Admitting: Internal Medicine

## 2019-12-16 LAB — HM DIABETES EYE EXAM

## 2019-12-22 ENCOUNTER — Other Ambulatory Visit: Payer: Self-pay

## 2019-12-22 NOTE — Progress Notes (Signed)
This visit occurred during the SARS-CoV-2 public health emergency.  Safety protocols were in place, including screening questions prior to the visit, additional usage of staff PPE, and extensive cleaning of exam room while observing appropriate contact time as indicated for disinfecting solutions.    Chief Complaint  Patient presents with  . Hyperglycemia    pt is here to follow up on sugars    HPI: Brandy Maynard 49 y.o. come in for fu sugar levels  Is prediabetic   And on metformin  Still struggling with weight  Aggravated by covid shut down and external  factors  Sleep issues  Private insurance  Not paying for most  And any preventive  Joined nume.  Mindfulness technique   Taking 2 at night metformin .   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  ROS: See pertinent positives and negatives per HPI.  colon    X 3   2017     On 10 years   On recall 10  Past Medical History:  Diagnosis Date  . Allergy   . Anemia   . ANEMIA 12/15/2009   Qualifier: Diagnosis of  By: Regis Bill MD, Standley Brooking   . Depression   . GERD (gastroesophageal reflux disease)   . Heavy menses    on ocps  . IBS (irritable bowel syndrome)    by hx and had a neg colonscopy  . Migraines     Family History  Problem Relation Age of Onset  . Arthritis Mother   . Hypertension Mother   . Cancer Mother        MELANOMA  . Hyperlipidemia Mother   . Hypertension Maternal Grandmother   . Dementia Maternal Grandmother   . Hypertension Maternal Grandfather   . Cancer Maternal Grandfather        MELANOMA  . Heart disease Paternal Grandmother   . Diabetes Paternal Grandfather   . Heart disease Paternal Grandfather   . Heart disease Father   . Cancer - Ovarian Other        Mat Great Grandmother    Social History   Socioeconomic History  . Marital status: Married   Spouse name: Not on file  . Number of children: Not on file  . Years of education: Not on file  . Highest education level: Not on file  Occupational History  . Not on file  Tobacco Use  . Smoking status: Never Smoker  . Smokeless tobacco: Never Used  Substance and Sexual Activity  . Alcohol use: Yes    Alcohol/week: 0.0 standard drinks    Comment: 1-2 DRINKS A MONTH   . Drug use: No  . Sexual activity: Yes    Partners: Male    Birth control/protection: I.U.D.    Comment: 1st intercourse- 21, parnters- 3, married- 1 yrs   Other Topics Concern  . Not on file  Social History Narrative   Occupation: unemployed   Married   [place] of 3-4    Pet dogs 2   G3P2         Social Determinants of Health   Financial Resource Strain:   . Difficulty of Paying Living Expenses: Not on file  Food Insecurity:   . Worried About Charity fundraiser in the Last Year: Not on file  .  Ran Out of Food in the Last Year: Not on file  Transportation Needs:   . Lack of Transportation (Medical): Not on file  . Lack of Transportation (Non-Medical): Not on file  Physical Activity:   . Days of Exercise per Week: Not on file  . Minutes of Exercise per Session: Not on file  Stress:   . Feeling of Stress : Not on file  Social Connections:   . Frequency of Communication with Friends and Family: Not on file  . Frequency of Social Gatherings with Friends and Family: Not on file  . Attends Religious Services: Not on file  . Active Member of Clubs or Organizations: Not on file  . Attends Archivist Meetings: Not on file  . Marital Status: Not on file    Outpatient Medications Prior to Visit  Medication Sig Dispense Refill  . cetirizine (ZYRTEC) 10 MG tablet Take 10 mg by mouth daily.    . Melatonin 5 MG TABS Take 5 mg by mouth daily.    . metFORMIN (GLUCOPHAGE-XR) 500 MG 24 hr tablet TAKE 1 TABLET(500 MG) BY MOUTH DAILY. INCREASE TO 2 TABLETS PER DAY WITH MEAL AFTER 1-2 WEEKS 180 tablet 0  .  omeprazole (PRILOSEC OTC) 20 MG tablet Take 20 mg by mouth daily.     . Nutritional Supplements (ESTROVEN EXTRA STRENGTH PO) Take 1 tablet by mouth daily.    . progesterone (PROMETRIUM) 100 MG capsule Take 1 capsule (100 mg total) by mouth at bedtime. (Patient not taking: Reported on 07/23/2019) 90 capsule 4   No facility-administered medications prior to visit.     EXAM:  BP 124/76 (BP Location: Right Arm, Patient Position: Sitting, Cuff Size: Normal)   Pulse 82   Temp 97.9 F (36.6 C) (Temporal)   Wt 224 lb 3.2 oz (101.7 kg)   SpO2 99%   BMI 36.74 kg/m   Body mass index is 36.74 kg/m. Wt Readings from Last 3 Encounters:  12/23/19 224 lb 3.2 oz (101.7 kg)  07/23/19 221 lb 3.2 oz (100.3 kg)  01/02/19 214 lb (97.1 kg)    GENERAL: vitals reviewed and listed above, alert, oriented, appears well hydrated and in no acute distress HEENT: atraumatic, conjunctiva  clear, no obvious abnormalities on inspection of external nose and ears OP : masked.  NECK: no obvious masses on inspection palpation  LUNGS: clear to auscultation bilaterally, no wheezes, rales or rhonchi, good air movement CV: HRRR, no clubbing cyanosis or  peripheral edema nl cap refill  MS: moves all extremities without noticeable focal  abnormality PSYCH: pleasant and cooperative, no obvious depression or anxiety Lab Results  Component Value Date   WBC 6.4 07/16/2019   HGB 14.2 07/16/2019   HCT 41.8 07/16/2019   PLT 254.0 07/16/2019   GLUCOSE 106 (H) 07/16/2019   CHOL 188 07/16/2019   TRIG 124.0 07/16/2019   HDL 59.60 07/16/2019   LDLCALC 104 (H) 07/16/2019   ALT 16 07/16/2019   AST 18 07/16/2019   NA 139 07/16/2019   K 4.2 07/16/2019   CL 105 07/16/2019   CREATININE 0.83 07/16/2019   BUN 18 07/16/2019   CO2 25 07/16/2019   TSH 3.62 07/16/2019   HGBA1C 5.8 (A) 12/23/2019   BP Readings from Last 3 Encounters:  12/23/19 124/76  07/23/19 126/78  01/02/19 124/80    ASSESSMENT AND PLAN:  Discussed the  following assessment and plan:  Hyperglycemia - improved cont metformin and   lsi   - Plan: POCT glycosylated hemoglobin (  Hb A1C)  Medication management  Weight gain - counseled   Underinsured Return in about 6 months (around 06/21/2020) for preventive /cpx and medications if feasible. .  -Patient advised to return or notify health care team  if  new concerns arise.  Patient Instructions  Continue  And reconsider .    Weight management. When economically feasible .    Plan cpx in 6 months.   If  Feasible.      Standley Brooking. Rohail Klees M.D.

## 2019-12-23 ENCOUNTER — Encounter: Payer: Self-pay | Admitting: Internal Medicine

## 2019-12-23 ENCOUNTER — Ambulatory Visit (INDEPENDENT_AMBULATORY_CARE_PROVIDER_SITE_OTHER): Payer: PRIVATE HEALTH INSURANCE | Admitting: Internal Medicine

## 2019-12-23 VITALS — BP 124/76 | HR 82 | Temp 97.9°F | Wt 224.2 lb

## 2019-12-23 DIAGNOSIS — R739 Hyperglycemia, unspecified: Secondary | ICD-10-CM | POA: Diagnosis not present

## 2019-12-23 DIAGNOSIS — R635 Abnormal weight gain: Secondary | ICD-10-CM | POA: Diagnosis not present

## 2019-12-23 DIAGNOSIS — Z598 Other problems related to housing and economic circumstances: Secondary | ICD-10-CM | POA: Diagnosis not present

## 2019-12-23 DIAGNOSIS — Z79899 Other long term (current) drug therapy: Secondary | ICD-10-CM

## 2019-12-23 DIAGNOSIS — Z5989 Other problems related to housing and economic circumstances: Secondary | ICD-10-CM

## 2019-12-23 LAB — POCT GLYCOSYLATED HEMOGLOBIN (HGB A1C): Hemoglobin A1C: 5.8 % — AB (ref 4.0–5.6)

## 2019-12-23 NOTE — Patient Instructions (Addendum)
Continue  And reconsider .    Weight management. When economically feasible .    Plan cpx in 6 months.   If  Feasible.

## 2019-12-25 ENCOUNTER — Encounter: Payer: Self-pay | Admitting: Internal Medicine

## 2020-02-19 ENCOUNTER — Other Ambulatory Visit: Payer: Self-pay | Admitting: Internal Medicine

## 2020-05-17 LAB — LIPID PANEL
Cholesterol: 172 (ref 0–200)
HDL: 60 (ref 35–70)
LDL Cholesterol: 94
Triglycerides: 90 (ref 40–160)

## 2020-05-17 LAB — BASIC METABOLIC PANEL
BUN: 19 (ref 4–21)
CO2: 29 — AB (ref 13–22)
Chloride: 107 (ref 99–108)
Creatinine: 0.9 (ref <=1.1)
Glucose: 105
Potassium: 4.1 (ref 3.4–5.3)
Sodium: 142 (ref 137–147)

## 2020-05-17 LAB — HEPATIC FUNCTION PANEL
ALT: 10 (ref 7–35)
AST: 15 (ref 13–35)
Alkaline Phosphatase: 143 — AB (ref 25–125)
Bilirubin, Total: 0.3

## 2020-05-17 LAB — VITAMIN D 25 HYDROXY (VIT D DEFICIENCY, FRACTURES): Vit D, 25-Hydroxy: 39.7

## 2020-05-17 LAB — COMPREHENSIVE METABOLIC PANEL
Albumin: 4.2 (ref 3.5–5.0)
Calcium: 9.3 (ref 8.7–10.7)
GFR calc Af Amer: 89
GFR calc non Af Amer: 74

## 2020-05-17 LAB — CBC AND DIFFERENTIAL
HCT: 43 (ref 36–46)
Hemoglobin: 14 (ref 12.0–16.0)
Neutrophils Absolute: 63
Platelets: 255 (ref 150–399)
WBC: 6.8

## 2020-05-17 LAB — HEMOGLOBIN A1C: Hemoglobin A1C: 6.1

## 2020-05-17 LAB — IRON,TIBC AND FERRITIN PANEL
Ferritin: 37
Iron: 79

## 2020-05-17 LAB — CBC: RBC: 4.76 (ref 3.87–5.11)

## 2020-06-29 LAB — TESTOSTERONE: Testosterone: 286

## 2020-06-29 LAB — TSH: TSH: 2.45 (ref <=5.90)

## 2020-07-30 ENCOUNTER — Other Ambulatory Visit: Payer: Self-pay

## 2020-08-01 NOTE — Progress Notes (Signed)
Chief Complaint  Patient presents with  . Annual Exam    HPI: Patient  Brandy Maynard  49 y.o. comes in today for Preventive Health Care visit   Still   Battling weight  Some emotioinal eating  Tracks  Phone app still no sig eight loss  ( no os )   [facility]    [facility] and had testosterone implants  And sleeps much  better .  August  Mid   sporadic periods .  Vit  D3   2 per day  Still taking  metformin   At night  So not forgetting   utd on mammo and colon Last pap Jan 2020 will make gyne appt  Got covid vaccine  With reluctance  .  Takes prilosec about 3 d per week   Gets sx if off for weeks at  A time.   Health Maintenance  Topic Date Due  . Hepatitis C Screening  Never done  . HIV Screening  Never done  . COLONOSCOPY  01/13/2019  . PAP SMEAR-Modifier  01/02/2020  . INFLUENZA VACCINE  05/30/2020  . MAMMOGRAM  09/01/2020  . TETANUS/TDAP  04/29/2022  . COVID-19 Vaccine  Completed   Health Maintenance Review LIFESTYLE:  Exercise:   3 d per week   recently   Tobacco/ETS: no Alcohol:  ocass Sugar beverages: no Sleep: much better   Since on hormones .  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 GI /GU: No adominal pain, vomiting, change in bowel habits. No blood in the stool. No significant GU symptoms. SKIN/HEME: ,no acute skin rashes suspicious lesions or bleeding. No lymphadenopathy, nodules, masses.  NEURO/ PSYCH:  No neurologic signs such as weakness numbness. No depression anxiety. IMM/ Allergy: No unusual infections.  Allergy .   REST of 12 system review negative except as per HPI   Past Medical History:  Diagnosis Date  . Allergy   . Anemia   . ANEMIA 12/15/2009   Qualifier: Diagnosis of  By: Regis Bill MD,  Standley Brooking   . Depression   . GERD (gastroesophageal reflux disease)   . Heavy menses    on ocps  . IBS (irritable bowel syndrome)    by hx and had a neg colonscopy  . Migraines     Past Surgical History:  Procedure Laterality Date  . CHOLECYSTECTOMY    . COLONOSCOPY  08/03/2005  . COLONOSCOPY W/ BIOPSIES  Nov 12 2015  . ESOPHAGOGASTRODUODENOSCOPY ENDOSCOPY  Nov 12 2015  . FOOT SURGERY    . KNEE ARTHROSCOPY  2012   right knee,,, left knee at 15  . LIGAMENT REPAIR  2002   Rt ankle  . PELVIC LAPAROSCOPY  10/11/2005   LAPAROTOMY, LYSIS OF PELVIC ADHESIONS, RSO AND CAUTERIZATION AND TRANSECTION OF LEFT FALLOPIAN TUBE  . PLICA, arthritis and cartilage repair  01/11/2011   on Rt knee  . TUBAL LIGATION  09/2005   scar tissue removal ovarian cyst    Family History  Problem Relation Age of Onset  . Arthritis Mother   . Hypertension Mother   . Cancer Mother        MELANOMA  . Hyperlipidemia Mother   .  Hypertension Maternal Grandmother   . Dementia Maternal Grandmother   . Hypertension Maternal Grandfather   . Cancer Maternal Grandfather        MELANOMA  . Heart disease Paternal Grandmother   . Diabetes Paternal Grandfather   . Heart disease Paternal Grandfather   . Heart disease Father   . Cancer - Ovarian Other        Mat Great Grandmother    Social History   Socioeconomic History  . Marital status: Married    Spouse name: Not on file  . Number of children: Not on file  . Years of education: Not on file  . Highest education level: Not on file  Occupational History  . Not on file  Tobacco Use  . Smoking status: Never Smoker  . Smokeless tobacco: Never Used  Vaping Use  . Vaping Use: Never used  Substance and Sexual Activity  . Alcohol use: Yes    Alcohol/week: 0.0 standard drinks    Comment: 1-2 DRINKS A MONTH   . Drug use: No  . Sexual activity: Yes    Partners: Male    Birth control/protection: I.U.D.    Comment: 1st intercourse- 76, parnters- 3, married-  9 yrs   Other Topics Concern  . Not on file  Social History Narrative   Occupation: unemployed   Married   [place] of 3-4    Pet dogs 2   G3P2         Social Determinants of Health   Financial Resource Strain:   . Difficulty of Paying Living Expenses: Not on file  Food Insecurity:   . Worried About Charity fundraiser in the Last Year: Not on file  . Ran Out of Food in the Last Year: Not on file  Transportation Needs:   . Lack of Transportation (Medical): Not on file  . Lack of Transportation (Non-Medical): Not on file  Physical Activity:   . Days of Exercise per Week: Not on file  . Minutes of Exercise per Session: Not on file  Stress:   . Feeling of Stress : Not on file  Social Connections:   . Frequency of Communication with Friends and Family: Not on file  . Frequency of Social Gatherings with Friends and Family: Not on file  . Attends Religious Services: Not on file  . Active Member of Clubs or Organizations: Not on file  . Attends Archivist Meetings: Not on file  . Marital Status: Not on file    Outpatient Medications Prior to Visit  Medication Sig Dispense Refill  . cetirizine (ZYRTEC) 10 MG tablet Take 10 mg by mouth daily.    . Cholecalciferol (VITAMIN D3 PO) Take by mouth.    . fluticasone (FLONASE) 50 MCG/ACT nasal spray Place into both nostrils as needed for allergies or rhinitis.    . Melatonin 5 MG TABS Take 5 mg by mouth daily.    . metFORMIN (GLUCOPHAGE-XR) 500 MG 24 hr tablet Take 1 tablet (500 mg total) by mouth 2 (two) times daily with a meal. 180 tablet 1  . omeprazole (PRILOSEC OTC) 20 MG tablet Take 20 mg by mouth daily.     . Probiotic Product (PROBIOTIC DAILY PO) Take by mouth.    . TESTOSTERONE NA Place into the nose. Pellets Inserted right hip 04/2020     No facility-administered medications prior to visit.     EXAM:  BP 112/80   Pulse 90   Ht 5\' 6"  (1.676 m)  Wt 224 lb (101.6 kg)   LMP  (LMP Unknown)   SpO2 98%   BMI 36.15  kg/m   Body mass index is 36.15 kg/m. Wt Readings from Last 3 Encounters:  08/02/20 224 lb (101.6 kg)  12/23/19 224 lb 3.2 oz (101.7 kg)  07/23/19 221 lb 3.2 oz (100.3 kg)    Physical Exam: Vital signs reviewed IRS:WNIO is a well-developed well-nourished alert cooperative    who appearsr stated age in no acute distress.  HEENT: normocephalic atraumatic , Eyes: PERRL EOM's full, conjunctiva clear, Nares: paten,t no deformity discharge or tenderness., Ears: no deformity EAC's clear TMs with normal landmarks. Mouth:masked NECK: supple without masses, thyromegaly or bruits. CHEST/PULM:  Clear to auscultation and percussion breath sounds equal no wheeze , rales or rhonchi. No chest wall deformities or tenderness. Breast:deferred  Per gyne . CV: PMI is nondisplaced, S1 S2 no gallops, murmurs, rubs. Peripheral pulses are full without delay.No JVD .  ABDOMEN: Bowel sounds normal nontender  No guard or rebound, no hepato splenomegal no CVA tenderness.  No hernia. Extremtities:  No clubbing cyanosis or edema, no acute joint swelling or redness no focal atrophy NEURO:  Oriented x3, cranial nerves 3-12 appear to be intact, no obvious focal weakness,gait within normal limits no abnormal reflexes or asymmetrical SKIN: No acute rashes normal turgor, color, no bruising or petechiae. PSYCH: Oriented, good eye contact, no obvious depression anxiety, cognition and judgment appear normal. LN: no cervical axillary inguinal adenopathy  Lab Results  Component Value Date   WBC 6.4 07/16/2019   HGB 14.2 07/16/2019   HCT 41.8 07/16/2019   PLT 254.0 07/16/2019   GLUCOSE 106 (H) 07/16/2019   CHOL 188 07/16/2019   TRIG 124.0 07/16/2019   HDL 59.60 07/16/2019   LDLCALC 104 (H) 07/16/2019   ALT 16 07/16/2019   AST 18 07/16/2019   NA 139 07/16/2019   K 4.2 07/16/2019   CL 105 07/16/2019   CREATININE 0.83 07/16/2019   BUN 18 07/16/2019   CO2 25 07/16/2019   TSH 3.62 07/16/2019   HGBA1C 5.8 (A) 12/23/2019     BP Readings from Last 3 Encounters:  08/02/20 112/80  12/23/19 124/76  07/23/19 126/78    Lab plan reviewed with patient  [facility] nad says a1c in low 6 range  And lipids  Were ok  tg at goal  Colon pap  Due? ASSESSMENT AND PLAN:  Discussed the following assessment and plan:    ICD-10-CM   1. Visit for preventive health examination  Z00.00   2. Preventative health care  Z00.00   3. Hyperglycemia  R73.9   4. BMI 35.0-35.9,adult  Z68.35   disc  glp 1 meds   Including rybelsus  ozempic trulicity inj  She would be a candidate   She is already   Organized the lsi  Etc  .   Get [place] lab result  Let us know if iwshes to try  Med class Return for depending  or 6 months .  Patient Care Team: Somtochukwu Woollard, Standley Brooking, MD as PCP - Lorenza Cambridge, MD (Orthopedic Surgery) Jerrell Belfast, MD (Otolaryngology) Princess Bruins, MD as Consulting Physician (Obstetrics and Gynecology) Patient Instructions  Continue lifestyle intervention healthy eating and exercise .   Can consider   meds such as ozempic,  rybelsus,  trulicity . To help.  Blood sugar .   Get [place] copy of  Labs done [facility]. Let me know if you want to try  Medication Get me a message either way in a month or so .      Health Maintenance, Female Adopting a healthy lifestyle and getting preventive care are important in promoting health and wellness. Ask your health care provider about:  The right schedule for you to have regular tests and exams.  Things you can do on your own to prevent diseases and keep yourself healthy. What should I know about diet, weight, and exercise? Eat a healthy diet   Eat a diet that includes plenty of vegetables, fruits, low-fat dairy products, and lean protein.  Do not eat a lot of foods that are high in solid fats, added sugars, or sodium. Maintain a healthy weight Body mass index (BMI) is used to identify weight problems. It estimates body fat based on height and weight.  Your health care provider can help determine your BMI and help you achieve or maintain a healthy weight. Get regular exercise Get regular exercise. This is one of the most important things you can do for your health. Most adults should:  Exercise for at least 150 minutes each week. The exercise should increase your heart rate and make you sweat (moderate-intensity exercise).  Do strengthening exercises at least twice a week. This is in addition to the moderate-intensity exercise.  Spend less time sitting. Even light physical activity can be beneficial. Watch cholesterol and blood lipids Have your blood tested for lipids and cholesterol at 51 years of age, then have this test every 5 years. Have your cholesterol levels checked more often if:  Your lipid or cholesterol levels are high.  You are older than 51 years of age.  You are at high risk for heart disease. What should I know about cancer screening? Depending on your health history and family history, you may need to have cancer screening at various ages. This may include screening for:  Breast cancer.  Cervical cancer.  Colorectal cancer.  Skin cancer.  Lung cancer. What should I know about heart disease, diabetes, and high blood pressure? Blood pressure and heart disease  High blood pressure causes heart disease and increases the risk of stroke. This is more likely to develop in people who have high blood pressure readings, are of African descent, or are overweight.  Have your blood pressure checked: ? Every 3-5 years if you are 36-37 years of age. ? Every year if you are 59 years old or older. Diabetes Have regular diabetes screenings. This checks your fasting blood sugar level. Have the screening done:  Once every three years after age 39 if you are at a normal weight and have a low risk for diabetes.  More often and at a younger age if you are overweight or have a high risk for diabetes. What should I know about  preventing infection? Hepatitis B If you have a higher risk for hepatitis B, you should be screened for this virus. Talk with your health care provider to find out if you are at risk for hepatitis B infection. Hepatitis C Testing is recommended for:  Everyone born from [birth year] through 1965.  Anyone with known risk factors for hepatitis C. Sexually transmitted infections (STIs)  Get screened for STIs, including gonorrhea and chlamydia, if: ? You are sexually active and are younger than 51 years of age. ? You are older than 51 years of age and your health care provider tells you that you are at risk for this type of infection. ? Your sexual activity has changed since you  were last screened, and you are at increased risk for chlamydia or gonorrhea. Ask your health care provider if you are at risk.  Ask your health care provider about whether you are at high risk for HIV. Your health care provider may recommend a prescription medicine to help prevent HIV infection. If you choose to take medicine to prevent HIV, you should first get tested for HIV. You should then be tested every 3 months for as long as you are taking the medicine. Pregnancy  If you are about to stop having your period (premenopausal) and you may become pregnant, seek counseling before you get pregnant.  Take 400 to 800 micrograms (mcg) of folic acid every day if you become pregnant.  Ask for birth control (contraception) if you want to prevent pregnancy. Osteoporosis and menopause Osteoporosis is a disease in which the bones lose minerals and strength with aging. This can result in bone fractures. If you are 72 years old or older, or if you are at risk for osteoporosis and fractures, ask your health care provider if you should:  Be screened for bone loss.  Take a calcium or vitamin D supplement to lower your risk of fractures.  Be given hormone replacement therapy (HRT) to treat symptoms of menopause. Follow these  instructions at home: Lifestyle  Do not use any products that contain nicotine or tobacco, such as cigarettes, e-cigarettes, and chewing tobacco. If you need help quitting, ask your health care provider.  Do not use street drugs.  Do not share needles.  Ask your health care provider for help if you need support or information about quitting drugs. Alcohol use  Do not drink alcohol if: ? Your health care provider tells you not to drink. ? You are pregnant, may be pregnant, or are planning to become pregnant.  If you drink alcohol: ? Limit how much you use to 0-1 drink a day. ? Limit intake if you are breastfeeding.  Be aware of how much alcohol is in your drink. In the [place] one drink equals one 12 oz bottle of beer (355 mL), one 5 oz glass of wine (148 mL), or one 1 oz glass of hard liquor (44 mL). General instructions  Schedule regular health, dental, and eye exams.  Stay current with your vaccines.  Tell your health care provider if: ? You often feel depressed. ? You have ever been abused or do not feel safe at home. Summary  Adopting a healthy lifestyle and getting preventive care are important in promoting health and wellness.  Follow your health care provider's instructions about healthy diet, exercising, and getting tested or screened for diseases.  Follow your health care provider's instructions on monitoring your cholesterol and blood pressure. This information is not intended to replace advice given to you by your health care provider. Make sure you discuss any questions you have with your health care provider. Document Revised: 10/09/2018 Document Reviewed: 10/09/2018 Elsevier Patient Education  2020 [place] Justan Gaede M.D.

## 2020-08-02 ENCOUNTER — Encounter: Payer: Self-pay | Admitting: Internal Medicine

## 2020-08-02 ENCOUNTER — Ambulatory Visit (INDEPENDENT_AMBULATORY_CARE_PROVIDER_SITE_OTHER): Payer: 59 | Admitting: Internal Medicine

## 2020-08-02 ENCOUNTER — Other Ambulatory Visit: Payer: Self-pay

## 2020-08-02 VITALS — BP 112/80 | HR 90 | Ht 66.0 in | Wt 224.0 lb

## 2020-08-02 DIAGNOSIS — R739 Hyperglycemia, unspecified: Secondary | ICD-10-CM

## 2020-08-02 DIAGNOSIS — Z0000 Encounter for general adult medical examination without abnormal findings: Secondary | ICD-10-CM

## 2020-08-02 DIAGNOSIS — Z6835 Body mass index (BMI) 35.0-35.9, adult: Secondary | ICD-10-CM

## 2020-08-02 NOTE — Patient Instructions (Signed)
Continue lifestyle intervention healthy eating and exercise .   Can consider   meds such as ozempic,  rybelsus,  trulicity . To help.  Blood sugar .   Get [place] copy of  Labs done at [facility]. Let me know if you want to try  Medication Get me a message either way in a month or so .      Health Maintenance, Female Adopting a healthy lifestyle and getting preventive care are important in promoting health and wellness. Ask your health care provider about:  The right schedule for you to have regular tests and exams.  Things you can do on your own to prevent diseases and keep yourself healthy. What should I know about diet, weight, and exercise? Eat a healthy diet   Eat a diet that includes plenty of vegetables, fruits, low-fat dairy products, and lean protein.  Do not eat a lot of foods that are high in solid fats, added sugars, or sodium. Maintain a healthy weight Body mass index (BMI) is used to identify weight problems. It estimates body fat based on height and weight. Your health care provider can help determine your BMI and help you achieve or maintain a healthy weight. Get regular exercise Get regular exercise. This is one of the most important things you can do for your health. Most adults should:  Exercise for at least 150 minutes each week. The exercise should increase your heart rate and make you sweat (moderate-intensity exercise).  Do strengthening exercises at least twice a week. This is in addition to the moderate-intensity exercise.  Spend less time sitting. Even light physical activity can be beneficial. Watch cholesterol and blood lipids Have your blood tested for lipids and cholesterol at 51 years of age, then have this test every 5 years. Have your cholesterol levels checked more often if:  Your lipid or cholesterol levels are high.  You are older than 51 years of age.  You are at high risk for heart disease. What should I know about cancer screening? Depending  on your health history and family history, you may need to have cancer screening at various ages. This may include screening for:  Breast cancer.  Cervical cancer.  Colorectal cancer.  Skin cancer.  Lung cancer. What should I know about heart disease, diabetes, and high blood pressure? Blood pressure and heart disease  High blood pressure causes heart disease and increases the risk of stroke. This is more likely to develop in people who have high blood pressure readings, are of African descent, or are overweight.  Have your blood pressure checked: ? Every 3-5 years if you are 41-27 years of age. ? Every year if you are 32 years old or older. Diabetes Have regular diabetes screenings. This checks your fasting blood sugar level. Have the screening done:  Once every three years after age 19 if you are at a normal weight and have a low risk for diabetes.  More often and at a younger age if you are overweight or have a high risk for diabetes. What should I know about preventing infection? Hepatitis B If you have a higher risk for hepatitis B, you should be screened for this virus. Talk with your health care provider to find out if you are at risk for hepatitis B infection. Hepatitis C Testing is recommended for:  Everyone born from [birth year] through 1965.  Anyone with known risk factors for hepatitis C. Sexually transmitted infections (STIs)  Get screened for STIs, including gonorrhea and  chlamydia, if: ? You are sexually active and are younger than 52 years of age. ? You are older than 51 years of age and your health care provider tells you that you are at risk for this type of infection. ? Your sexual activity has changed since you were last screened, and you are at increased risk for chlamydia or gonorrhea. Ask your health care provider if you are at risk.  Ask your health care provider about whether you are at high risk for HIV. Your health care provider may recommend a  prescription medicine to help prevent HIV infection. If you choose to take medicine to prevent HIV, you should first get tested for HIV. You should then be tested every 3 months for as long as you are taking the medicine. Pregnancy  If you are about to stop having your period (premenopausal) and you may become pregnant, seek counseling before you get pregnant.  Take 400 to 800 micrograms (mcg) of folic acid every day if you become pregnant.  Ask for birth control (contraception) if you want to prevent pregnancy. Osteoporosis and menopause Osteoporosis is a disease in which the bones lose minerals and strength with aging. This can result in bone fractures. If you are 42 years old or older, or if you are at risk for osteoporosis and fractures, ask your health care provider if you should:  Be screened for bone loss.  Take a calcium or vitamin D supplement to lower your risk of fractures.  Be given hormone replacement therapy (HRT) to treat symptoms of menopause. Follow these instructions at home: Lifestyle  Do not use any products that contain nicotine or tobacco, such as cigarettes, e-cigarettes, and chewing tobacco. If you need help quitting, ask your health care provider.  Do not use street drugs.  Do not share needles.  Ask your health care provider for help if you need support or information about quitting drugs. Alcohol use  Do not drink alcohol if: ? Your health care provider tells you not to drink. ? You are pregnant, may be pregnant, or are planning to become pregnant.  If you drink alcohol: ? Limit how much you use to 0-1 drink a day. ? Limit intake if you are breastfeeding.  Be aware of how much alcohol is in your drink. In the [place] one drink equals one 12 oz bottle of beer (355 mL), one 5 oz glass of wine (148 mL), or one 1 oz glass of hard liquor (44 mL). General instructions  Schedule regular health, dental, and eye exams.  Stay current with your  vaccines.  Tell your health care provider if: ? You often feel depressed. ? You have ever been abused or do not feel safe at home. Summary  Adopting a healthy lifestyle and getting preventive care are important in promoting health and wellness.  Follow your health care provider's instructions about healthy diet, exercising, and getting tested or screened for diseases.  Follow your health care provider's instructions on monitoring your cholesterol and blood pressure. This information is not intended to replace advice given to you by your health care provider. Make sure you discuss any questions you have with your health care provider. Document Revised: 10/09/2018 Document Reviewed: 10/09/2018 Elsevier Patient Education  2020 [employer].

## 2020-08-03 NOTE — Telephone Encounter (Signed)
Thanks for the information

## 2020-08-12 MED ORDER — RYBELSUS 3 MG PO TABS: 3.0000 mg | ORAL_TABLET | Freq: Every day | ORAL | 1 refills | Status: DC

## 2020-08-13 MED ORDER — RYBELSUS 3 MG PO TABS: 3.0000 mg | ORAL_TABLET | Freq: Every day | ORAL | 1 refills | Status: DC

## 2020-08-13 NOTE — Addendum Note (Signed)
Addended by: Rodrigo Ran on: 08/13/2020 08:13 AM   Modules accepted: Orders

## 2020-08-18 ENCOUNTER — Ambulatory Visit: Payer: 59 | Admitting: Obstetrics & Gynecology

## 2020-08-18 ENCOUNTER — Other Ambulatory Visit: Payer: Self-pay

## 2020-08-18 ENCOUNTER — Encounter: Payer: Self-pay | Admitting: Obstetrics & Gynecology

## 2020-08-18 DIAGNOSIS — N951 Menopausal and female climacteric states: Secondary | ICD-10-CM

## 2020-08-18 DIAGNOSIS — R102 Pelvic and perineal pain: Secondary | ICD-10-CM

## 2020-08-18 DIAGNOSIS — N898 Other specified noninflammatory disorders of vagina: Secondary | ICD-10-CM | POA: Diagnosis not present

## 2020-08-18 LAB — WET PREP FOR TRICH, YEAST, CLUE

## 2020-08-18 MED ORDER — FLUCONAZOLE 150 MG PO TABS: 150.0000 mg | ORAL_TABLET | Freq: Every day | ORAL | 2 refills | Status: AC

## 2020-08-18 NOTE — Progress Notes (Signed)
    Brandy Maynard 1971/07/01 [id number]        49 y.o.  [id number] married  RP: Vaginal itching and pelvic discomfort for 6 days  HPI: Vaginal itching with discharge and pelvic discomfort for 6 days.  104 2 old tablets of fluconazole that she had in her pharmacy for more than 3 years on Friday and Sunday.  No improvement.  No UTI symptoms otherwise.  Declines STI screening.  Postmenopausal with no postmenopausal bleeding.  Received testosterone pellets with integrative medicine and had a testosterone level of 286 a month ago.  Patient reports that the testosterone pellets have helped her with her hot flashes and night sweats as well as her joint pains.  OB History  Gravida Para Term Preterm AB Living  3 2 2   1 2   SAB TAB Ectopic Multiple Live Births  1       2    # Outcome Date GA Lbr Len/2nd Weight Sex Delivery Anes PTL Lv  3 SAB           2 Term     F Vag-Spont  N LIV  1 Term     F Vag-Spont  N LIV    Past medical history,surgical history, problem list, medications, allergies, family history and social history were all reviewed and documented in the EPIC chart.   Directed ROS with pertinent positives and negatives documented in the history of present illness/assessment and plan.  Exam:  There were no vitals filed for this visit. General appearance:  Normal  Abdomen: Normal  Gynecologic exam: Vulva normal.  Speculum: cervix/Vagina normal.  Increased vaginal d/c.  Wet prep done.  Wet prep:  Yeasts present U/A: Yellow clear, protein negative, nitrites negative, white blood cells 0-5, red blood cells 0-2, few bacteria.  Assessment/Plan:  49 y.o. G8T1572   1. Vaginal discharge Yeast vaginitis confirmed by wet prep.  Management reviewed.  Decision to treat with fluconazole 150 mg/tab, 1 tablet per mouth daily for 3 days.  Prescription sent to pharmacy with 2 refills.  If no improvement after 2 days, will consider treatment with Terazol 3 cream. - WET PREP FOR [place], YEAST,  CLUE  2. Abdominal pain, suprapubic Mild pelvic cramping yesterday.  Postmenopausal with no postmenopausal bleeding.  Declines STI screen.  No UTI symptoms.  Urine analysis normal except for few bacteria.  Will wait on urine culture to decide if treatment is needed. - Urinalysis,Complete w/RFL Culture  3. Menopausal syndrome Received testosterone pellets by integrative medicine with a testosterone level of 286 a month ago.  Symptoms of hot flushes, night sweats and joint pains controlled since then.  Given the very high testosterone level, recommend not receiving testosterone pellets anymore.  Patient will follow up with me for annual gynecologic exam in December 2021 and we will reassess management per symptoms at that time.  Other orders - fluconazole (DIFLUCAN) 150 MG tablet; Take 1 tablet (150 mg total) by mouth daily for 3 days.  Princess Bruins MD, 12:53 PM 08/18/2020

## 2020-08-20 LAB — URINALYSIS, COMPLETE W/RFL CULTURE
Bilirubin Urine: NEGATIVE
Glucose, UA: NEGATIVE
Hyaline Cast: NONE SEEN /LPF
Ketones, ur: NEGATIVE
Leukocyte Esterase: NEGATIVE
Nitrites, Initial: NEGATIVE
Protein, ur: NEGATIVE
Specific Gravity, Urine: 1.025 (ref 1.001–1.03)
pH: 6 (ref 5.0–8.0)

## 2020-08-20 LAB — URINE CULTURE
MICRO NUMBER:: 11095870
SPECIMEN QUALITY:: ADEQUATE

## 2020-08-20 LAB — CULTURE INDICATED

## 2020-09-07 ENCOUNTER — Encounter: Payer: Self-pay | Admitting: Obstetrics & Gynecology

## 2020-09-27 NOTE — Telephone Encounter (Signed)
Sorry are not tolerating the medication.   You could take with some  Mild or light food and see if  Nausea goes away . Otherwise can stop as you see fit.  Did you want to try weight management as a referral  Again  Are  You still on the list waiting list?    In  regard to palpitations   and change in exercise tolerance I agree would advise more evaluation.    This would best be done through cardiology consult.  Consideration of an echocardiogram and a stress test..  I advise referral   To cardiology but if having emergent new sx contact medical team for morre urgent   Evaluation  Ed etc.   If patient agrees please  Place a cardiology referral for palpations and exercise intolerance

## 2020-10-19 ENCOUNTER — Ambulatory Visit: Payer: 59 | Admitting: Obstetrics & Gynecology

## 2020-10-19 ENCOUNTER — Other Ambulatory Visit: Payer: Self-pay

## 2020-10-19 ENCOUNTER — Encounter: Payer: Self-pay | Admitting: Obstetrics & Gynecology

## 2020-10-19 VITALS — BP 126/80 | Ht 66.0 in | Wt 223.0 lb

## 2020-10-19 DIAGNOSIS — Z6835 Body mass index (BMI) 35.0-35.9, adult: Secondary | ICD-10-CM | POA: Diagnosis not present

## 2020-10-19 DIAGNOSIS — E6609 Other obesity due to excess calories: Secondary | ICD-10-CM

## 2020-10-19 DIAGNOSIS — Z01419 Encounter for gynecological examination (general) (routine) without abnormal findings: Secondary | ICD-10-CM

## 2020-10-19 DIAGNOSIS — N951 Menopausal and female climacteric states: Secondary | ICD-10-CM | POA: Diagnosis not present

## 2020-10-19 MED ORDER — PROGESTERONE MICRONIZED 100 MG PO CAPS: 100.0000 mg | ORAL_CAPSULE | Freq: Every day | ORAL | 4 refills | Status: DC

## 2020-10-19 NOTE — Progress Notes (Signed)
Brandy Maynard [birth year] [id number]   History:    49 y.o. G26P2A1L2  Married  RP:  Established patient presenting for annual gyn exam   HPI:  Perimenopause, oligomenorrhea.  LMP 10/03/2020 which lasted 12 days with moderate flow.  Hot flushes and night sweats on-off helped with Estroven. No pelvic pain.  No pain with IC.  BMI 35.99.  Health labs with Fam MD.  Brandy Maynard 2018 Benign polyps.   Past medical history,surgical history, family history and social history were all reviewed and documented in the EPIC chart.  Gynecologic History Patient's last menstrual period was 10/03/2020.  Obstetric History OB History  Gravida Para Term Preterm AB Living  3 2 2   1 2   SAB IAB Ectopic Multiple Live Births  1       2    # Outcome Date GA Lbr Len/2nd Weight Sex Delivery Anes PTL Lv  3 SAB           2 Term     F Vag-Spont  N LIV  1 Term     F Vag-Spont  N LIV     ROS: A ROS was performed and pertinent positives and negatives are included in the history.  GENERAL: No fevers or chills. HEENT: No change in vision, no earache, sore throat or sinus congestion. NECK: No pain or stiffness. CARDIOVASCULAR: No chest pain or pressure. No palpitations. PULMONARY: No shortness of breath, cough or wheeze. GASTROINTESTINAL: No abdominal pain, nausea, vomiting or diarrhea, melena or bright red blood per rectum. GENITOURINARY: No urinary frequency, urgency, hesitancy or dysuria. MUSCULOSKELETAL: No joint or muscle pain, no back pain, no recent trauma. DERMATOLOGIC: No rash, no itching, no lesions. ENDOCRINE: No polyuria, polydipsia, no heat or cold intolerance. No recent change in weight. HEMATOLOGICAL: No anemia or easy bruising or bleeding. NEUROLOGIC: No headache, seizures, numbness, tingling or weakness. PSYCHIATRIC: No depression, no loss of interest in normal activity or change in sleep pattern.     Exam:   BP 126/80 (BP Location: Right Arm, Patient Position: Sitting, Cuff Size: Normal)   Ht 5\' 6"   (1.676 m)   Wt 223 lb (101.2 kg)   LMP 10/03/2020   BMI 35.99 kg/m   Body mass index is 35.99 kg/m.  General appearance : Well developed well nourished female. No acute distress HEENT: Eyes: no retinal hemorrhage or exudates,  Neck supple, trachea midline, no carotid bruits, no thyroidmegaly Lungs: Clear to auscultation, no rhonchi or wheezes, or rib retractions  Heart: Regular rate and rhythm, no murmurs or gallops Breast:Examined in sitting and supine position were symmetrical in appearance, no palpable masses or tenderness,  no skin retraction, no nipple inversion, no nipple discharge, no skin discoloration, no axillary or supraclavicular lymphadenopathy Abdomen: no palpable masses or tenderness, no rebound or guarding Extremities: no edema or skin discoloration or tenderness  Pelvic: Vulva: Normal             Vagina: No gross lesions or discharge  Cervix: No gross lesions or discharge  Uterus  AV, normal size, shape and consistency, non-tender and mobile  Adnexa  Without masses or tenderness  Anus: Normal   Assessment/Plan:  49 y.o. female for annual exam   1. Well female exam with routine gynecological exam Normal gynecologic exam.  Pap test negative in March 2020, no indication to repeat this year.  Breast exam normal.  Screening mammogram November 2021 was negative.  Colonoscopy 2018 showed benign polyps.  Health labs with family physician.  2. Perimenopause Oligomenorrhea with heavy and long.  October 03, 2020.  Check FSH level today.  History of high testosterone will recheck today.  Decision to start on Prometrium 100 mg/caps 1 capsule per mouth at bedtime.  No contraindication.  Usage reviewed and prescription sent to pharmacy. - FSH - Testos,Total,Free and SHBG (Female)  3. Class 2 obesity due to excess calories without serious comorbidity with body mass index (BMI) of 35.0 to 35.9 in adult Recommend a lower calorie/carb diet.  Aerobic activities 5 times a week and  light weightlifting every 2 days.  Other orders - progesterone (PROMETRIUM) 100 MG capsule; Take 1 capsule (100 mg total) by mouth at bedtime.  Princess Bruins MD, 11:41 AM 10/19/2020

## 2020-10-20 ENCOUNTER — Encounter: Payer: Self-pay | Admitting: Obstetrics & Gynecology

## 2020-10-25 LAB — FOLLICLE STIMULATING HORMONE: FSH: 58.6 m[IU]/mL

## 2020-10-25 LAB — TESTOS,TOTAL,FREE AND SHBG (FEMALE)
Free Testosterone: 7.1 pg/mL — ABNORMAL HIGH (ref 0.1–6.4)
Sex Hormone Binding: 61 nmol/L (ref 17–124)
Testosterone, Total, LC-MS-MS: 72 ng/dL — ABNORMAL HIGH (ref 2–45)

## 2020-10-26 ENCOUNTER — Other Ambulatory Visit: Payer: Self-pay | Admitting: *Deleted

## 2020-10-26 DIAGNOSIS — N951 Menopausal and female climacteric states: Secondary | ICD-10-CM

## 2020-10-27 ENCOUNTER — Encounter: Payer: BLUE CROSS/BLUE SHIELD | Admitting: Obstetrics & Gynecology

## 2020-11-03 DIAGNOSIS — Z20822 Contact with and (suspected) exposure to covid-19: Secondary | ICD-10-CM | POA: Diagnosis not present

## 2020-11-08 ENCOUNTER — Telehealth: Payer: Self-pay

## 2020-11-08 ENCOUNTER — Encounter: Payer: Self-pay | Admitting: Internal Medicine

## 2020-11-08 ENCOUNTER — Telehealth (INDEPENDENT_AMBULATORY_CARE_PROVIDER_SITE_OTHER): Payer: BC Managed Care – PPO | Admitting: Internal Medicine

## 2020-11-08 VITALS — HR 72

## 2020-11-08 DIAGNOSIS — J988 Other specified respiratory disorders: Secondary | ICD-10-CM | POA: Diagnosis not present

## 2020-11-08 DIAGNOSIS — R058 Other specified cough: Secondary | ICD-10-CM | POA: Diagnosis not present

## 2020-11-08 DIAGNOSIS — U071 COVID-19: Secondary | ICD-10-CM

## 2020-11-08 MED ORDER — HYDROCODONE-HOMATROPINE 5-1.5 MG/5ML PO SYRP
5.0000 mL | ORAL_SOLUTION | Freq: Four times a day (QID) | ORAL | 0 refills | Status: DC | PRN
Start: 2020-11-08 — End: 2021-01-31

## 2020-11-08 NOTE — Progress Notes (Signed)
Virtual Visit via Video Note  I connected with@ on 11/08/20 at  4:30 PM EST by a video enabled telemedicine application and verified that I am speaking with the correct person using two identifiers. Location patient: home Location provider:work ffice Persons participating in the virtual visit: patient, provider  [place] national recommendations  regarding COVID 19 pandemic   video visit is advised over in office visit for this patient.  Patient aware  of the limitations of evaluation and management by telemedicine and  availability of in person appointments. and agreed to proceed.   HPI: Brandy Maynard presents for video visit see my chart message.  Onset was sore throat January 1 and headache and congestion.  Had a PCR test done on the fifth became positive results given last night.  She developed a cough on January 8 or thereabouts and since that time has had difficulty sleeping at night from the severity of the cough at night.  Still some sore throat headache but that is improved and no more fever pulse ox 96% no shortness of breath using cough drops and a lot of over-the-counter's without relief. Husband well  Daughter newly st  Is isolating  ROS: See pertinent positives and negatives per HPI.  Past Medical History:  Diagnosis Date  . Allergy   . Anemia   . ANEMIA 12/15/2009   Qualifier: Diagnosis of  By: Regis Bill MD, Standley Brooking   . Depression   . GERD (gastroesophageal reflux disease)   . Heavy menses    on ocps  . IBS (irritable bowel syndrome)    by hx and had a neg colonscopy  . Migraines     Past Surgical History:  Procedure Laterality Date  . CHOLECYSTECTOMY    . COLONOSCOPY  08/03/2005  . COLONOSCOPY W/ BIOPSIES  Nov 12 2015  . ESOPHAGOGASTRODUODENOSCOPY ENDOSCOPY  Nov 12 2015  . FOOT SURGERY    . KNEE ARTHROSCOPY  2012   right knee,,, left knee at 15  . LIGAMENT REPAIR  2002   Rt ankle  . PELVIC LAPAROSCOPY  10/11/2005   LAPAROTOMY, LYSIS OF PELVIC ADHESIONS, RSO AND  CAUTERIZATION AND TRANSECTION OF LEFT FALLOPIAN TUBE  . PLICA, arthritis and cartilage repair  01/11/2011   on Rt knee  . TUBAL LIGATION  09/2005   scar tissue removal ovarian cyst    Family History  Problem Relation Age of Onset  . Arthritis Mother   . Hypertension Mother   . Cancer Mother        MELANOMA  . Hyperlipidemia Mother   . Hypertension Maternal Grandmother   . Dementia Maternal Grandmother   . Hypertension Maternal Grandfather   . Cancer Maternal Grandfather        MELANOMA  . Heart disease Paternal Grandmother   . Diabetes Paternal Grandfather   . Heart disease Paternal Grandfather   . Heart disease Father   . Cancer - Ovarian Other        Mat Great Grandmother    Social History   Tobacco Use  . Smoking status: Never Smoker  . Smokeless tobacco: Never Used  Vaping Use  . Vaping Use: Never used  Substance Use Topics  . Alcohol use: Yes    Alcohol/week: 0.0 standard drinks    Comment: 1-2 DRINKS A MONTH   . Drug use: No      Current Outpatient Medications:  .  HYDROcodone-homatropine (HYCODAN) 5-1.5 MG/5ML syrup, Take 5 mLs by mouth every 6 (six) hours as needed for cough (  at night)., Disp: 90 mL, Rfl: 0 .  cetirizine (ZYRTEC) 10 MG tablet, Take 10 mg by mouth daily., Disp: , Rfl:  .  Cholecalciferol (VITAMIN D3 PO), Take by mouth., Disp: , Rfl:  .  fluticasone (FLONASE) 50 MCG/ACT nasal spray, Place into both nostrils as needed for allergies or rhinitis., Disp: , Rfl:  .  Melatonin 5 MG TABS, Take 5 mg by mouth daily., Disp: , Rfl:  .  metFORMIN (GLUCOPHAGE-XR) 500 MG 24 hr tablet, Take 1 tablet (500 mg total) by mouth 2 (two) times daily with a meal., Disp: 180 tablet, Rfl: 1 .  omeprazole (PRILOSEC OTC) 20 MG tablet, Take 20 mg by mouth daily., Disp: , Rfl:  .  Probiotic Product (PROBIOTIC DAILY PO), Take by mouth., Disp: , Rfl:  .  progesterone (PROMETRIUM) 100 MG capsule, Take 1 capsule (100 mg total) by mouth at bedtime., Disp: 90 capsule, Rfl:  4 .  TESTOSTERONE NA, Place into the nose. Pellets Inserted right hip 04/2020, Disp: , Rfl:   EXAM: BP Readings from Last 3 Encounters:  10/19/20 126/80  08/02/20 112/80  12/23/19 124/76    VITALS per patient if applicable:  GENERAL: alert, oriented, appears well and in no acute distress  HEENT: atraumatic, conjunttiva clear, no obvious abnormalities on inspection of external nose and ears  NECK: normal movements of the head and neck  LUNGS: on inspection no signs of respiratory distress, breathing rate appears normal, no obvious gross SOB, gasping or wheezing  CV: no obvious cyanosis  MS: moves all visible extremities without noticeable abnormality  PSYCH/NEURO: pleasant and cooperative, no obvious depression or anxiety, speech and thought processing grossly intact   ASSESSMENT AND PLAN:  Discussed the following assessment and plan:    ICD-10-CM   1. Respiratory tract infection due to COVID-19 virus  U07.1    J98.8   2. Respiratory tract congestion with cough  R05.8     Counseled.  Pt aware  options discussed  Tessalon not helpful for her   Will try hycodan ( able to take small amounts codiene  Itching side effect  Not allergic)  If so stop   Continue airway  Hygiene   Expectant management and discussion of plan and treatment with opportunity to ask questions and all were answered. The patient agreed with the plan and demonstrated an understanding of the instructions.   Advised to call back or seek an in-person evaluation if worsening  or having  further concerns . Return if symptoms worsen or fail to improve as expected.    Shanon Ace, MD

## 2020-11-08 NOTE — Telephone Encounter (Signed)
Can someone please call and schedule an appointment for this patient.   This is Dr. Regis Bill message.          I would like to takd with her   Can you  Have her make a virtual visit  ( add on around 4 30 p,m make a  Slot so I can discuss  Which and what med to try  And   Expectant management.

## 2020-11-08 NOTE — Telephone Encounter (Signed)
I would like to takd with her   Can you  Have her make a virtual visit  ( add on around 4 30 p,m make a  Slot so I can discuss  Which and what med to try  And   Expectant management.

## 2021-01-19 DIAGNOSIS — Z03818 Encounter for observation for suspected exposure to other biological agents ruled out: Secondary | ICD-10-CM | POA: Diagnosis not present

## 2021-01-19 DIAGNOSIS — Z20822 Contact with and (suspected) exposure to covid-19: Secondary | ICD-10-CM | POA: Diagnosis not present

## 2021-01-26 DIAGNOSIS — L814 Other melanin hyperpigmentation: Secondary | ICD-10-CM | POA: Diagnosis not present

## 2021-01-26 DIAGNOSIS — Z86018 Personal history of other benign neoplasm: Secondary | ICD-10-CM | POA: Diagnosis not present

## 2021-01-26 DIAGNOSIS — D225 Melanocytic nevi of trunk: Secondary | ICD-10-CM | POA: Diagnosis not present

## 2021-01-26 DIAGNOSIS — L578 Other skin changes due to chronic exposure to nonionizing radiation: Secondary | ICD-10-CM | POA: Diagnosis not present

## 2021-01-31 ENCOUNTER — Other Ambulatory Visit: Payer: Self-pay

## 2021-01-31 ENCOUNTER — Encounter: Payer: Self-pay | Admitting: Internal Medicine

## 2021-01-31 ENCOUNTER — Ambulatory Visit: Payer: BC Managed Care – PPO | Admitting: Internal Medicine

## 2021-01-31 VITALS — BP 116/78 | HR 69 | Temp 98.5°F | Ht 66.0 in | Wt 207.8 lb

## 2021-01-31 DIAGNOSIS — R739 Hyperglycemia, unspecified: Secondary | ICD-10-CM

## 2021-01-31 DIAGNOSIS — E669 Obesity, unspecified: Secondary | ICD-10-CM

## 2021-01-31 DIAGNOSIS — Z6833 Body mass index (BMI) 33.0-33.9, adult: Secondary | ICD-10-CM | POA: Diagnosis not present

## 2021-01-31 DIAGNOSIS — Z79899 Other long term (current) drug therapy: Secondary | ICD-10-CM | POA: Diagnosis not present

## 2021-01-31 LAB — POCT GLYCOSYLATED HEMOGLOBIN (HGB A1C): Hemoglobin A1C: 5.7 % — AB (ref 4.0–5.6)

## 2021-01-31 NOTE — Progress Notes (Signed)
Chief Complaint  Patient presents with  . Follow-up    HPI: Brandy Maynard 50 y.o. come in for follow-up with hyperglycemia and obesity.  Had covid jan 22  Had no success was multiple interventions so signed up for [facility] program $400 a month 5 1 protein and greens  1 meal a day and then 5 balance supplements she has been able to lose 17 pounds went on a 4-day cruise just got back and has not gained significant weight back  This program has decreased her craving for sugar which has been also helpful in no carb snacks etc. She feels more energetic when mowing the lawn doing other activities.  ROS: See pertinent positives and negatives per HPI.  Past Medical History:  Diagnosis Date  . Allergy   . Anemia   . ANEMIA 12/15/2009   Qualifier: Diagnosis of  By: Regis Bill MD, Standley Brooking   . Depression   . GERD (gastroesophageal reflux disease)   . Heavy menses    on ocps  . IBS (irritable bowel syndrome)    by hx and had a neg colonscopy  . Migraines     Family History  Problem Relation Age of Onset  . Arthritis Mother   . Hypertension Mother   . Cancer Mother        MELANOMA  . Hyperlipidemia Mother   . Hypertension Maternal Grandmother   . Dementia Maternal Grandmother   . Hypertension Maternal Grandfather   . Cancer Maternal Grandfather        MELANOMA  . Heart disease Paternal Grandmother   . Diabetes Paternal Grandfather   . Heart disease Paternal Grandfather   . Heart disease Father   . Cancer - Ovarian Other        Mat Great Grandmother    Social History   Socioeconomic History  . Marital status: Married    Spouse name: Not on file  . Number of children: Not on file  . Years of education: Not on file  . Highest education level: Not on file  Occupational History  . Not on file  Tobacco Use  . Smoking status: Never Smoker  . Smokeless tobacco: Never Used  Vaping Use  . Vaping Use: Never used  Substance and Sexual Activity  . Alcohol use: Yes     Alcohol/week: 0.0 standard drinks    Comment: 1-2 DRINKS A MONTH   . Drug use: No  . Sexual activity: Yes    Partners: Male    Comment: 1st intercourse- 85, parnters- 3, married- 78 yrs   Other Topics Concern  . Not on file  Social History Narrative   Occupation: unemployed   Married   [place] of 3-4    Pet dogs 2   G3P2         Social Determinants of Radio broadcast assistant Strain: Not on file  Food Insecurity: Not on file  Transportation Needs: Not on file  Physical Activity: Not on file  Stress: Not on file  Social Connections: Not on file    Outpatient Medications Prior to Visit  Medication Sig Dispense Refill  . cetirizine (ZYRTEC) 10 MG tablet Take 10 mg by mouth daily.    . Cholecalciferol (VITAMIN D3 PO) Take by mouth.    . fluticasone (FLONASE) 50 MCG/ACT nasal spray Place into both nostrils as needed for allergies or rhinitis.    . Melatonin 5 MG TABS Take 5 mg by mouth daily.    . metFORMIN (GLUCOPHAGE-XR)  500 MG 24 hr tablet Take 1 tablet (500 mg total) by mouth 2 (two) times daily with a meal. 180 tablet 1  . omeprazole (PRILOSEC OTC) 20 MG tablet Take 20 mg by mouth.    . Probiotic Product (PROBIOTIC DAILY PO) Take by mouth.    . progesterone (PROMETRIUM) 100 MG capsule Take 1 capsule (100 mg total) by mouth at bedtime. 90 capsule 4  . HYDROcodone-homatropine (HYCODAN) 5-1.5 MG/5ML syrup Take 5 mLs by mouth every 6 (six) hours as needed for cough (at night). 90 mL 0  . TESTOSTERONE NA Place into the nose. Pellets Inserted right hip 04/2020     No facility-administered medications prior to visit.     EXAM:  BP 116/78 (BP Location: Right Arm, Patient Position: Sitting, Cuff Size: Large)   Pulse 69   Temp 98.5 F (36.9 C) (Oral)   Ht 5\' 6"  (1.676 m)   Wt 207 lb 12.8 oz (94.3 kg)   SpO2 99%   BMI 33.54 kg/m   Body mass index is 33.54 kg/m. Wt Readings from Last 3 Encounters:  01/31/21 207 lb 12.8 oz (94.3 kg)  10/19/20 223 lb (101.2 kg)  08/02/20  224 lb (101.6 kg)    GENERAL: vitals reviewed and listed above, alert, oriented, appears well hydrated and in no acute distress HEENT: atraumatic, conjunctiva  clear, no obvious abnormalities on inspection of external nose and ears OP : Masked NECK: no obvious masses on inspection LUNGS: No dyspnea on exam CV: HRRR,  MS: moves all extremities without noticeable focal  abnormality PSYCH: pleasant and cooperative, no obvious depression or anxiety Lab Results  Component Value Date   WBC 6.8 05/17/2020   HGB 14.0 05/17/2020   HCT 43 05/17/2020   PLT 255 05/17/2020   GLUCOSE 106 (H) 07/16/2019   CHOL 172 05/17/2020   TRIG 90 05/17/2020   HDL 60 05/17/2020   LDLCALC 94 05/17/2020   ALT 10 05/17/2020   AST 15 05/17/2020   NA 142 05/17/2020   K 4.1 05/17/2020   CL 107 05/17/2020   CREATININE 0.9 05/17/2020   BUN 19 05/17/2020   CO2 29 (A) 05/17/2020   TSH 2.45 06/29/2020   HGBA1C 5.7 (A) 01/31/2021   BP Readings from Last 3 Encounters:  01/31/21 116/78  10/19/20 126/80  08/02/20 112/80    ASSESSMENT AND PLAN:  Discussed the following assessment and plan:  Hyperglycemia - Plan: POCT glycosylated hemoglobin (Hb A1C)  Medication management - Plan: POCT glycosylated hemoglobin (Hb A1C)  Class 1 obesity with body mass index (BMI) of 33.0 to 33.9 in adult, unspecified obesity type, unspecified whether serious comorbidity present Globin A1c is down to 5.7 and her weight has decreased 17 pounds blood pressure in control. Discussed her dietary intervention and to continue she is motivated at this time. Plan follow-up CPX and lab when due in October 2022 or as needed in the meantime. -Patient advised to return or notify health care team  if  new concerns arise.  Patient Instructions  Hg a1c 5.7  glad you are being successful so far .   Continue lifestyle intervention healthy eating and exercise .   Plana fu   cpx and lab October  2022       Standley Brooking. Lataunya Ruud M.D.

## 2021-01-31 NOTE — Patient Instructions (Addendum)
Hg a1c 5.7  glad you are being successful so far .   Continue lifestyle intervention healthy eating and exercise .   Plana fu   cpx and lab October  2022

## 2021-02-18 DIAGNOSIS — R748 Abnormal levels of other serum enzymes: Secondary | ICD-10-CM

## 2021-02-21 ENCOUNTER — Other Ambulatory Visit: Payer: Self-pay | Admitting: Internal Medicine

## 2021-02-22 NOTE — Telephone Encounter (Signed)
Hi Brandy Maynard from what I can tell you have had some mild minor elevations of the alkaline phosphatase. Many things can cause this most of the time it is not serious or not serious some are Sometimes medicines can do it low vitamin D levels among others.  It appears you have had a number of blood tests done at various places for a number of reasons.  My suggestion is to order some future lab work include alkaline phosphatase an updated vitamin D level couple other lab test as a screening Alkaline phosphatase can come from the liver or the bone.  Best wishes to your mom.

## 2021-02-24 ENCOUNTER — Other Ambulatory Visit: Payer: Self-pay

## 2021-02-25 ENCOUNTER — Other Ambulatory Visit (INDEPENDENT_AMBULATORY_CARE_PROVIDER_SITE_OTHER): Payer: BC Managed Care – PPO

## 2021-02-25 DIAGNOSIS — R748 Abnormal levels of other serum enzymes: Secondary | ICD-10-CM | POA: Diagnosis not present

## 2021-02-25 LAB — CBC WITH DIFFERENTIAL/PLATELET
Basophils Absolute: 0 10*3/uL (ref 0.0–0.1)
Basophils Relative: 0.6 % (ref 0.0–3.0)
Eosinophils Absolute: 0.1 10*3/uL (ref 0.0–0.7)
Eosinophils Relative: 0.8 % (ref 0.0–5.0)
HCT: 43 % (ref 36.0–46.0)
Hemoglobin: 14.6 g/dL (ref 12.0–15.0)
Lymphocytes Relative: 21.1 % (ref 12.0–46.0)
Lymphs Abs: 1.6 10*3/uL (ref 0.7–4.0)
MCHC: 34 g/dL (ref 30.0–36.0)
MCV: 87.4 fl (ref 78.0–100.0)
Monocytes Absolute: 0.4 10*3/uL (ref 0.1–1.0)
Monocytes Relative: 5.7 % (ref 3.0–12.0)
Neutro Abs: 5.5 10*3/uL (ref 1.4–7.7)
Neutrophils Relative %: 71.8 % (ref 43.0–77.0)
Platelets: 245 10*3/uL (ref 150.0–400.0)
RBC: 4.92 Mil/uL (ref 3.87–5.11)
RDW: 13.1 % (ref 11.5–15.5)
WBC: 7.7 10*3/uL (ref 4.0–10.5)

## 2021-02-25 LAB — HEPATIC FUNCTION PANEL
ALT: 13 U/L (ref 0–35)
AST: 16 U/L (ref 0–37)
Albumin: 4.3 g/dL (ref 3.5–5.2)
Alkaline Phosphatase: 132 U/L — ABNORMAL HIGH (ref 39–117)
Bilirubin, Direct: 0.1 mg/dL (ref 0.0–0.3)
Total Bilirubin: 0.6 mg/dL (ref 0.2–1.2)
Total Protein: 6.9 g/dL (ref 6.0–8.3)

## 2021-02-25 LAB — BASIC METABOLIC PANEL
BUN: 19 mg/dL (ref 6–23)
CO2: 26 mEq/L (ref 19–32)
Calcium: 9.9 mg/dL (ref 8.4–10.5)
Chloride: 104 mEq/L (ref 96–112)
Creatinine, Ser: 0.86 mg/dL (ref 0.40–1.20)
GFR: 77.84 mL/min (ref >60.00)
Glucose, Bld: 106 mg/dL — ABNORMAL HIGH (ref 70–99)
Potassium: 4 mEq/L (ref 3.5–5.1)
Sodium: 139 mEq/L (ref 135–145)

## 2021-02-25 LAB — VITAMIN D 25 HYDROXY (VIT D DEFICIENCY, FRACTURES): VITD: 77.54 ng/mL (ref 30.00–100.00)

## 2021-02-25 LAB — GAMMA GT: GGT: 12 U/L (ref 7–51)

## 2021-02-28 LAB — NUCLEOTIDASE, 5', BLOOD: 5-Nucleotidase: 4 U/L (ref 0–10)

## 2021-03-01 NOTE — Progress Notes (Signed)
So the borderline elevated alkaline phosphatase is not coming from the liver.  There are many other causes and fortunately its not very high.  Because you have no anemia and other signs of blood disease I do not think it is related to your mom's diagnosis.  However I agree we should follow. Sometimes medication s  /supplements could be a factor  Can recheck at next  visit

## 2021-03-02 ENCOUNTER — Telehealth: Payer: Self-pay | Admitting: Internal Medicine

## 2021-03-02 NOTE — Telephone Encounter (Signed)
Patient is returning the call for lab results, please advise. CB is [phone]

## 2021-03-04 ENCOUNTER — Telehealth: Payer: Self-pay | Admitting: Internal Medicine

## 2021-03-04 NOTE — Telephone Encounter (Signed)
error 

## 2021-05-13 ENCOUNTER — Other Ambulatory Visit: Payer: Self-pay

## 2021-05-13 ENCOUNTER — Ambulatory Visit: Payer: BC Managed Care – PPO | Admitting: Obstetrics & Gynecology

## 2021-05-13 VITALS — BP 138/78 | HR 80 | Resp 20

## 2021-05-13 DIAGNOSIS — N6314 Unspecified lump in the right breast, lower inner quadrant: Secondary | ICD-10-CM

## 2021-05-13 NOTE — Progress Notes (Signed)
    Brandy Maynard 02/01/1971 [id number]        50 y.o.  [id number]   RP: Noticed a lump in her right breast yesterday  HPI: Noticed a lump in her right breast yesterday.  The lump is small, mildly tender.  No change in skin.  No redness.  Last screening mammogram November 2021 was negative.  No family history of breast cancer.  Patient is perimenopausal, well on Prometrium 100 mg per mouth at bedtime daily.  Last menstrual period December 2021.  No irregular bleeding currently.  No pelvic pain.   OB History  Gravida Para Term Preterm AB Living  3 2 2   1 2   SAB IAB Ectopic Multiple Live Births  1       2    # Outcome Date GA Lbr Len/2nd Weight Sex Delivery Anes PTL Lv  3 SAB           2 Term     F Vag-Spont  N LIV  1 Term     F Vag-Spont  N LIV    Past medical history,surgical history, problem list, medications, allergies, family history and social history were all reviewed and documented in the EPIC chart.   Directed ROS with pertinent positives and negatives documented in the history of present illness/assessment and plan.  Exam:  Vitals:   05/13/21 1544  BP: 138/78  Pulse: 80  Resp: 20   General appearance:  Normal  Breast exam:  Left breast and axilla normal                        Right breast:  Cord-like mobile lump at 5:00.  Mildly tender.  No other finding.  Right axilla normal.   Assessment/Plan:  50 y.o. [id number]   1. Breast lump on right side at 5 o'clock position  Cordlike soft mobile lump at 5:00 on the right breast.  No other findings.  Right axilla negative.  No family history of breast cancer.  Last mammogram November 2021 was negative.  We will further investigate with a right diagnostic mammogram and ultrasound.  Patient voiced understanding and agreement with plan.   Princess Bruins MD, 4:16 PM 05/13/2021

## 2021-05-15 ENCOUNTER — Encounter: Payer: Self-pay | Admitting: Obstetrics & Gynecology

## 2021-05-16 ENCOUNTER — Telehealth: Payer: Self-pay | Admitting: *Deleted

## 2021-05-16 NOTE — Telephone Encounter (Signed)
Patient scheduled at [facility] on 06/15/21 @ 7:45am

## 2021-05-16 NOTE — Telephone Encounter (Signed)
-----   Message from Princess Bruins, MD sent at 05/13/2021  4:22 PM EDT ----- Regarding: Rt Dx mammo/US RT breast cord-like lump, mobile, tender at 5 O'Clock.

## 2021-05-23 NOTE — Telephone Encounter (Signed)
Patient aware, she received a e-mail from [facility] about appointment.

## 2021-05-24 ENCOUNTER — Encounter: Payer: Self-pay | Admitting: Internal Medicine

## 2021-06-15 DIAGNOSIS — R928 Other abnormal and inconclusive findings on diagnostic imaging of breast: Secondary | ICD-10-CM | POA: Diagnosis not present

## 2021-06-15 DIAGNOSIS — N6310 Unspecified lump in the right breast, unspecified quadrant: Secondary | ICD-10-CM | POA: Diagnosis not present

## 2021-06-21 ENCOUNTER — Encounter: Payer: Self-pay | Admitting: Obstetrics & Gynecology

## 2021-07-07 ENCOUNTER — Telehealth (INDEPENDENT_AMBULATORY_CARE_PROVIDER_SITE_OTHER): Payer: BC Managed Care – PPO | Admitting: Internal Medicine

## 2021-07-07 ENCOUNTER — Encounter: Payer: Self-pay | Admitting: Internal Medicine

## 2021-07-07 VITALS — BP 110/76 | HR 98 | Ht 66.0 in | Wt 199.0 lb

## 2021-07-07 DIAGNOSIS — F4320 Adjustment disorder, unspecified: Secondary | ICD-10-CM

## 2021-07-07 DIAGNOSIS — R198 Other specified symptoms and signs involving the digestive system and abdomen: Secondary | ICD-10-CM

## 2021-07-07 DIAGNOSIS — R0789 Other chest pain: Secondary | ICD-10-CM | POA: Diagnosis not present

## 2021-07-07 DIAGNOSIS — F4321 Adjustment disorder with depressed mood: Secondary | ICD-10-CM | POA: Diagnosis not present

## 2021-07-07 DIAGNOSIS — Z79899 Other long term (current) drug therapy: Secondary | ICD-10-CM

## 2021-07-07 MED ORDER — ALPRAZOLAM 0.5 MG PO TABS: 0.5000 mg | ORAL_TABLET | Freq: Three times a day (TID) | ORAL | 0 refills | Status: DC | PRN

## 2021-07-07 NOTE — Progress Notes (Signed)
Virtual Visit via Video Note  I connected with   Brandy Maynard 07/07/21 at 10:00 AM EDT by a video enabled telemedicine application and verified that I am speaking with the correct person using two identifiers. Location patient: home Location provider: home office Persons participating in the virtual visit: patient, provider  [place] national recommendations  regarding COVID 19 pandemic   video visit is advised over in office visit for this patient.  Patient aware  of the limitations of evaluation and management by telemedicine and  availability of in person appointments. and agreed to proceed.   HPI: Brandy Maynard presents for video visit because of 1 to 2 days of above symptoms.  She thinks it may be anxiety and acute grief but wanted to discuss situation. Her beloved pet dog of many years became ill with condition was prepped for surgery of a gastric tumor and had acute bleed and died at suddenly at the the vet hosp yesterday .   She is  helping mom who is on chemotherapy doing okay and other friends. She took an extra Prilosec yesterday did not seem to help her burning stomach.  No vomiting diarrhea although appetite is decreased Sharp under left breast and back pain is continual and not associated with shortness of breath exertion. She does have IBS that she controls mostly with her diet. Her weight is gone down to 199.  Blood pressure reported as normal.    ROS: See pertinent positives and negatives per HPI.  Past Medical History:  Diagnosis Date   Allergy    Anemia    ANEMIA 12/15/2009   Qualifier: Diagnosis of  By: Regis Bill MD, Standley Brooking    Depression    GERD (gastroesophageal reflux disease)    Heavy menses    on ocps   IBS (irritable bowel syndrome)    by hx and had a neg colonscopy   Migraines     Past Surgical History:  Procedure Laterality Date   CHOLECYSTECTOMY     COLONOSCOPY  08/03/2005   COLONOSCOPY W/ BIOPSIES  Nov 12 2015   ESOPHAGOGASTRODUODENOSCOPY  ENDOSCOPY  Nov 12 2015   FOOT SURGERY     KNEE ARTHROSCOPY  2012   right knee,,, left knee at [place]  2002   Rt ankle   PELVIC LAPAROSCOPY  10/11/2005   LAPAROTOMY, LYSIS OF PELVIC ADHESIONS, RSO AND CAUTERIZATION AND TRANSECTION OF LEFT FALLOPIAN TUBE   PLICA, arthritis and cartilage repair  01/11/2011   on Rt knee   TUBAL LIGATION  09/2005   scar tissue removal ovarian cyst    Family History  Problem Relation Age of Onset   Arthritis Mother    Hypertension Mother    Cancer Mother        MELANOMA   Hyperlipidemia Mother    Hypertension Maternal Grandmother    Dementia Maternal Grandmother    Hypertension Maternal Grandfather    Cancer Maternal Grandfather        MELANOMA   Heart disease Paternal Grandmother    Diabetes Paternal Grandfather    Heart disease Paternal Grandfather    Heart disease Father    Cancer - Ovarian Other        Mat Great Grandmother    Social History   Tobacco Use   Smoking status: Never   Smokeless tobacco: Never  Vaping Use   Vaping Use: Never used  Substance Use Topics   Alcohol use: Yes    Alcohol/week: 0.0 standard drinks  Comment: 1-2 DRINKS A MONTH    Drug use: No      Current Outpatient Medications:    ALPRAZolam (XANAX) 0.5 MG tablet, Take 1 tablet (0.5 mg total) by mouth 3 (three) times daily as needed for anxiety., Disp: 24 tablet, Rfl: 0   cetirizine (ZYRTEC) 10 MG tablet, Take 10 mg by mouth daily., Disp: , Rfl:    Cholecalciferol (VITAMIN D3 PO), Take by mouth., Disp: , Rfl:    fluticasone (FLONASE) 50 MCG/ACT nasal spray, Place into both nostrils as needed for allergies or rhinitis., Disp: , Rfl:    Melatonin 5 MG TABS, Take 5 mg by mouth daily., Disp: , Rfl:    metFORMIN (GLUCOPHAGE-XR) 500 MG 24 hr tablet, TAKE 1 TABLET(500 MG) BY MOUTH TWICE DAILY WITH A MEAL, Disp: 180 tablet, Rfl: 1   omeprazole (PRILOSEC OTC) 20 MG tablet, Take 20 mg by mouth., Disp: , Rfl:    Probiotic Product (PROBIOTIC DAILY PO),  Take by mouth., Disp: , Rfl:    progesterone (PROMETRIUM) 100 MG capsule, Take 1 capsule (100 mg total) by mouth at bedtime., Disp: 90 capsule, Rfl: 4  EXAM: BP Readings from Last 3 Encounters:  07/07/21 110/76  05/13/21 138/78  01/31/21 116/78    VITALS per patient if applicable: emotional at times  talking of loss  non toxic   GENERAL: alert, oriented, appears well and in no acute distress  HEENT: atraumatic, conjunttiva clear, no obvious abnormalities on inspection of external nose and ears  NECK: normal movements of the head and neck  LUNGS: on inspection no signs of respiratory distress, breathing rate appears normal, no obvious gross SOB, gasping or wheezing  CV: no obvious cyanosis  MS: moves all visible extremities without noticeable abnormality  PSYCH/NEURO: nl interaction  emotional with  reviewing loss appropriately  speech and thought processing grossly intact Lab Results  Component Value Date   WBC 7.7 02/25/2021   HGB 14.6 02/25/2021   HCT 43.0 02/25/2021   PLT 245.0 02/25/2021   GLUCOSE 106 (H) 02/25/2021   CHOL 172 05/17/2020   TRIG 90 05/17/2020   HDL 60 05/17/2020   LDLCALC 94 05/17/2020   ALT 13 02/25/2021   AST 16 02/25/2021   NA 139 02/25/2021   K 4.0 02/25/2021   CL 104 02/25/2021   CREATININE 0.86 02/25/2021   BUN 19 02/25/2021   CO2 26 02/25/2021   TSH 2.45 06/29/2020   HGBA1C 5.7 (A) 01/31/2021    ASSESSMENT AND PLAN:  Discussed the following assessment and plan:    ICD-10-CM   1. Other chest pain back pain  R07.89    doesn not seem cardiac or alarming pulm could be acute grief no assoc sx     2. Abdominal burning sensation   R19.8    hx of obs  on ppi ok to add  pepcid maalox  but focus on physical sx of acute grief     3. Grief reaction  F43.21     4. Medication management  Z79.899      Discussed options I agree this is most likely acute grief reaction no alarming symptoms are associated. She has had a GI cocktail in the past  but that is only Mostly in the ED Consideration of Antispasmodic but Would Probably Do Better with a Benzo As Needed Short-Term and Have Her Add Pepcid or Maalox to Her Prilosec and Dietary Changes. And Sure to Stay Hydrated Prescription for Alprazolam No24    0. '5mg'$   3  Times Daily As Needed short term  Let me know how doing next week and can do fu visit if needed or  persistent or progressive  Counseled.  Risk benefit of medication discussed.  Patient aware.   Expectant management and discussion of plan and treatment with opportunity to ask questions and all were answered. The patient agreed with the plan and demonstrated an understanding of the instructions.   Advised to call back or seek an in-person evaluation if worsening  or having  further concerns . Return if symptoms worsen or fail to improve, for when planned. I Shanon Ace, MD

## 2021-08-09 ENCOUNTER — Encounter: Payer: BC Managed Care – PPO | Admitting: Internal Medicine

## 2021-08-29 NOTE — Progress Notes (Signed)
Chief Complaint  Patient presents with   Annual Exam     HPI: Patient  Brandy Maynard  50 y.o. comes in today for Preventive Health Care visit   Working on weight loss was down to 197 and then her third dog passed away and other factors that she began to gain weight again is lost gained about 10 pounds back. Taking metformin XR 500 mg once a day. Feels that she can get back on track no new medical symptoms see past notes.  Health Maintenance  Topic Date Due   Pneumococcal Vaccine 59-70 Years old (1 - PCV) Never done   HIV Screening  Never done   Hepatitis C Screening  Never done   Zoster Vaccines- Shingrix (1 of 2) Never done   COLONOSCOPY (Pts 45-56yrs Insurance coverage will need to be confirmed)  08/04/2015   COVID-19 Vaccine (3 - Pfizer risk series) 08/22/2020   PAP SMEAR-Modifier  11/21/2021 (Originally 01/02/2020)   MAMMOGRAM  09/07/2021   TETANUS/TDAP  04/29/2022   INFLUENZA VACCINE  Completed   HPV VACCINES  Aged Out   Health Maintenance Review LIFESTYLE:  Exercise:  not as much .  Tobacco/ETS:n Alcohol: n Sugar beverages:n Sleep:ok Drug use: no    ROS:  GREST of 12 system review negative except as per HPI   Past Medical History:  Diagnosis Date   Allergy    Anemia    ANEMIA 12/15/2009   Qualifier: Diagnosis of  By: Regis Bill MD, Standley Brooking    Depression    GERD (gastroesophageal reflux disease)    Heavy menses    on ocps   IBS (irritable bowel syndrome)    by hx and had a neg colonscopy   Migraines     Past Surgical History:  Procedure Laterality Date   CHOLECYSTECTOMY     COLONOSCOPY  08/03/2005   COLONOSCOPY W/ BIOPSIES  Nov 12 2015   ESOPHAGOGASTRODUODENOSCOPY ENDOSCOPY  Nov 12 2015   FOOT SURGERY     KNEE ARTHROSCOPY  2012   right knee,,, left knee at 79   LIGAMENT REPAIR  2002   Rt ankle   PELVIC LAPAROSCOPY  10/11/2005   LAPAROTOMY, LYSIS OF PELVIC ADHESIONS, RSO AND CAUTERIZATION AND TRANSECTION OF LEFT FALLOPIAN TUBE   PLICA, arthritis  and cartilage repair  01/11/2011   on Rt knee   TUBAL LIGATION  09/2005   scar tissue removal ovarian cyst    Family History  Problem Relation Age of Onset   Arthritis Mother    Hypertension Mother    Cancer Mother        MELANOMA   Hyperlipidemia Mother    Hypertension Maternal Grandmother    Dementia Maternal Grandmother    Hypertension Maternal Grandfather    Cancer Maternal Grandfather        MELANOMA   Heart disease Paternal Grandmother    Diabetes Paternal Grandfather    Heart disease Paternal Grandfather    Heart disease Father    Cancer - Ovarian Other        Mat Event organiser    Social History   Socioeconomic History   Marital status: Married    Spouse name: Not on file   Number of children: Not on file   Years of education: Not on file   Highest education level: Not on file  Occupational History   Not on file  Tobacco Use   Smoking status: Never   Smokeless tobacco: Never  Vaping Use   Vaping Use:  Never used  Substance and Sexual Activity   Alcohol use: Yes    Alcohol/week: 0.0 standard drinks    Comment: 1-2 DRINKS A MONTH    Drug use: No   Sexual activity: Yes    Partners: Male    Comment: 1st intercourse- 16, parnters- 3, married- 8 yrs   Other Topics Concern   Not on file  Social History Narrative   Occupation: unemployed   Married   [place] of 3-4    Pet dogs 2   G3P2         Social Determinants of Radio broadcast assistant Strain: Not on file  Food Insecurity: Not on file  Transportation Needs: Not on file  Physical Activity: Not on file  Stress: Not on file  Social Connections: Not on file    Outpatient Medications Prior to Visit  Medication Sig Dispense Refill   ALPRAZolam (XANAX) 0.5 MG tablet Take 1 tablet (0.5 mg total) by mouth 3 (three) times daily as needed for anxiety. 24 tablet 0   cetirizine (ZYRTEC) 10 MG tablet Take 10 mg by mouth daily.     Cholecalciferol (VITAMIN D3 PO) Take by mouth.     fluticasone  (FLONASE) 50 MCG/ACT nasal spray Place into both nostrils as needed for allergies or rhinitis.     Melatonin 5 MG TABS Take 5 mg by mouth daily.     metFORMIN (GLUCOPHAGE-XR) 500 MG 24 hr tablet TAKE 1 TABLET(500 MG) BY MOUTH TWICE DAILY WITH A MEAL 180 tablet 1   omeprazole (PRILOSEC OTC) 20 MG tablet Take 20 mg by mouth.     Probiotic Product (PROBIOTIC DAILY PO) Take by mouth.     progesterone (PROMETRIUM) 100 MG capsule Take 1 capsule (100 mg total) by mouth at bedtime. 90 capsule 4   No facility-administered medications prior to visit.     EXAM:  BP 108/70 (BP Location: Left Arm, Patient Position: Sitting, Cuff Size: Normal)   Pulse 83   Temp 98.1 F (36.7 C) (Oral)   Ht 5\' 6"  (1.676 m)   Wt 207 lb (93.9 kg)   SpO2 97%   BMI 33.41 kg/m   Body mass index is 33.41 kg/m. Wt Readings from Last 3 Encounters:  08/30/21 207 lb (93.9 kg)  07/07/21 199 lb (90.3 kg)  01/31/21 207 lb 12.8 oz (94.3 kg)    Physical Exam: Vital signs reviewed CBJ:SEGB is a well-developed well-nourished alert cooperative    who appearsr stated age in no acute distress.  HEENT: normocephalic atraumatic , Eyes: PERRL EOM's full, conjunctiva clear, Nares: paten,t no deformity discharge or tenderness., Ears: no deformity EAC's clear TMs with normal landmarks. Mouth: Masked NECK: supple without masses, thyromegaly or bruits. CHEST/PULM:  Clear to auscultation and percussion breath sounds equal no wheeze , rales or rhonchi. No chest wall deformities or tenderness. Breast: Per GYN CV: PMI is nondisplaced, S1 S2 no gallops, murmurs, rubs. Peripheral pulses are full without delay.No JVD .  ABDOMEN: Bowel sounds normal nontender  No guard or rebound, no hepato splenomegal no CVA tenderness.   Extremtities:  No clubbing cyanosis or edema, no acute joint swelling or redness no focal atrophy NEURO:  Oriented x3, cranial nerves 3-12 appear to be intact, no obvious focal weakness,gait within normal limits no abnormal  reflexes or asymmetrical SKIN: No acute rashes normal turgor, color, no bruising or petechiae. PSYCH: Oriented, good eye contact, no obvious depression anxiety, cognition and judgment appear normal. LN: no cervical axillary inguinal adenopathy  Lab Results  Component Value Date   WBC 7.7 02/25/2021   HGB 14.6 02/25/2021   HCT 43.0 02/25/2021   PLT 245.0 02/25/2021   GLUCOSE 106 (H) 02/25/2021   CHOL 172 05/17/2020   TRIG 90 05/17/2020   HDL 60 05/17/2020   LDLCALC 94 05/17/2020   ALT 13 02/25/2021   AST 16 02/25/2021   NA 139 02/25/2021   K 4.0 02/25/2021   CL 104 02/25/2021   CREATININE 0.86 02/25/2021   BUN 19 02/25/2021   CO2 26 02/25/2021   TSH 2.45 06/29/2020   HGBA1C 5.7 (A) 01/31/2021    BP Readings from Last 3 Encounters:  08/30/21 108/70  07/07/21 110/76  05/13/21 138/78    Lab rplan reviewed with patient  Lipids a1c etc. fasting ASSESSMENT AND PLAN:  Discussed the following assessment and plan:    ICD-10-CM   1. Visit for preventive health examination  Z00.00 Lipid panel    Hemoglobin N0N    Basic metabolic panel    Hepatic function panel    TSH    TSH    Hepatic function panel    Basic metabolic panel    Hemoglobin A1c    Lipid panel    2. Need for immunization against influenza  Z23 Flu Vaccine QUAD 16mo+IM (Fluarix, Fluzone & Alfiuria Quad PF)    3. Medication management  Z79.899 Lipid panel    Hemoglobin L9J    Basic metabolic panel    Hepatic function panel    TSH    TSH    Hepatic function panel    Basic metabolic panel    Hemoglobin A1c    Lipid panel    4. Hyperglycemia  R73.9 Lipid panel    Hemoglobin Q7H    Basic metabolic panel    Hepatic function panel    TSH    TSH    Hepatic function panel    Basic metabolic panel    Hemoglobin A1c    Lipid panel    5. BMI 33.0-33.9,adult  Z68.33 TSH    TSH    Reviewed plan to tread water and get no further weight gain strategies discussed Plan follow-up depending on lab work or  about 6 months to follow blood sugar etc. or as needed.  Return in about 6 months (around 02/27/2022) for depending on results.  Patient Care Team: Aluna Whiston, Standley Brooking, MD as PCP - Lorenza Cambridge, MD (Orthopedic Surgery) Jerrell Belfast, MD (Otolaryngology) Princess Bruins, MD as Consulting Physician (Obstetrics and Gynecology) Patient Instructions  Good to see  you today.  Exam is normal.   Will notify you  of labs when available.   Continue lifestyle intervention healthy eating and exercise . Attention.   Fu depending on results   6 months  Standley Brooking. Nate Common M.D.

## 2021-08-30 ENCOUNTER — Encounter: Payer: Self-pay | Admitting: Internal Medicine

## 2021-08-30 ENCOUNTER — Other Ambulatory Visit: Payer: Self-pay

## 2021-08-30 ENCOUNTER — Ambulatory Visit (INDEPENDENT_AMBULATORY_CARE_PROVIDER_SITE_OTHER): Payer: BC Managed Care – PPO | Admitting: Internal Medicine

## 2021-08-30 VITALS — BP 108/70 | HR 83 | Temp 98.1°F | Ht 66.0 in | Wt 207.0 lb

## 2021-08-30 DIAGNOSIS — Z0000 Encounter for general adult medical examination without abnormal findings: Secondary | ICD-10-CM | POA: Diagnosis not present

## 2021-08-30 DIAGNOSIS — E559 Vitamin D deficiency, unspecified: Secondary | ICD-10-CM

## 2021-08-30 DIAGNOSIS — R739 Hyperglycemia, unspecified: Secondary | ICD-10-CM | POA: Diagnosis not present

## 2021-08-30 DIAGNOSIS — Z6833 Body mass index (BMI) 33.0-33.9, adult: Secondary | ICD-10-CM | POA: Diagnosis not present

## 2021-08-30 DIAGNOSIS — Z23 Encounter for immunization: Secondary | ICD-10-CM

## 2021-08-30 DIAGNOSIS — R748 Abnormal levels of other serum enzymes: Secondary | ICD-10-CM

## 2021-08-30 DIAGNOSIS — Z79899 Other long term (current) drug therapy: Secondary | ICD-10-CM | POA: Diagnosis not present

## 2021-08-30 LAB — BASIC METABOLIC PANEL
BUN: 19 mg/dL (ref 6–23)
CO2: 28 mEq/L (ref 19–32)
Calcium: 9.8 mg/dL (ref 8.4–10.5)
Chloride: 105 mEq/L (ref 96–112)
Creatinine, Ser: 0.84 mg/dL (ref 0.40–1.20)
GFR: 79.78 mL/min (ref >60.00)
Glucose, Bld: 101 mg/dL — ABNORMAL HIGH (ref 70–99)
Potassium: 4.3 mEq/L (ref 3.5–5.1)
Sodium: 140 mEq/L (ref 135–145)

## 2021-08-30 LAB — HEPATIC FUNCTION PANEL
ALT: 14 U/L (ref 0–35)
AST: 17 U/L (ref 0–37)
Albumin: 4.3 g/dL (ref 3.5–5.2)
Alkaline Phosphatase: 125 U/L — ABNORMAL HIGH (ref 39–117)
Bilirubin, Direct: 0 mg/dL (ref 0.0–0.3)
Total Bilirubin: 0.5 mg/dL (ref 0.2–1.2)
Total Protein: 7 g/dL (ref 6.0–8.3)

## 2021-08-30 LAB — LIPID PANEL
Cholesterol: 178 mg/dL (ref 0–200)
HDL: 55.1 mg/dL (ref >39.00)
LDL Cholesterol: 104 mg/dL — ABNORMAL HIGH (ref 0–99)
NonHDL: 123.36
Total CHOL/HDL Ratio: 3
Triglycerides: 98 mg/dL (ref 0.0–149.0)
VLDL: 19.6 mg/dL (ref 0.0–40.0)

## 2021-08-30 LAB — TSH: TSH: 2.94 u[IU]/mL (ref 0.35–5.50)

## 2021-08-30 LAB — HEMOGLOBIN A1C: Hgb A1c MFr Bld: 6.1 % (ref 4.6–6.5)

## 2021-08-30 NOTE — Patient Instructions (Addendum)
Good to see  you today.  Exam is normal.   Will notify you  of labs when available.   Continue lifestyle intervention healthy eating and exercise . Attention.   Fu depending on results   6 months

## 2021-09-04 NOTE — Progress Notes (Signed)
Results stable  blood sugar borderline elevated   alk phos still slightly elevated . Advise  check lfts   vit d and hg a1c in 6 mos  Continueattention to  lifestyle intervention healthy eating and activity

## 2021-09-05 NOTE — Addendum Note (Signed)
Addended by: Nilda Riggs on: 09/05/2021 09:58 AM   Modules accepted: Orders

## 2021-09-08 ENCOUNTER — Telehealth (INDEPENDENT_AMBULATORY_CARE_PROVIDER_SITE_OTHER): Payer: BC Managed Care – PPO | Admitting: Internal Medicine

## 2021-09-08 ENCOUNTER — Encounter: Payer: Self-pay | Admitting: Internal Medicine

## 2021-09-08 DIAGNOSIS — R509 Fever, unspecified: Secondary | ICD-10-CM | POA: Diagnosis not present

## 2021-09-08 DIAGNOSIS — J989 Respiratory disorder, unspecified: Secondary | ICD-10-CM | POA: Diagnosis not present

## 2021-09-08 MED ORDER — HYDROCODONE BIT-HOMATROP MBR 5-1.5 MG/5ML PO SOLN: 5.0000 mL | Freq: Four times a day (QID) | ORAL | 0 refills | Status: DC | PRN

## 2021-09-08 NOTE — Progress Notes (Signed)
Virtual Visit via Video Note  I connected withLori G Hlavacek on 09/08/21 at  9:30 AM EST by a video enabled telemedicine application and verified that I am speaking with the correct person using two identifiers. Location patient: home Location provider:work or home office Persons participating in the virtual visit: patient, provider  [place] national recommendations  regarding COVID 19 pandemic   video visit is advised over in office visit for this patient.  Patient aware  of the limitations of evaluation and management by telemedicine and  availability of in person appointments. and agreed to proceed.   HPI: Brandy Maynard presents for video visit onset about 3 days ago of achiness cough upper respiratory congestion sinus pressure ears popping.  Low-grade fever 100 range and down using over-the-counter DayQuil NyQuil but is having a hard time sleeping because of the cough.  Tried some leftover hydrocodone cough medicine with some help asked for refill at this to help. No problems with respiratory distress chest pain.  Had a flu shot about 10 days ago, had COVID infection within the last 6 months and does not feel similar.    ROS: See pertinent positives and negatives per HPI.had face pressure congstion    Past Medical History:  Diagnosis Date   Allergy    Anemia    ANEMIA 12/15/2009   Qualifier: Diagnosis of  By: Regis Bill MD, Standley Brooking    Depression    GERD (gastroesophageal reflux disease)    Heavy menses    on ocps   IBS (irritable bowel syndrome)    by hx and had a neg colonscopy   Migraines     Past Surgical History:  Procedure Laterality Date   CHOLECYSTECTOMY     COLONOSCOPY  08/03/2005   COLONOSCOPY W/ BIOPSIES  Nov 12 2015   ESOPHAGOGASTRODUODENOSCOPY ENDOSCOPY  Nov 12 2015   FOOT SURGERY     KNEE ARTHROSCOPY  2012   right knee,,, left knee at [place]  2002   Rt ankle   PELVIC LAPAROSCOPY  10/11/2005   LAPAROTOMY, LYSIS OF PELVIC ADHESIONS, RSO AND  CAUTERIZATION AND TRANSECTION OF LEFT FALLOPIAN TUBE   PLICA, arthritis and cartilage repair  01/11/2011   on Rt knee   TUBAL LIGATION  09/2005   scar tissue removal ovarian cyst    Family History  Problem Relation Age of Onset   Arthritis Mother    Hypertension Mother    Cancer Mother        MELANOMA   Hyperlipidemia Mother    Hypertension Maternal Grandmother    Dementia Maternal Grandmother    Hypertension Maternal Grandfather    Cancer Maternal Grandfather        MELANOMA   Heart disease Paternal Grandmother    Diabetes Paternal Grandfather    Heart disease Paternal Grandfather    Heart disease Father    Cancer - Ovarian Other        Mat Great Grandmother    Social History   Tobacco Use   Smoking status: Never   Smokeless tobacco: Never  Vaping Use   Vaping Use: Never used  Substance Use Topics   Alcohol use: Yes    Alcohol/week: 0.0 standard drinks    Comment: 1-2 DRINKS A MONTH    Drug use: No      Current Outpatient Medications:    ALPRAZolam (XANAX) 0.5 MG tablet, Take 1 tablet (0.5 mg total) by mouth 3 (three) times daily as needed for anxiety., Disp: 24 tablet,  Rfl: 0   cetirizine (ZYRTEC) 10 MG tablet, Take 10 mg by mouth daily., Disp: , Rfl:    Cholecalciferol (VITAMIN D3 PO), Take by mouth., Disp: , Rfl:    fluticasone (FLONASE) 50 MCG/ACT nasal spray, Place into both nostrils as needed for allergies or rhinitis., Disp: , Rfl:    HYDROcodone bit-homatropine (HYCODAN) 5-1.5 MG/5ML syrup, Take 5 mLs by mouth every 6 (six) hours as needed for cough., Disp: 120 mL, Rfl: 0   Melatonin 5 MG TABS, Take 5 mg by mouth daily., Disp: , Rfl:    metFORMIN (GLUCOPHAGE-XR) 500 MG 24 hr tablet, TAKE 1 TABLET(500 MG) BY MOUTH TWICE DAILY WITH A MEAL, Disp: 180 tablet, Rfl: 1   omeprazole (PRILOSEC OTC) 20 MG tablet, Take 20 mg by mouth., Disp: , Rfl:    Probiotic Product (PROBIOTIC DAILY PO), Take by mouth., Disp: , Rfl:    progesterone (PROMETRIUM) 100 MG capsule,  Take 1 capsule (100 mg total) by mouth at bedtime., Disp: 90 capsule, Rfl: 4  EXAM: BP Readings from Last 3 Encounters:  08/30/21 108/70  07/07/21 110/76  05/13/21 138/78    VITALS per patient if applicable:  GENERAL: alert, oriented, appears well and in no acute distress non toxic  upper congestion  face no edema.   HEENT: atraumatic, conjunttiva clear, no obvious abnormalities on inspection of external nose and ears  NECK: normal movements of the head and neck  LUNGS: on inspection no signs of respiratory distress, breathing rate appears normal, no obvious gross SOB, gasping or wheezing  CV: no obvious cyanosis  PSYCH/NEURO: pleasant and cooperative, no obvious depression or anxiety, speech and thought processing grossly intact   ASSESSMENT AND PLAN:  Discussed the following assessment and plan:    ICD-10-CM   1. Respiratory illness with fever  J98.9    R50.9    low grade fever uncomplicated at this time      Flulike or other viral respiratory illness seems uncomplicated at this time.  We will continue supportive treatment add hydrocodone cough medicine at night as has been helpful to her previously as over-the-counter's have not been successful in comfort care. Follow-up with alarm symptoms.  May return to activity when fever is gone for over 24 hours and feeling better.  She could consider doing a COVID test even though she had a past history of COVID in the last 6 months. Counseled.   Expectant management and discussion of plan and treatment with opportunity to ask questions and all were answered. The patient agreed with the plan and demonstrated an understanding of the instructions.  although on allergy list can take hydrocodone cough med  Advised to call back or seek an in-person evaluation if worsening  or having  further concerns  in interim. Return if symptoms worsen or fail to improve.    Shanon Ace, MD

## 2021-09-13 DIAGNOSIS — Z1231 Encounter for screening mammogram for malignant neoplasm of breast: Secondary | ICD-10-CM | POA: Diagnosis not present

## 2021-09-15 ENCOUNTER — Encounter: Payer: Self-pay | Admitting: Obstetrics & Gynecology

## 2021-10-25 LAB — HM DIABETES EYE EXAM

## 2021-11-07 ENCOUNTER — Other Ambulatory Visit: Payer: Self-pay | Admitting: Obstetrics & Gynecology

## 2021-11-07 NOTE — Telephone Encounter (Signed)
Annual exam scheduled on 11/17/21

## 2021-11-11 ENCOUNTER — Encounter: Payer: Self-pay | Admitting: Internal Medicine

## 2021-11-17 ENCOUNTER — Other Ambulatory Visit (HOSPITAL_COMMUNITY)
Admission: RE | Admit: 2021-11-17 | Discharge: 2021-11-17 | Disposition: A | Discharge status: Home / Self Care | Payer: BC Managed Care – PPO | Source: Ambulatory Visit | Attending: Obstetrics & Gynecology | Admitting: Obstetrics & Gynecology

## 2021-11-17 ENCOUNTER — Other Ambulatory Visit: Payer: Self-pay

## 2021-11-17 ENCOUNTER — Ambulatory Visit (INDEPENDENT_AMBULATORY_CARE_PROVIDER_SITE_OTHER): Payer: BC Managed Care – PPO | Admitting: Obstetrics & Gynecology

## 2021-11-17 ENCOUNTER — Encounter: Payer: Self-pay | Admitting: Obstetrics & Gynecology

## 2021-11-17 VITALS — BP 114/70 | HR 70 | Resp 16 | Ht 64.75 in | Wt 208.0 lb

## 2021-11-17 DIAGNOSIS — E6609 Other obesity due to excess calories: Secondary | ICD-10-CM | POA: Diagnosis not present

## 2021-11-17 DIAGNOSIS — Z6834 Body mass index (BMI) 34.0-34.9, adult: Secondary | ICD-10-CM | POA: Diagnosis not present

## 2021-11-17 DIAGNOSIS — Z01419 Encounter for gynecological examination (general) (routine) without abnormal findings: Secondary | ICD-10-CM

## 2021-11-17 DIAGNOSIS — N951 Menopausal and female climacteric states: Secondary | ICD-10-CM

## 2021-11-17 MED ORDER — ESTRADIOL 0.05 MG/24HR TD PTTW: 1.0000 | MEDICATED_PATCH | TRANSDERMAL | 4 refills | Status: DC

## 2021-11-17 MED ORDER — PROGESTERONE MICRONIZED 100 MG PO CAPS
100.0000 mg | ORAL_CAPSULE | Freq: Every day | ORAL | 4 refills | Status: DC
Start: 2021-11-17 — End: 2022-04-28

## 2021-11-17 NOTE — Progress Notes (Signed)
Brandy Maynard April 11, 1971 [id number]   History:    51 y.o. G3P2A1L2  Married   RP:  Established patient presenting for annual gyn exam    HPI:  Menopausal syndrome.  LMP 10/03/2020 which lasted 12 days with moderate flow.  Hot flushes and night sweats worsening.  No pelvic pain.  No pain with IC.  Pap 12/2018 Negative.  Pap reflex today.  Breasts normal.  Mammo Neg 08/2021.  BMI 34.88.  Health labs with Fam MD.  [name] 2018 Benign polyps.   Past medical history,surgical history, family history and social history were all reviewed and documented in the EPIC chart.  Gynecologic History Patient's last menstrual period was 05/25/2021.  Obstetric History OB History  Gravida Para Term Preterm AB Living  3 2 2   1 2   SAB IAB Ectopic Multiple Live Births  1       2    # Outcome Date GA Lbr Len/2nd Weight Sex Delivery Anes PTL Lv  3 SAB           2 Term     F Vag-Spont  N LIV  1 Term     F Vag-Spont  N LIV     ROS: A ROS was performed and pertinent positives and negatives are included in the history.  GENERAL: No fevers or chills. HEENT: No change in vision, no earache, sore throat or sinus congestion. NECK: No pain or stiffness. CARDIOVASCULAR: No chest pain or pressure. No palpitations. PULMONARY: No shortness of breath, cough or wheeze. GASTROINTESTINAL: No abdominal pain, nausea, vomiting or diarrhea, melena or bright red blood per rectum. GENITOURINARY: No urinary frequency, urgency, hesitancy or dysuria. MUSCULOSKELETAL: No joint or muscle pain, no back pain, no recent trauma. DERMATOLOGIC: No rash, no itching, no lesions. ENDOCRINE: No polyuria, polydipsia, no heat or cold intolerance. No recent change in weight. HEMATOLOGICAL: No anemia or easy bruising or bleeding. NEUROLOGIC: No headache, seizures, numbness, tingling or weakness. PSYCHIATRIC: No depression, no loss of interest in normal activity or change in sleep pattern.     Exam:   BP 114/70    Pulse 70    Resp 16    Ht 5'  4.75" (1.645 m)    Wt 208 lb (94.3 kg)    LMP 05/25/2021    BMI 34.88 kg/m   Body mass index is 34.88 kg/m.  General appearance : Well developed well nourished female. No acute distress HEENT: Eyes: no retinal hemorrhage or exudates,  Neck supple, trachea midline, no carotid bruits, no thyroidmegaly Lungs: Clear to auscultation, no rhonchi or wheezes, or rib retractions  Heart: Regular rate and rhythm, no murmurs or gallops Breast:Examined in sitting and supine position were symmetrical in appearance, no palpable masses or tenderness,  no skin retraction, no nipple inversion, no nipple discharge, no skin discoloration, no axillary or supraclavicular lymphadenopathy Abdomen: no palpable masses or tenderness, no rebound or guarding Extremities: no edema or skin discoloration or tenderness  Pelvic: Vulva: Normal             Vagina: No gross lesions or discharge  Cervix: No gross lesions or discharge.  Pap reflex done.  Uterus  AV, normal size, shape and consistency, non-tender and mobile  Adnexa  Without masses or tenderness  Anus: Normal   Assessment/Plan:  51 y.o. female for annual exam   1. Encounter for routine gynecological examination with Papanicolaou smear of cervix Menopausal syndrome.  LMP 10/03/2020 which lasted 12 days with moderate flow.  Hot  flushes and night sweats worsening.  No pelvic pain.  No pain with IC.  Pap 12/2018 Negative.  Pap reflex today.  Breasts normal.  Mammo Neg 08/2021.  BMI 34.88.  Health labs with Fam MD.  [name] 2018 Benign polyps. - Cytology - PAP( [facility])  2. Menopausal syndrome Menopausal syndrome.  LMP 10/03/2020 which lasted 12 days with moderate flow.  Hot flushes and night sweats worsening.  Would like to start on HRT.  No CI.  Risks/Benefits thoroughly reviewed.  Usage discussed.  Decision to start on Estradiol patch 0.05 twice a week and Progesterone 100 mg PO HS.  Prescriptions sent to pharmacy.  3. Class 1 obesity due to excess calories  without serious comorbidity with body mass index (BMI) of 34.0 to 34.9 in adult Lower calorie/carb diet.  Regular fitness activities.  Other orders - progesterone (PROMETRIUM) 100 MG capsule; Take 1 capsule (100 mg total) by mouth at bedtime. - estradiol (VIVELLE-DOT) 0.05 MG/24HR patch; Place 1 patch (0.05 mg total) onto the skin 2 (two) times a week.   Princess Bruins MD, 11:08 AM 11/17/2021

## 2021-11-18 LAB — CYTOLOGY - PAP: Diagnosis: NEGATIVE

## 2021-11-24 ENCOUNTER — Encounter: Payer: Self-pay | Admitting: Obstetrics & Gynecology

## 2022-01-30 DIAGNOSIS — D2262 Melanocytic nevi of left upper limb, including shoulder: Secondary | ICD-10-CM | POA: Diagnosis not present

## 2022-01-30 DIAGNOSIS — L578 Other skin changes due to chronic exposure to nonionizing radiation: Secondary | ICD-10-CM | POA: Diagnosis not present

## 2022-01-30 DIAGNOSIS — D225 Melanocytic nevi of trunk: Secondary | ICD-10-CM | POA: Diagnosis not present

## 2022-01-30 DIAGNOSIS — L814 Other melanin hyperpigmentation: Secondary | ICD-10-CM | POA: Diagnosis not present

## 2022-02-20 ENCOUNTER — Ambulatory Visit (INDEPENDENT_AMBULATORY_CARE_PROVIDER_SITE_OTHER): Payer: BC Managed Care – PPO

## 2022-02-20 ENCOUNTER — Encounter: Payer: Self-pay | Admitting: Podiatry

## 2022-02-20 ENCOUNTER — Ambulatory Visit: Payer: BC Managed Care – PPO | Admitting: Podiatry

## 2022-02-20 DIAGNOSIS — M779 Enthesopathy, unspecified: Secondary | ICD-10-CM | POA: Diagnosis not present

## 2022-02-20 DIAGNOSIS — M7752 Other enthesopathy of left foot: Secondary | ICD-10-CM | POA: Diagnosis not present

## 2022-02-20 DIAGNOSIS — D3613 Benign neoplasm of peripheral nerves and autonomic nervous system of lower limb, including hip: Secondary | ICD-10-CM

## 2022-02-20 DIAGNOSIS — D3610 Benign neoplasm of peripheral nerves and autonomic nervous system, unspecified: Secondary | ICD-10-CM

## 2022-02-20 MED ORDER — TRIAMCINOLONE ACETONIDE 10 MG/ML IJ SUSP
20.0000 mg | Freq: Once | INTRAMUSCULAR | Status: AC
  Administered 2022-02-20: 20 mg

## 2022-02-22 NOTE — Progress Notes (Signed)
Subjective:  ? ?Patient ID: Brandy Maynard, female   DOB: 51 y.o.   MRN: [id number]  ? ?HPI ?Patient presents stating she has had a lot of pain in the left forefoot and states its been for a few months.  Does not remember specific injury but its been bothering her for few months and she also gets some pain in her left foot but not to the same degree.  Patient does not smoke likes to be active ? ? ?Review of Systems  ?All other systems reviewed and are negative. ? ? ?   ?Objective:  ?Physical Exam ?Vitals and nursing note reviewed.  ?Constitutional:   ?   Appearance: She is well-developed.  ?Pulmonary:  ?   Effort: Pulmonary effort is normal.  ?Musculoskeletal:     ?   General: Normal range of motion.  ?Skin: ?   General: Skin is warm.  ?Neurological:  ?   Mental Status: She is alert.  ?  ?Neurovascular status was found to be intact muscle strength is adequate range of motion within normal limits patient is found to have exquisite discomfort in the second metatarsal phalangeal joint left with fluid buildup around the joint surface and does have moderate bunion deformity left with flatfoot deformity noted bilateral.  Patient has worn orthotics in the past and had previous osteotomy surgery right ? ?   ?Assessment:  ?Inflammatory capsulitis of the second MPJ left with fluid buildup around the joint moderate bunion deformity and mild discomfort right ? ?   ?Plan:  ?We will get a focus on the left foot first x-rays done discussed and I went ahead did a proximal block left aspirated the joint did get a small amount of blood in the joint which indicates there may have been some trauma and I carefully injected quarter cc dexamethasone Kenalog to try to reduce the inflammation and applied metatarsal padding to take pressure off along with rigid bottom shoes.  Reappoint to recheck and we will see how this progresses and what else may be necessary ? ?X-rays indicate that there is structural bunion deformity there is a screw in  place third metatarsal of the right foot and there is slight elongation of the second metatarsal of the left foot ?   ? ? ?

## 2022-02-26 ENCOUNTER — Other Ambulatory Visit: Payer: Self-pay | Admitting: Internal Medicine

## 2022-02-26 NOTE — Progress Notes (Signed)
? ?Chief Complaint  ?Patient presents with  ? Follow-up  ? ? ?HPI: ?Brandy Maynard 51 y.o. come in for  6 mos  FU Chronic disease management   working on struggling to lose weight  ?Off track for a few months.  Does not think her numbers will be that good.  Stress and external factors tends to eat incorrectly for comfort. ?Is taking metformin 500 mg a day.  She is also now on hormonal replacement that is helped some menopausal symptoms and she is sleeping much better. ?Rybelsus  made her feel  badly.  Does not want to try the injectables ?She is taking vitamin D 5000 international units with K2 gets off [place]. ?Is interested in knowing what her cortisol is ?ROS: See pertinent positives and negatives per HPI. ? ?Past Medical History:  ?Diagnosis Date  ? Allergy   ? Anemia   ? ANEMIA 12/15/2009  ? Qualifier: Diagnosis of  By: Regis Bill MD, Standley Brooking   ? Depression   ? GERD (gastroesophageal reflux disease)   ? Heavy menses   ? on ocps  ? IBS (irritable bowel syndrome)   ? by hx and had a neg colonscopy  ? Migraines   ? ? ?Family History  ?Problem Relation Age of Onset  ? Arthritis Mother   ? Hypertension Mother   ? Cancer Mother   ?     MELANOMA  ? Hyperlipidemia Mother   ? Non-Hodgkin's lymphoma Mother   ? Heart disease Father   ? Hypertension Maternal Grandmother   ? Dementia Maternal Grandmother   ? Hypertension Maternal Grandfather   ? Cancer Maternal Grandfather   ?     MELANOMA  ? Heart disease Paternal Grandmother   ? Diabetes Paternal Grandfather   ? Heart disease Paternal Grandfather   ? Cancer - Ovarian Other   ?     Mat Great Grandmother  ? ? ?Social History  ? ?Socioeconomic History  ? Marital status: Married  ?  Spouse name: Not on file  ? Number of children: Not on file  ? Years of education: Not on file  ? Highest education level: Not on file  ?Occupational History  ? Not on file  ?Tobacco Use  ? Smoking status: Never  ? Smokeless tobacco: Never  ?Vaping Use  ? Vaping Use: Never used  ?Substance and Sexual  Activity  ? Alcohol use: Not Currently  ?  Comment: 1-2 DRINKS A MONTH   ? Drug use: No  ? Sexual activity: Yes  ?  Partners: Male  ?  Birth control/protection: Surgical  ?  Comment: 1st intercourse- 45, parnters- 3, married- 27 yrs, tubal  ?Other Topics Concern  ? Not on file  ?Social History Narrative  ? Occupation: unemployed  ? Married  ? HH of 3-4   ? Pet dogs 2  ? G3P2  ?   ?   ? ?Social Determinants of Health  ? ?Financial Resource Strain: Not on file  ?Food Insecurity: Not on file  ?Transportation Needs: Not on file  ?Physical Activity: Not on file  ?Stress: Not on file  ?Social Connections: Not on file  ? ? ?Outpatient Medications Prior to Visit  ?Medication Sig Dispense Refill  ? cetirizine (ZYRTEC) 10 MG tablet Take 10 mg by mouth daily.    ? Cholecalciferol (VITAMIN D3 PO) Take by mouth.    ? estradiol (VIVELLE-DOT) 0.05 MG/24HR patch Place 1 patch (0.05 mg total) onto the skin 2 (two) times a week. 24  patch 4  ? fluticasone (FLONASE) 50 MCG/ACT nasal spray Place into both nostrils as needed for allergies or rhinitis.    ? metFORMIN (GLUCOPHAGE-XR) 500 MG 24 hr tablet TAKE 1 TABLET(500 MG) BY MOUTH TWICE DAILY WITH A MEAL 180 tablet 1  ? omeprazole (PRILOSEC OTC) 20 MG tablet Take 20 mg by mouth. Every other day    ? Probiotic Product (PROBIOTIC DAILY PO) Take by mouth.    ? progesterone (PROMETRIUM) 100 MG capsule Take 1 capsule (100 mg total) by mouth at bedtime. 90 capsule 4  ? ALPRAZolam (XANAX) 0.5 MG tablet Take 1 tablet (0.5 mg total) by mouth 3 (three) times daily as needed for anxiety. (Patient not taking: Reported on 02/27/2022) 24 tablet 0  ? [employer] SUNSCREEN SPF 30 EX apply    ? conjugated estrogens (PREMARIN) 25 MG injection See admin instructions.    ? Melatonin 5 MG TABS Take 5 mg by mouth daily.    ? ?No facility-administered medications prior to visit.  ? ? ? ?EXAM: ? ?BP (!) 118/0   Pulse 70   Temp 98.5 ?F (36.9 ?C) (Oral)   Ht 5' 4.75" (1.645 m)   Wt 208 lb (94.3 kg)   SpO2 97%   BMI  34.88 kg/m?  ? ?Body mass index is 34.88 kg/m?. ? ?GENERAL: vitals reviewed and listed above, alert, oriented, appears well hydrated and in no acute distress ?HEENT: atraumatic, conjunctiva  clear, no obvious abnormalities on inspection of external nose and ears  ?NECK: no obvious masses on inspection palpation  ?Abdomen soft without again a megaly guarding or rebound obvious skin nonicteric no unusual rashes. ?No unusual striae or acne body hair changes. ?CV: HRRR, no clubbing cyanosis or  peripheral edema nl cap refill  ?MS: moves all extremities without noticeable focal  abnormality ?PSYCH: pleasant and cooperative, no obvious depression or anxiety ?Lab Results  ?Component Value Date  ? WBC 7.7 02/25/2021  ? HGB 14.6 02/25/2021  ? HCT 43.0 02/25/2021  ? PLT 245.0 02/25/2021  ? GLUCOSE 101 (H) 08/30/2021  ? CHOL 178 08/30/2021  ? TRIG 98.0 08/30/2021  ? HDL 55.10 08/30/2021  ? LDLCALC 104 (H) 08/30/2021  ? ALT 14 08/30/2021  ? AST 17 08/30/2021  ? NA 140 08/30/2021  ? K 4.3 08/30/2021  ? CL 105 08/30/2021  ? CREATININE 0.84 08/30/2021  ? BUN 19 08/30/2021  ? CO2 28 08/30/2021  ? TSH 2.94 08/30/2021  ? HGBA1C 6.1 08/30/2021  ? ?BP Readings from Last 3 Encounters:  ?02/27/22 (!) 118/0  ?11/17/21 114/70  ?08/30/21 108/70  ? ?Fasting today ?ASSESSMENT AND PLAN: ? ?Discussed the following assessment and plan: ? ?Medication management - Plan: Hemoglobin A1c, VITAMIN D 25 Hydroxy (Vit-D Deficiency, Fractures), Hepatic function panel, CBC with Differential/Platelet, Basic metabolic panel, Cortisol-am, blood, Cortisol-am, blood, Basic metabolic panel, CBC with Differential/Platelet, Hepatic function panel, VITAMIN D 25 Hydroxy (Vit-D Deficiency, Fractures), Hemoglobin A1c ? ?BMI 34.0-34.9,adult - Plan: Hemoglobin A1c, VITAMIN D 25 Hydroxy (Vit-D Deficiency, Fractures), Hepatic function panel, CBC with Differential/Platelet, Basic metabolic panel, Cortisol-am, blood, Cortisol-am, blood, Basic metabolic panel, CBC with  Differential/Platelet, Hepatic function panel, VITAMIN D 25 Hydroxy (Vit-D Deficiency, Fractures), Hemoglobin A1c ? ?Hyperglycemia - Plan: Hemoglobin A1c, VITAMIN D 25 Hydroxy (Vit-D Deficiency, Fractures), Hepatic function panel, CBC with Differential/Platelet, Basic metabolic panel, Cortisol-am, blood, Cortisol-am, blood, Basic metabolic panel, CBC with Differential/Platelet, Hepatic function panel, VITAMIN D 25 Hydroxy (Vit-D Deficiency, Fractures), Hemoglobin A1c ? ?Vitamin D deficiency - Plan: Hemoglobin A1c, VITAMIN D  25 Hydroxy (Vit-D Deficiency, Fractures), Hepatic function panel, CBC with Differential/Platelet, Basic metabolic panel, Cortisol-am, blood, Cortisol-am, blood, Basic metabolic panel, CBC with Differential/Platelet, Hepatic function panel, VITAMIN D 25 Hydroxy (Vit-D Deficiency, Fractures), Hemoglobin A1c ? ?Alkaline phosphatase elevation - Plan: Hemoglobin A1c, VITAMIN D 25 Hydroxy (Vit-D Deficiency, Fractures), Hepatic function panel, CBC with Differential/Platelet, Basic metabolic panel, Cortisol-am, blood, Cortisol-am, blood, Basic metabolic panel, CBC with Differential/Platelet, Hepatic function panel, VITAMIN D 25 Hydroxy (Vit-D Deficiency, Fractures), Hemoglobin A1c ?To continue tracking lab update for vitamin D deficiency alkaline phosphatase elevation hyperglycemia prediabetic range and obesity. ?Offered to use phentermine as needed on high risk days patient is aware would like a prescription. ?A.m. cortisol though at 10 AM discussed interaction of cortisol and reactive elevation versus primary abnormality.  Doubt that she has hypercortisolism primary. ?-Patient advised to return or notify health care team  if  new concerns arise. ? ?Patient Instructions  ?Good to see  you  today . ? ?Let me know if you want   to try prn phentermine .  ? ?Lab pending  and then go form there 6 months or as indicated  ? ? ?Standley Brooking. Hyrum Shaneyfelt M.D. ?

## 2022-02-27 ENCOUNTER — Ambulatory Visit: Payer: BC Managed Care – PPO | Admitting: Internal Medicine

## 2022-02-27 ENCOUNTER — Encounter: Payer: Self-pay | Admitting: Internal Medicine

## 2022-02-27 VITALS — BP 118/0 | HR 70 | Temp 98.5°F | Ht 64.75 in | Wt 208.0 lb

## 2022-02-27 DIAGNOSIS — Z79899 Other long term (current) drug therapy: Secondary | ICD-10-CM | POA: Diagnosis not present

## 2022-02-27 DIAGNOSIS — R739 Hyperglycemia, unspecified: Secondary | ICD-10-CM | POA: Diagnosis not present

## 2022-02-27 DIAGNOSIS — Z6834 Body mass index (BMI) 34.0-34.9, adult: Secondary | ICD-10-CM

## 2022-02-27 DIAGNOSIS — E559 Vitamin D deficiency, unspecified: Secondary | ICD-10-CM

## 2022-02-27 DIAGNOSIS — R748 Abnormal levels of other serum enzymes: Secondary | ICD-10-CM | POA: Diagnosis not present

## 2022-02-27 LAB — BASIC METABOLIC PANEL
BUN: 23 mg/dL (ref 6–23)
CO2: 26 mEq/L (ref 19–32)
Calcium: 9.4 mg/dL (ref 8.4–10.5)
Chloride: 105 mEq/L (ref 96–112)
Creatinine, Ser: 0.73 mg/dL (ref 0.40–1.20)
GFR: 94.09 mL/min (ref >60.00)
Glucose, Bld: 103 mg/dL — ABNORMAL HIGH (ref 70–99)
Potassium: 4.6 mEq/L (ref 3.5–5.1)
Sodium: 138 mEq/L (ref 135–145)

## 2022-02-27 LAB — VITAMIN D 25 HYDROXY (VIT D DEFICIENCY, FRACTURES): VITD: 69.97 ng/mL (ref 30.00–100.00)

## 2022-02-27 LAB — HEPATIC FUNCTION PANEL
ALT: 15 U/L (ref 0–35)
AST: 17 U/L (ref 0–37)
Albumin: 4.3 g/dL (ref 3.5–5.2)
Alkaline Phosphatase: 129 U/L — ABNORMAL HIGH (ref 39–117)
Bilirubin, Direct: 0.1 mg/dL (ref 0.0–0.3)
Total Bilirubin: 0.4 mg/dL (ref 0.2–1.2)
Total Protein: 7.2 g/dL (ref 6.0–8.3)

## 2022-02-27 LAB — CBC WITH DIFFERENTIAL/PLATELET
Basophils Absolute: 0 10*3/uL (ref 0.0–0.1)
Basophils Relative: 0.6 % (ref 0.0–3.0)
Eosinophils Absolute: 0.1 10*3/uL (ref 0.0–0.7)
Eosinophils Relative: 1.3 % (ref 0.0–5.0)
HCT: 43.6 % (ref 36.0–46.0)
Hemoglobin: 14.3 g/dL (ref 12.0–15.0)
Lymphocytes Relative: 20.2 % (ref 12.0–46.0)
Lymphs Abs: 1.6 10*3/uL (ref 0.7–4.0)
MCHC: 32.9 g/dL (ref 30.0–36.0)
MCV: 90 fl (ref 78.0–100.0)
Monocytes Absolute: 0.4 10*3/uL (ref 0.1–1.0)
Monocytes Relative: 5 % (ref 3.0–12.0)
Neutro Abs: 5.9 10*3/uL (ref 1.4–7.7)
Neutrophils Relative %: 72.9 % (ref 43.0–77.0)
Platelets: 253 10*3/uL (ref 150.0–400.0)
RBC: 4.85 Mil/uL (ref 3.87–5.11)
RDW: 12.9 % (ref 11.5–15.5)
WBC: 8.1 10*3/uL (ref 4.0–10.5)

## 2022-02-27 LAB — HEMOGLOBIN A1C: Hgb A1c MFr Bld: 5.9 % (ref 4.6–6.5)

## 2022-02-27 MED ORDER — PHENTERMINE HCL 30 MG PO CAPS: 30.0000 mg | ORAL_CAPSULE | ORAL | 0 refills | Status: DC

## 2022-02-27 NOTE — Patient Instructions (Signed)
Good to see  you  today . ? ?Let me know if you want   to try prn phentermine .  ? ?Lab pending  and then go form there 6 months or as indicated  ?

## 2022-02-28 LAB — CORTISOL-AM, BLOOD: Cortisol - AM: 5.9 ug/dL

## 2022-03-05 NOTE — Progress Notes (Signed)
Vit d is in good range , no diabetes,  but a1c slight elevation.  The alkaline phophatase is still borderline elevated. ( Like last 2 x )    ?Arrange another  fasting  blood: LFTS, gammaGGT, intact PTH .   If still off we may have endocrinology  consult. To decide if this is an important findings    Cortisol was normal .   (Alk phos can come from bone or liver or other conditions .   Liver panel is normal except this one reading. )

## 2022-03-06 ENCOUNTER — Other Ambulatory Visit: Payer: Self-pay

## 2022-03-06 DIAGNOSIS — R748 Abnormal levels of other serum enzymes: Secondary | ICD-10-CM

## 2022-03-07 ENCOUNTER — Encounter: Payer: Self-pay | Admitting: Internal Medicine

## 2022-03-07 ENCOUNTER — Other Ambulatory Visit (INDEPENDENT_AMBULATORY_CARE_PROVIDER_SITE_OTHER): Payer: BC Managed Care – PPO

## 2022-03-07 DIAGNOSIS — R748 Abnormal levels of other serum enzymes: Secondary | ICD-10-CM | POA: Diagnosis not present

## 2022-03-07 LAB — HEPATIC FUNCTION PANEL
ALT: 15 U/L (ref 0–35)
AST: 18 U/L (ref 0–37)
Albumin: 4.2 g/dL (ref 3.5–5.2)
Alkaline Phosphatase: 131 U/L — ABNORMAL HIGH (ref 39–117)
Bilirubin, Direct: 0.1 mg/dL (ref 0.0–0.3)
Total Bilirubin: 0.5 mg/dL (ref 0.2–1.2)
Total Protein: 7.1 g/dL (ref 6.0–8.3)

## 2022-03-07 LAB — GAMMA GT: GGT: 10 U/L (ref 7–51)

## 2022-03-08 LAB — PTH, INTACT AND CALCIUM
Calcium: 9.4 mg/dL (ref 8.6–10.4)
PTH: 30 pg/mL (ref 16–77)

## 2022-03-09 NOTE — Telephone Encounter (Signed)
So I want her to see an endocrinologist   to help delineate if any bone related condition causing this persistent  alkaline phosphatase elevation   May we do a referral to endocrinology?  please place referral  .if patient agrees

## 2022-03-09 NOTE — Telephone Encounter (Signed)
Patient informed of message and has agreed to see Endo,  referral has been placed. ?

## 2022-03-09 NOTE — Progress Notes (Signed)
See other message  ?"So I want her to see an endocrinologist   to help delineate if any bone related condition causing this persistent  alkaline phosphatase elevation   May we do a referral to endocrinology?  please place referral  .if patient agrees " ?

## 2022-03-13 ENCOUNTER — Ambulatory Visit: Payer: BC Managed Care – PPO | Admitting: Podiatry

## 2022-03-13 ENCOUNTER — Ambulatory Visit: Payer: BC Managed Care – PPO

## 2022-03-13 ENCOUNTER — Encounter: Payer: Self-pay | Admitting: Podiatry

## 2022-03-13 DIAGNOSIS — M7752 Other enthesopathy of left foot: Secondary | ICD-10-CM

## 2022-03-13 DIAGNOSIS — D3610 Benign neoplasm of peripheral nerves and autonomic nervous system, unspecified: Secondary | ICD-10-CM

## 2022-03-13 DIAGNOSIS — M779 Enthesopathy, unspecified: Secondary | ICD-10-CM

## 2022-03-13 DIAGNOSIS — M7751 Other enthesopathy of right foot: Secondary | ICD-10-CM

## 2022-03-13 NOTE — Progress Notes (Addendum)
SITUATION ?Reason for Consult: Evaluation for Bilateral Custom Foot Orthoses ?Patient / Caregiver Report: Patient is ready for foot orthotics ? ?OBJECTIVE DATA: ?Patient History / Diagnosis:  ?  ICD-10-CM   ?1. Capsulitis of metatarsophalangeal (MTP) joint of left foot  M77.52   ?  ?2. Neuroma  D36.10   ?  ?3. Tendonitis  M77.9   ?  ? ? ?Current or Previous Devices:   Current user ? ?Foot Examination: ?Skin presentation:   Intact ?Ulcers & Callousing:   None ?Toe / Foot Deformities:  Bunions, pes planus ?Weight Bearing Presentation:  planus ?Sensation:    Intact ? ?Shoe Size:    43M ? ?ORTHOTIC RECOMMENDATION ?Recommended Device: 1x pair of custom functional foot orthotics ? ?GOALS OF ORTHOSES ?- Reduce Pain ?- Prevent Foot Deformity ?- Prevent Progression of Further Foot Deformity ?- Relieve Pressure ?- Improve the Overall Biomechanical Function of the Foot and Lower Extremity. ? ?ACTIONS PERFORMED ?Potential out of pocket cost was communicated to patient. Patient understood and consent to casting. Patient was casted for Foot Orthoses via crush box. Procedure was explained and patient tolerated procedure well. Casts were shipped to central fabrication. All questions were answered and concerns addressed. ? ?PLAN ?Patient is to be called for fitting when devices are ready.  ? ? ?

## 2022-03-14 ENCOUNTER — Encounter: Payer: Self-pay | Admitting: Internal Medicine

## 2022-03-15 ENCOUNTER — Encounter: Payer: Self-pay | Admitting: Obstetrics & Gynecology

## 2022-03-15 DIAGNOSIS — N926 Irregular menstruation, unspecified: Secondary | ICD-10-CM

## 2022-03-15 NOTE — Progress Notes (Signed)
Subjective:  ? ?Patient ID: Brandy Maynard, female   DOB: 51 y.o.   MRN: [id number]  ? ?HPI ?Patient states the left foot is somewhat better but still hurting me quite a bit and it seems the pads really make a difference ? ? ?ROS ? ? ?   ?Objective:  ?Physical Exam  ?Neurovascular status intact with inflammation which mostly centered around the third metatarsal phalangeal joint left with inflammation fluid in the joint surface with a second doing better where she had had previous surgery ? ?   ?Assessment:  ?Inflammatory capsulitis third MPJ left with prominent metatarsal structure bilateral ? ?   ?Plan:  ?H&P reviewed condition recommended long-term orthotics to offload the metatarsal along with metatarsal padding and patient is casted for these today.  Patient will be seen back when those are returned ?   ? ? ?

## 2022-03-17 NOTE — Telephone Encounter (Signed)
Patient ultrasound scheduled on 04/18/22

## 2022-03-17 NOTE — Telephone Encounter (Signed)
Dr.Lavoie replied   "Given the irregularity of the bleeding, I recommend a Pelvic US to investigate asap.  Probably perimenopausal, but we need to exclude endometrial pathologies.  Is she on any HRT?  If so, I recommend to stop HRT.  We could start on the Progestin-only pill to control the bleeding until the Pelvic US.

## 2022-03-17 NOTE — Telephone Encounter (Signed)
Appointments per ML patient needs ultrasound see below. Order placed.

## 2022-04-10 ENCOUNTER — Telehealth: Payer: Self-pay | Admitting: Internal Medicine

## 2022-04-10 NOTE — Telephone Encounter (Signed)
FYI: Referral was sent to [facility] as requested by patient.  Patient was informed via [place].   Check Chart then go to Letters if you need proof or if patient calls asking information. Information is also stated in actual referral under the communication tab.  Please advise.

## 2022-04-17 ENCOUNTER — Ambulatory Visit (INDEPENDENT_AMBULATORY_CARE_PROVIDER_SITE_OTHER): Payer: BC Managed Care – PPO

## 2022-04-17 DIAGNOSIS — M7752 Other enthesopathy of left foot: Secondary | ICD-10-CM

## 2022-04-17 NOTE — Progress Notes (Signed)
SITUATION: Reason for Visit: Fitting and Delivery of Custom Fabricated Foot Orthoses Patient Report: Patient reports comfort and is satisfied with device.  OBJECTIVE DATA: Patient History / Diagnosis:     ICD-10-CM   1. Capsulitis of metatarsophalangeal (MTP) joint of left foot  M77.52       Provided Device:  Custom Functional Foot Orthotics     [facility]: [id number]  GOAL OF ORTHOSIS - Improve gait - Decrease energy expenditure - Improve Balance - Provide Triplanar stability of foot complex - Facilitate motion  ACTIONS PERFORMED Patient was fit with foot orthotics trimmed to shoe last. Patient tolerated fittign procedure.   Patient was provided with verbal and written instruction and demonstration regarding donning, doffing, wear, care, proper fit, function, purpose, cleaning, and use of the orthosis and in all related precautions and risks and benefits regarding the orthosis.  Patient was also provided with verbal instruction regarding how to report any failures or malfunctions of the orthosis and necessary follow up care. Patient was also instructed to contact our office regarding any change in status that may affect the function of the orthosis.  Patient demonstrated independence with proper donning, doffing, and fit and verbalized understanding of all instructions.  PLAN: Patient is to follow up in one week or as necessary (PRN). All questions were answered and concerns addressed. Plan of care was discussed with and agreed upon by the patient.

## 2022-04-18 ENCOUNTER — Ambulatory Visit: Payer: BC Managed Care – PPO

## 2022-04-18 DIAGNOSIS — N926 Irregular menstruation, unspecified: Secondary | ICD-10-CM

## 2022-04-24 ENCOUNTER — Ambulatory Visit: Payer: BC Managed Care – PPO | Admitting: Obstetrics & Gynecology

## 2022-04-24 ENCOUNTER — Encounter: Payer: Self-pay | Admitting: Obstetrics & Gynecology

## 2022-04-24 VITALS — BP 104/68 | HR 77

## 2022-04-24 DIAGNOSIS — N950 Postmenopausal bleeding: Secondary | ICD-10-CM

## 2022-04-24 DIAGNOSIS — N951 Menopausal and female climacteric states: Secondary | ICD-10-CM

## 2022-04-27 ENCOUNTER — Encounter: Payer: Self-pay | Admitting: Obstetrics & Gynecology

## 2022-04-28 MED ORDER — ESTRADIOL 0.0375 MG/24HR TD PTTW: 1.0000 | MEDICATED_PATCH | TRANSDERMAL | 4 refills | Status: DC

## 2022-04-28 MED ORDER — PROGESTERONE MICRONIZED 100 MG PO CAPS: 100.0000 mg | ORAL_CAPSULE | Freq: Every day | ORAL | 4 refills | Status: DC

## 2022-04-28 NOTE — Telephone Encounter (Signed)
Per note on 04/24/22 "Estradiol patch 0.0375 twice a week sent to pharmacy.  Patient will continue on progesterone 100 mg 1 tablet per mouth daily at bedtime."  Rx was not sent, Rx sent.

## 2022-06-21 ENCOUNTER — Other Ambulatory Visit: Payer: Self-pay | Admitting: Internal Medicine

## 2022-07-31 DIAGNOSIS — R635 Abnormal weight gain: Secondary | ICD-10-CM | POA: Diagnosis not present

## 2022-07-31 DIAGNOSIS — R748 Abnormal levels of other serum enzymes: Secondary | ICD-10-CM | POA: Diagnosis not present

## 2022-07-31 DIAGNOSIS — R7309 Other abnormal glucose: Secondary | ICD-10-CM | POA: Diagnosis not present

## 2022-08-01 DIAGNOSIS — R748 Abnormal levels of other serum enzymes: Secondary | ICD-10-CM | POA: Diagnosis not present

## 2022-08-01 DIAGNOSIS — R7309 Other abnormal glucose: Secondary | ICD-10-CM | POA: Diagnosis not present

## 2022-08-01 DIAGNOSIS — R635 Abnormal weight gain: Secondary | ICD-10-CM | POA: Diagnosis not present

## 2022-08-07 LAB — HEPATIC FUNCTION PANEL
ALT: 9 U/L (ref 7–35)
AST: 16 (ref 13–35)
Alkaline Phosphatase: 145 — AB (ref 25–125)
Bilirubin, Direct: 0.1
Bilirubin, Total: 0.3

## 2022-08-07 LAB — TSH: TSH: 1.99 (ref 0.41–5.90)

## 2022-08-07 LAB — VITAMIN D 25 HYDROXY (VIT D DEFICIENCY, FRACTURES): Vit D, 25-Hydroxy: 66.4

## 2022-09-06 ENCOUNTER — Encounter: Payer: Self-pay | Admitting: Internal Medicine

## 2022-09-06 ENCOUNTER — Ambulatory Visit (INDEPENDENT_AMBULATORY_CARE_PROVIDER_SITE_OTHER): Payer: BC Managed Care – PPO | Admitting: Internal Medicine

## 2022-09-06 VITALS — BP 104/80 | HR 85 | Temp 98.1°F | Ht 65.0 in | Wt 206.8 lb

## 2022-09-06 DIAGNOSIS — E7800 Pure hypercholesterolemia, unspecified: Secondary | ICD-10-CM | POA: Diagnosis not present

## 2022-09-06 DIAGNOSIS — Z6834 Body mass index (BMI) 34.0-34.9, adult: Secondary | ICD-10-CM

## 2022-09-06 DIAGNOSIS — Z23 Encounter for immunization: Secondary | ICD-10-CM | POA: Diagnosis not present

## 2022-09-06 DIAGNOSIS — Z0000 Encounter for general adult medical examination without abnormal findings: Secondary | ICD-10-CM | POA: Diagnosis not present

## 2022-09-06 DIAGNOSIS — Z79899 Other long term (current) drug therapy: Secondary | ICD-10-CM | POA: Diagnosis not present

## 2022-09-06 DIAGNOSIS — E785 Hyperlipidemia, unspecified: Secondary | ICD-10-CM

## 2022-09-06 LAB — LIPID PANEL
Cholesterol: 166 mg/dL (ref 0–200)
HDL: 49.9 mg/dL (ref >39.00)
LDL Cholesterol: 89 mg/dL (ref 0–99)
NonHDL: 115.85
Total CHOL/HDL Ratio: 3
Triglycerides: 136 mg/dL (ref 0.0–149.0)
VLDL: 27.2 mg/dL (ref 0.0–40.0)

## 2022-09-06 NOTE — Progress Notes (Signed)
Lipid improved  ldl . Forwarding info to Dr Chalmers Cater

## 2022-09-06 NOTE — Patient Instructions (Addendum)
Good to see you today .  Tdap dis due when you wish. Lipid panel today  otherwise as per Dr Chalmers Cater . Agree with healthy weight loss .

## 2022-09-06 NOTE — Progress Notes (Signed)
Chief Complaint  Patient presents with   Annual Exam    HPI: Patient  Brandy Maynard  51 y.o. comes in today for Preventive Health Care visit  Meds prescribed by PCP Phnetermine alprazolam metformin increased    to 2 per day GYNe: hrt  evaluated     and dose change Allergy  seasonal . Fatty liver  per  Dr Chalmers Cater.  :   advised saxenda or wegovy .    Declining at this point.  Referred to nutrition.  .    Working on [place]  and reassess on feb 6   .   Health Maintenance  Topic Date Due   HIV Screening  Never done   Hepatitis C Screening  Never done   Zoster Vaccines- Shingrix (1 of 2) Never done   TETANUS/TDAP  04/29/2022   COVID-19 Vaccine (3 - Pfizer risk series) 09/22/2022 (Originally 08/22/2020)   INFLUENZA VACCINE  01/28/2023 (Originally 05/30/2022)   MAMMOGRAM  09/15/2022   PAP SMEAR-Modifier  11/17/2022   COLONOSCOPY (Pts 45-26yr Insurance coverage will need to be confirmed)  11/11/2025   HPV VACCINES  Aged Out   Health Maintenance Review LIFESTYLE:  Exercise:   3 d per week  in past   but had covid recovering  Tobacco/ETS: no Alcohol:  ocass Sugar beverages: no Sleep: 6.5-7.5 Drug use: no HH of  Work:    ROS:  REST of 12 system review negative except as per HPI   Past Medical History:  Diagnosis Date   Allergy    Anemia    ANEMIA 12/15/2009   Qualifier: Diagnosis of  By: PRegis BillMD, WStandley Brooking   Depression    GERD (gastroesophageal reflux disease)    Heavy menses    on ocps   IBS (irritable bowel syndrome)    by hx and had a neg colonscopy   Migraines     Past Surgical History:  Procedure Laterality Date   CHOLECYSTECTOMY     COLONOSCOPY  08/03/2005   COLONOSCOPY W/ BIOPSIES  Nov 12 2015   ESOPHAGOGASTRODUODENOSCOPY ENDOSCOPY  Nov 12 2015   FOOT SURGERY     KNEE ARTHROSCOPY  2012   right knee,,, left knee at 15   LIGAMENT REPAIR  2002   Rt ankle   PELVIC LAPAROSCOPY  10/11/2005   LAPAROTOMY, LYSIS OF PELVIC ADHESIONS, RSO AND CAUTERIZATION AND  TRANSECTION OF LEFT FALLOPIAN TUBE   PLICA, arthritis and cartilage repair  01/11/2011   on Rt knee   TUBAL LIGATION  09/2005   scar tissue removal ovarian cyst    Family History  Problem Relation Age of Onset   Arthritis Mother    Hypertension Mother    Cancer Mother        MELANOMA   Hyperlipidemia Mother    Non-Hodgkin's lymphoma Mother    Heart disease Father    Hypertension Maternal Grandmother    Dementia Maternal Grandmother    Hypertension Maternal Grandfather    Cancer Maternal Grandfather        MELANOMA   Heart disease Paternal Grandmother    Diabetes Paternal Grandfather    Heart disease Paternal Grandfather    Cancer - Ovarian Other        Mat Great Grandmother    Social History   Socioeconomic History   Marital status: Married    Spouse name: Not on file   Number of children: Not on file   Years of education: Not on file   Highest education  level: Not on file  Occupational History   Not on file  Tobacco Use   Smoking status: Never   Smokeless tobacco: Never  Vaping Use   Vaping Use: Never used  Substance and Sexual Activity   Alcohol use: Not Currently    Comment: 1-2 DRINKS A MONTH    Drug use: No   Sexual activity: Yes    Partners: Male    Birth control/protection: Surgical    Comment: 1st intercourse- 15, parnters- 3, married- 29 yrs, tubal  Other Topics Concern   Not on file  Social History Narrative   Occupation: unemployed   Married   [place] of 3-4    Pet dogs 2   G3P2         Social Determinants of Radio broadcast assistant Strain: Not on file  Food Insecurity: Not on file  Transportation Needs: Not on file  Physical Activity: Not on file  Stress: Not on file  Social Connections: Not on file    Outpatient Medications Prior to Visit  Medication Sig Dispense Refill   cetirizine (ZYRTEC) 10 MG tablet Take 10 mg by mouth daily.     Cholecalciferol (VITAMIN D3 PO) Take by mouth.     Cholecalciferol 50 MCG (2000 UT) CAPS 1  capsule Orally Once a day     [employer] SUNSCREEN SPF 30 EX apply     estradiol (VIVELLE-DOT) 0.0375 MG/24HR Place 1 patch onto the skin 2 (two) times a week. 24 patch 4   fluticasone (FLONASE) 50 MCG/ACT nasal spray Place into both nostrils as needed for allergies or rhinitis.     metFORMIN (GLUCOPHAGE) 500 MG tablet 1 tablet with a meal Orally Once a day for 30 day(s)     phentermine 30 MG capsule TAKE 1 CAPSULE(30 MG) BY MOUTH EVERY MORNING AFTER BREAKFAST 30 capsule 0   Probiotic Product (PROBIOTIC DAILY PO) Take by mouth.     progesterone (PROMETRIUM) 100 MG capsule Take 1 capsule (100 mg total) by mouth at bedtime. 90 capsule 4   ALPRAZolam (XANAX) 0.5 MG tablet Take 1 tablet (0.5 mg total) by mouth 3 (three) times daily as needed for anxiety. (Patient not taking: Reported on 09/06/2022) 24 tablet 0   omeprazole (PRILOSEC OTC) 20 MG tablet Take 20 mg by mouth. Every other day     No facility-administered medications prior to visit.     EXAM:  BP 104/80 (BP Location: Right Arm, Patient Position: Sitting, Cuff Size: Normal)   Pulse 85   Temp 98.1 F (36.7 C) (Oral)   Ht '5\' 5"'$  (1.651 m)   Wt 206 lb 12.8 oz (93.8 kg)   LMP 06/09/2022 (Approximate)   SpO2 97%   BMI 34.41 kg/m   Body mass index is 34.41 kg/m. Wt Readings from Last 3 Encounters:  09/06/22 206 lb 12.8 oz (93.8 kg)  02/27/22 208 lb (94.3 kg)  11/17/21 208 lb (94.3 kg)    Physical Exam: Vital signs reviewed IDP:OEUM is a well-developed well-nourished alert cooperative    who appearsr stated age in no acute distress.  HEENT: normocephalic atraumatic , Eyes: PERRL EOM's full, conjunctiva clear, Nares: paten,t no deformity discharge or tenderness., Ears: no deformity EAC's clear TMs with normal landmarks. Mouth: clear OP, no lesions, edema.  Moist mucous membranes. Dentition in adequate repair. NECK: supple without masses, thyromegaly or bruits. CHEST/PULM:  Clear to auscultation and percussion breath sounds equal no  wheeze , rales or rhonchi. No chest wall deformities or tenderness. Breast: normal  by inspection . No dimpling, discharge, masses, tenderness or discharge . CV: PMI is nondisplaced, S1 S2 no gallops, murmurs, rubs. Peripheral pulses are full without delay.No JVD .  ABDOMEN: Bowel sounds normal nontender  No guard or rebound, no hepato splenomegal no CVA tenderness.  Extremtities:  No clubbing cyanosis or edema, no acute joint swelling or redness no focal atrophy NEURO:  Oriented x3, cranial nerves 3-12 appear to be intact, no obvious focal weakness,gait within normal limits no abnormal reflexes or asymmetrical SKIN: No acute rashes normal turgor, color, no bruising or petechiae. PSYCH: Oriented, good eye contact, no obvious depression anxiety, cognition and judgment appear normal. LN: no cervical axillary inguinal adenopathy  Lab Results  Component Value Date   WBC 8.1 02/27/2022   HGB 14.3 02/27/2022   HCT 43.6 02/27/2022   PLT 253.0 02/27/2022   GLUCOSE 103 (H) 02/27/2022   CHOL 178 08/30/2021   TRIG 98.0 08/30/2021   HDL 55.10 08/30/2021   LDLCALC 104 (H) 08/30/2021   ALT 15 03/07/2022   AST 18 03/07/2022   NA 138 02/27/2022   K 4.6 02/27/2022   CL 105 02/27/2022   CREATININE 0.73 02/27/2022   BUN 23 02/27/2022   CO2 26 02/27/2022   TSH 2.94 08/30/2021   HGBA1C 5.9 02/27/2022    BP Readings from Last 3 Encounters:  09/06/22 104/80  04/24/22 104/68  02/27/22 (!) 118/0    Lab planreviewed with patient   ASSESSMENT AND PLAN:  Discussed the following assessment and plan:    ICD-10-CM   1. Visit for preventive health examination  Z00.00 Lipid panel    Lipid panel    2. Medication management  Z79.899 Lipid panel    Lipid panel    3. Elevated LDL cholesterol level  E78.00 Lipid panel    Lipid panel    4. BMI 34.0-34.9,adult  Z68.34     Discussed reviewed plan she will continue lifestyle intervention and follow-up with Dr. Michiel Sites medication is indicated. Lipid  panel today fasting If stable yearly visit Return in about 1 year (around 09/07/2023) for depending on results.  Patient Care Team: Brandy Maynard, Standley Brooking, MD as PCP - General (Internal Medicine) Vickey Huger, MD (Orthopedic Surgery) Jerrell Belfast, MD (Otolaryngology) Jacelyn Pi, MD as Referring Physician (Endocrinology) Patient Instructions  Good to see you today .  Tdap dis due when you wish. Lipid panel today  otherwise as per Dr Chalmers Cater . Agree with healthy weight loss .  Standley Brooking. Brandy Maynard M.D.

## 2022-09-12 ENCOUNTER — Encounter: Payer: Self-pay | Admitting: Internal Medicine

## 2022-09-19 DIAGNOSIS — Z1231 Encounter for screening mammogram for malignant neoplasm of breast: Secondary | ICD-10-CM | POA: Diagnosis not present

## 2022-09-20 NOTE — Addendum Note (Signed)
Addended by: Encarnacion Slates on: 09/20/2022 08:31 AM   Modules accepted: Orders

## 2022-09-25 ENCOUNTER — Encounter: Payer: Self-pay | Admitting: Obstetrics & Gynecology

## 2022-09-27 DIAGNOSIS — R7309 Other abnormal glucose: Secondary | ICD-10-CM | POA: Diagnosis not present

## 2022-11-01 DIAGNOSIS — R7309 Other abnormal glucose: Secondary | ICD-10-CM | POA: Diagnosis not present

## 2022-11-28 DIAGNOSIS — R635 Abnormal weight gain: Secondary | ICD-10-CM | POA: Diagnosis not present

## 2022-11-28 DIAGNOSIS — R7309 Other abnormal glucose: Secondary | ICD-10-CM | POA: Diagnosis not present

## 2022-12-05 DIAGNOSIS — R748 Abnormal levels of other serum enzymes: Secondary | ICD-10-CM | POA: Diagnosis not present

## 2022-12-05 DIAGNOSIS — R7309 Other abnormal glucose: Secondary | ICD-10-CM | POA: Diagnosis not present

## 2022-12-06 DIAGNOSIS — R7309 Other abnormal glucose: Secondary | ICD-10-CM | POA: Diagnosis not present

## 2023-01-10 ENCOUNTER — Ambulatory Visit (INDEPENDENT_AMBULATORY_CARE_PROVIDER_SITE_OTHER): Payer: BC Managed Care – PPO | Admitting: Obstetrics & Gynecology

## 2023-01-10 ENCOUNTER — Encounter: Payer: Self-pay | Admitting: Obstetrics & Gynecology

## 2023-01-10 VITALS — BP 118/74 | HR 88 | Ht 64.75 in | Wt 206.0 lb

## 2023-01-10 DIAGNOSIS — E6609 Other obesity due to excess calories: Secondary | ICD-10-CM | POA: Diagnosis not present

## 2023-01-10 DIAGNOSIS — Z01419 Encounter for gynecological examination (general) (routine) without abnormal findings: Secondary | ICD-10-CM

## 2023-01-10 DIAGNOSIS — Z780 Asymptomatic menopausal state: Secondary | ICD-10-CM

## 2023-01-10 DIAGNOSIS — N950 Postmenopausal bleeding: Secondary | ICD-10-CM

## 2023-01-10 MED ORDER — PROGESTERONE MICRONIZED 100 MG PO CAPS: 100.0000 mg | ORAL_CAPSULE | Freq: Every day | ORAL | 4 refills | Status: DC

## 2023-01-10 NOTE — Progress Notes (Signed)
Brandy Maynard Aug 21, 1971 [id number]   History:    52 y.o.  [id number]  Married   RP:  Established patient presenting for annual gyn exam    HPI:  Menopausal syndrome, was on Estradiol patch/Prometrium when started bleeding in 09/2022, it lasted 11 days with moderate to heavy flow.  She stopped the Estradiol patch at that time and continued on Prometrium 100 mg HS only.  No PMB since then.  No pelvic pain.  No pain with IC.  Pap 10/2021 Negative. No h/o abnormal Pap. Will repeat at 1 yrs. Breasts normal.  Mammo Neg 08/2022.  BMI 34.55.  Losing weight on a low calorie/carb diet with physical activities, followed by Dr Chalmers Cater.  Health labs with Fam MD.  [name] 2018 Benign polyps.  Past medical history,surgical history, family history and social history were all reviewed and documented in the EPIC chart.  Gynecologic History Patient's last menstrual period was 10/12/2022 (exact date).  Obstetric History OB History  Gravida Para Term Preterm AB Living  '3 2 2   1 2  '$ SAB IAB Ectopic Multiple Live Births  1       2    # Outcome Date GA Lbr Len/2nd Weight Sex Delivery Anes PTL Lv  3 SAB           2 Term     F Vag-Spont  N LIV  1 Term     F Vag-Spont  N LIV     ROS: A ROS was performed and pertinent positives and negatives are included in the history. GENERAL: No fevers or chills. HEENT: No change in vision, no earache, sore throat or sinus congestion. NECK: No pain or stiffness. CARDIOVASCULAR: No chest pain or pressure. No palpitations. PULMONARY: No shortness of breath, cough or wheeze. GASTROINTESTINAL: No abdominal pain, nausea, vomiting or diarrhea, melena or bright red blood per rectum. GENITOURINARY: No urinary frequency, urgency, hesitancy or dysuria. MUSCULOSKELETAL: No joint or muscle pain, no back pain, no recent trauma. DERMATOLOGIC: No rash, no itching, no lesions. ENDOCRINE: No polyuria, polydipsia, no heat or cold intolerance. No recent change in weight. HEMATOLOGICAL: No anemia or  easy bruising or bleeding. NEUROLOGIC: No headache, seizures, numbness, tingling or weakness. PSYCHIATRIC: No depression, no loss of interest in normal activity or change in sleep pattern.     Exam:   BP 118/74   Pulse 88   Ht 5' 4.75" (1.645 m)   Wt 206 lb (93.4 kg)   LMP 10/12/2022 (Exact Date) Comment: sexually active-btl  SpO2 99%   BMI 34.55 kg/m   Body mass index is 34.55 kg/m.  General appearance : Well developed well nourished female. No acute distress HEENT: Eyes: no retinal hemorrhage or exudates,  Neck supple, trachea midline, no carotid bruits, no thyroidmegaly Lungs: Clear to auscultation, no rhonchi or wheezes, or rib retractions  Heart: Regular rate and rhythm, no murmurs or gallops Breast:Examined in sitting and supine position were symmetrical in appearance, no palpable masses or tenderness,  no skin retraction, no nipple inversion, no nipple discharge, no skin discoloration, no axillary or supraclavicular lymphadenopathy Abdomen: no palpable masses or tenderness, no rebound or guarding Extremities: no edema or skin discoloration or tenderness  Pelvic: Vulva: Normal             Vagina: No gross lesions or discharge  Cervix: No gross lesions or discharge  Uterus  AV, normal size, shape and consistency, non-tender and mobile  Adnexa  Without masses or tenderness  Anus: Normal  Assessment/Plan:  52 y.o. female for annual exam   1. Well female exam with routine gynecological exam Menopausal syndrome, was on Estradiol patch/Prometrium when started bleeding in 09/2022, it lasted 11 days with moderate to heavy flow.  She stopped the Estradiol patch at that time and continued on Prometrium 100 mg HS only.  No PMB since then.  No pelvic pain.  No pain with IC.  Pap 10/2021 Negative. No h/o abnormal Pap. Will repeat at 1 yrs. Breasts normal.  Mammo Neg 08/2022.  BMI 34.55.  Losing weight on a low calorie/carb diet with physical activities, followed by Dr Chalmers Cater.  Health  labs with Fam MD.  [name] 2018 Benign polyps.  2. Postmenopause No PMB since stopping the Estradiol patch.  Will f/u with a Pelvic US to investigate.  Continue on Prometrium 100 mg HS.  3. Postmenopausal bleeding Menopausal syndrome, was on Estradiol patch/Prometrium when started bleeding in 09/2022, it lasted 11 days with moderate to heavy flow.  She stopped the Estradiol patch at that time and continued on Prometrium 100 mg HS only.  No PMB since then.  No pelvic pain.  No pain with IC.  Normal gyn exam today.  Will further investigate with a Pelvic US at f/u, possible EBx. - US Transvaginal Non-OB; Future  4. Class 1 obesity due to excess calories without serious comorbidity with body mass index (BMI) of 34.0 to 34.9 in adult Low calorie/carb diet.  Increased fitness activities.  Followed by Dr Chalmers Cater.  Other orders - ASHWAGANDHA PO; Take by mouth.  - progesterone (PROMETRIUM) 100 MG capsule; Take 1 capsule (100 mg total) by mouth at bedtime.   Princess Bruins MD, 11:46 AM

## 2023-02-06 DIAGNOSIS — L578 Other skin changes due to chronic exposure to nonionizing radiation: Secondary | ICD-10-CM | POA: Diagnosis not present

## 2023-02-06 DIAGNOSIS — L814 Other melanin hyperpigmentation: Secondary | ICD-10-CM | POA: Diagnosis not present

## 2023-02-06 DIAGNOSIS — Z86018 Personal history of other benign neoplasm: Secondary | ICD-10-CM | POA: Diagnosis not present

## 2023-02-06 DIAGNOSIS — D225 Melanocytic nevi of trunk: Secondary | ICD-10-CM | POA: Diagnosis not present

## 2023-03-01 ENCOUNTER — Ambulatory Visit (INDEPENDENT_AMBULATORY_CARE_PROVIDER_SITE_OTHER): Payer: BC Managed Care – PPO | Admitting: Obstetrics & Gynecology

## 2023-03-01 ENCOUNTER — Encounter: Payer: Self-pay | Admitting: Obstetrics & Gynecology

## 2023-03-01 ENCOUNTER — Ambulatory Visit (INDEPENDENT_AMBULATORY_CARE_PROVIDER_SITE_OTHER): Payer: BC Managed Care – PPO

## 2023-03-01 VITALS — BP 114/70 | HR 72

## 2023-03-01 DIAGNOSIS — N951 Menopausal and female climacteric states: Secondary | ICD-10-CM | POA: Diagnosis not present

## 2023-03-01 DIAGNOSIS — N950 Postmenopausal bleeding: Secondary | ICD-10-CM

## 2023-03-01 MED ORDER — ESTRADIOL 0.025 MG/24HR TD PTTW: 1.0000 | MEDICATED_PATCH | TRANSDERMAL | 4 refills | Status: DC

## 2023-03-01 NOTE — Progress Notes (Signed)
    Brandy Maynard 05/08/1971 [id number]        52 y.o.  [id number]   RP: PMB on HRT for Pelvic US  HPI: Menopausal syndrome, was on Estradiol patch/Prometrium when started bleeding in 09/2022, it lasted 11 days with moderate to heavy flow.  She stopped the Estradiol patch at that time and continued on Prometrium 100 mg HS only.  No PMB since then.  No pelvic pain.  No pain with IC.  Has significant hot flushes and night sweats since stopped the Estradiol patch.   OB History  Gravida Para Term Preterm AB Living  3 2 2   1 2   SAB IAB Ectopic Multiple Live Births  1       2    # Outcome Date GA Lbr Len/2nd Weight Sex Delivery Anes PTL Lv  3 SAB           2 Term     F Vag-Spont  N LIV  1 Term     F Vag-Spont  N LIV    Past medical history,surgical history, problem list, medications, allergies, family history and social history were all reviewed and documented in the EPIC chart.   Directed ROS with pertinent positives and negatives documented in the history of present illness/assessment and plan.  Exam:  Vitals:   03/01/23 0956  BP: 114/70  Pulse: 72  SpO2: 99%   General appearance:  Normal  Pelvic US today: T/V images.  No latex used.  Anteverted uterus normal in size and shape with no myometrial mass.  The uterus is measured at 7.42 x 4.63 x 3.99 cm.  The endometrial lining is avascular, thin and symmetrical with no mass or thickening seen.  The endometrial lining is measured at 3.14 mm.  Right ovary is surgically absent.  Left ovary normal with a simple follicle.  No adnexal mass seen.  No free fluid in the pelvis.   Assessment/Plan:  52 y.o. [id number]   1. Postmenopausal bleeding Menopausal syndrome, was on Estradiol patch/Prometrium when started bleeding in 09/2022, it lasted 11 days with moderate to heavy flow.  She stopped the Estradiol patch at that time and continued on Prometrium 100 mg HS only.  No PMB since then.  No pelvic pain.  No pain with IC.  Has significant hot  flushes and night sweats since stopped the Estradiol patch.  Pelvic US findings thoroughly reviewed, patient reassured.  2. Menopausal syndrome On Prometrium 100 mg HS only.  No PMB since then.  No pelvic pain.  No pain with IC.  Has significant hot flushes and night sweats since stopped the Estradiol patch.  Given the reassurance of her thin normal endometrial line at 3.14 mm and no PMB since 09/2022 in a context of significant menopausal Sxs with hot flushes and night sweats, patient would like to restart the Estradiol patch at the lowest dosage.  No CI to HRT.  Estradiol patch 0.025 twice a week prescription sent to pharmacy.  Will continue with Prometrium 100 mg PO HS.  PMB precautions reviewed.  Other orders - COLLAGEN PO; Take by mouth. - estradiol (VIVELLE-DOT) 0.025 MG/24HR; Place 1 patch onto the skin 2 (two) times a week.   Brandy Del MD, 10:07 AM 03/01/2023

## 2023-03-22 DIAGNOSIS — R7309 Other abnormal glucose: Secondary | ICD-10-CM | POA: Diagnosis not present

## 2023-04-26 DIAGNOSIS — R7309 Other abnormal glucose: Secondary | ICD-10-CM | POA: Diagnosis not present

## 2023-05-02 ENCOUNTER — Other Ambulatory Visit: Payer: Self-pay | Admitting: Obstetrics & Gynecology

## 2023-05-07 NOTE — Telephone Encounter (Signed)
Medication refill request: progesterone  Last AEX:  01/10/23 Next AEX: not scheduled  Last MMG (if hormonal medication request): 09/19/22 normal  Refill authorized: #[id number] with 3 rf pended for today

## 2023-05-26 DIAGNOSIS — Z20822 Contact with and (suspected) exposure to covid-19: Secondary | ICD-10-CM | POA: Diagnosis not present

## 2023-05-26 DIAGNOSIS — R0981 Nasal congestion: Secondary | ICD-10-CM | POA: Diagnosis not present

## 2023-05-26 DIAGNOSIS — J029 Acute pharyngitis, unspecified: Secondary | ICD-10-CM | POA: Diagnosis not present

## 2023-05-26 DIAGNOSIS — J019 Acute sinusitis, unspecified: Secondary | ICD-10-CM | POA: Diagnosis not present

## 2023-06-05 DIAGNOSIS — R7309 Other abnormal glucose: Secondary | ICD-10-CM | POA: Diagnosis not present

## 2023-06-05 DIAGNOSIS — R635 Abnormal weight gain: Secondary | ICD-10-CM | POA: Diagnosis not present

## 2023-06-19 DIAGNOSIS — R7309 Other abnormal glucose: Secondary | ICD-10-CM | POA: Diagnosis not present

## 2023-06-19 DIAGNOSIS — R748 Abnormal levels of other serum enzymes: Secondary | ICD-10-CM | POA: Diagnosis not present

## 2023-06-19 DIAGNOSIS — R635 Abnormal weight gain: Secondary | ICD-10-CM | POA: Diagnosis not present

## 2023-06-19 DIAGNOSIS — R140 Abdominal distension (gaseous): Secondary | ICD-10-CM | POA: Diagnosis not present

## 2023-06-20 DIAGNOSIS — R7309 Other abnormal glucose: Secondary | ICD-10-CM | POA: Diagnosis not present

## 2023-06-27 DIAGNOSIS — R748 Abnormal levels of other serum enzymes: Secondary | ICD-10-CM | POA: Diagnosis not present

## 2023-07-02 ENCOUNTER — Encounter: Payer: Self-pay | Admitting: Internal Medicine

## 2023-07-05 NOTE — Telephone Encounter (Signed)
Maybe  can rx but need more info  .  I usually do a virtual if never had rx before .  But I wont be advisable until Tuesday. Get more information ito be able to help.

## 2023-07-06 ENCOUNTER — Telehealth: Payer: Self-pay

## 2023-07-06 NOTE — Telephone Encounter (Signed)
Spoke to pt and offer a video visit to discuss further with provider. Pt declined and states she is still paying her husband hospital bill and prefers not to have a virtual visit. Pt continues to states that its okay if provider not able to prescribe medication.   Inform pt that I will forward her mychart message to provider and go from there.

## 2023-07-09 MED ORDER — ACETAZOLAMIDE 125 MG PO TABS: ORAL_TABLET | ORAL | 0 refills | Status: DC

## 2023-07-09 NOTE — Telephone Encounter (Signed)
I sent in med for prevention   Make sure hydrated and slow ascent if getting symptoms

## 2023-08-16 DIAGNOSIS — R7309 Other abnormal glucose: Secondary | ICD-10-CM | POA: Diagnosis not present

## 2023-09-17 ENCOUNTER — Encounter: Payer: BC Managed Care – PPO | Admitting: Internal Medicine

## 2023-09-17 NOTE — Progress Notes (Unsigned)
No chief complaint on file.   HPI: Patient  Brandy Maynard  52 y.o. comes in today for Preventive Health Care visit   Health Maintenance  Topic Date Due   HIV Screening  Never done   Hepatitis C Screening  Never done   Zoster Vaccines- Shingrix (1 of 2) Never done   COVID-19 Vaccine (3 - Pfizer risk series) 08/22/2020   Cervical Cancer Screening (Pap smear)  11/17/2022   INFLUENZA VACCINE  05/31/2023   MAMMOGRAM  09/20/2023   Colonoscopy  11/11/2025   DTaP/Tdap/Td (3 - Td or Tdap) 09/06/2032   HPV VACCINES  Aged Out   Health Maintenance Review LIFESTYLE:  Exercise:   Tobacco/ETS: Alcohol:  Sugar beverages: Sleep: Drug use: no HH of  Work:    ROS:  GEN/ HEENT: No fever, significant weight changes sweats headaches vision problems hearing changes, CV/ PULM; No chest pain shortness of breath cough, syncope,edema  change in exercise tolerance. GI /GU: No adominal pain, vomiting, change in bowel habits. No blood in the stool. No significant GU symptoms. SKIN/HEME: ,no acute skin rashes suspicious lesions or bleeding. No lymphadenopathy, nodules, masses.  NEURO/ PSYCH:  No neurologic signs such as weakness numbness. No depression anxiety. IMM/ Allergy: No unusual infections.  Allergy .   REST of 12 system review negative except as per HPI   Past Medical History:  Diagnosis Date   Allergy    Anemia    ANEMIA 12/15/2009   Qualifier: Diagnosis of  By: Fabian Sharp MD, Neta Mends    Depression    Fatty liver    GERD (gastroesophageal reflux disease)    Heavy menses    on ocps   IBS (irritable bowel syndrome)    by hx and had a neg colonscopy   Migraines     Past Surgical History:  Procedure Laterality Date   CHOLECYSTECTOMY     COLONOSCOPY  08/03/2005   COLONOSCOPY W/ BIOPSIES  Nov 12 2015   ESOPHAGOGASTRODUODENOSCOPY ENDOSCOPY  Nov 12 2015   FOOT SURGERY     KNEE ARTHROSCOPY  2012   right knee,,, left knee at 15   LIGAMENT REPAIR  2002   Rt ankle   PELVIC  LAPAROSCOPY  10/11/2005   LAPAROTOMY, LYSIS OF PELVIC ADHESIONS, RSO AND CAUTERIZATION AND TRANSECTION OF LEFT FALLOPIAN TUBE   PLICA, arthritis and cartilage repair  01/11/2011   on Rt knee   TUBAL LIGATION  09/2005   scar tissue removal ovarian cyst    Family History  Problem Relation Age of Onset   Arthritis Mother    Hypertension Mother    Cancer Mother        MELANOMA   Hyperlipidemia Mother    Non-Hodgkin's lymphoma Mother    Heart disease Father    Hypertension Maternal Grandmother    Dementia Maternal Grandmother    Hypertension Maternal Grandfather    Cancer Maternal Grandfather        MELANOMA   Heart disease Paternal Grandmother    Diabetes Paternal Grandfather    Heart disease Paternal Grandfather    Cancer - Ovarian Other        [employer] researcher, broadcasting/film/video    Social History   Socioeconomic History   Marital status: Married    Spouse name: Not on file   Number of children: Not on file   Years of education: Not on file   Highest education level: Not on file  Occupational History   Not on file  Tobacco Use  Smoking status: Never   Smokeless tobacco: Never  Vaping Use   Vaping status: Never Used  Substance and Sexual Activity   Alcohol use: Yes    Comment: 1-2 DRINKS A MONTH    Drug use: No   Sexual activity: Yes    Partners: Male    Birth control/protection: Surgical    Comment: 1st intercourse- 15, parnters- 3, tubal  Other Topics Concern   Not on file  Social History Narrative   Occupation: unemployed   Married   HH of 3-4    Pet dogs 2   G3P2         Social Determinants of Corporate investment banker Strain: Not on file  Food Insecurity: Not on file  Transportation Needs: Not on file  Physical Activity: Not on file  Stress: Not on file  Social Connections: Not on file    Outpatient Medications Prior to Visit  Medication Sig Dispense Refill   acetaZOLAMIDE (DIAMOX) 125 MG tablet 125 mg orally every 12 hours beginning the day prior to  ascent; if remaining at target elevation, continue for 2 days if ascent was gradual or for 2 to 4 days if ascent was faster than recommended rates. 30 tablet 0   ALPRAZolam (XANAX) 0.5 MG tablet Take 1 tablet (0.5 mg total) by mouth 3 (three) times daily as needed for anxiety. 24 tablet 0   ASHWAGANDHA PO Take by mouth.     cetirizine (ZYRTEC) 10 MG tablet Take 10 mg by mouth daily.     Cholecalciferol 50 MCG (2000 UT) CAPS Vitamin d3  + k2     COLLAGEN PO Take by mouth.     estradiol (VIVELLE-DOT) 0.025 MG/24HR Place 1 patch onto the skin 2 (two) times a week. 24 patch 4   fluticasone (FLONASE) 50 MCG/ACT nasal spray Place into both nostrils as needed for allergies or rhinitis.     metFORMIN (GLUCOPHAGE) 500 MG tablet 1,000 mg.     Probiotic Product (PROBIOTIC DAILY PO) Take by mouth.     progesterone (PROMETRIUM) 100 MG capsule TAKE 1 CAPSULE(100 MG) BY MOUTH AT BEDTIME 90 capsule 3   No facility-administered medications prior to visit.     EXAM:  There were no vitals taken for this visit. Wt Readings from Last 3 Encounters:  01/10/23 206 lb (93.4 kg)  09/06/22 206 lb 12.8 oz (93.8 kg)  02/27/22 208 lb (94.3 kg)    There is no height or weight on file to calculate BMI. Wt Readings from Last 3 Encounters:  01/10/23 206 lb (93.4 kg)  09/06/22 206 lb 12.8 oz (93.8 kg)  02/27/22 208 lb (94.3 kg)    Physical Exam: Vital signs reviewed ZOX:WRUE is a well-developed well-nourished alert cooperative    who appearsr stated age in no acute distress.  HEENT: normocephalic atraumatic , Eyes: PERRL EOM's full, conjunctiva clear, Nares: paten,t no deformity discharge or tenderness., Ears: no deformity EAC's clear TMs with normal landmarks. Mouth: clear OP, no lesions, edema.  Moist mucous membranes. Dentition in adequate repair. NECK: supple without masses, thyromegaly or bruits. CHEST/PULM:  Clear to auscultation and percussion breath sounds equal no wheeze , rales or rhonchi. No chest wall  deformities or tenderness. Breast: normal by inspection . No dimpling, discharge, masses, tenderness or discharge . CV: PMI is nondisplaced, S1 S2 no gallops, murmurs, rubs. Peripheral pulses are full without delay.No JVD .  ABDOMEN: Bowel sounds normal nontender  No guard or rebound, no hepato splenomegal no CVA tenderness.  No hernia. Extremtities:  No clubbing cyanosis or edema, no acute joint swelling or redness no focal atrophy NEURO:  Oriented x3, cranial nerves 3-12 appear to be intact, no obvious focal weakness,gait within normal limits no abnormal reflexes or asymmetrical SKIN: No acute rashes normal turgor, color, no bruising or petechiae. PSYCH: Oriented, good eye contact, no obvious depression anxiety, cognition and judgment appear normal. LN: no cervical axillary inguinal adenopathy  Lab Results  Component Value Date   WBC 8.1 02/27/2022   HGB 14.3 02/27/2022   HCT 43.6 02/27/2022   PLT 253.0 02/27/2022   GLUCOSE 103 (H) 02/27/2022   CHOL 166 09/06/2022   TRIG 136.0 09/06/2022   HDL 49.90 09/06/2022   LDLCALC 89 09/06/2022   ALT 9 08/07/2022   AST 16 08/07/2022   NA 138 02/27/2022   K 4.6 02/27/2022   CL 105 02/27/2022   CREATININE 0.73 02/27/2022   BUN 23 02/27/2022   CO2 26 02/27/2022   TSH 1.99 08/07/2022   HGBA1C 5.9 02/27/2022    BP Readings from Last 3 Encounters:  03/01/23 114/70  01/10/23 118/74  09/06/22 104/80    Lab plan reviewed with patient   ASSESSMENT AND PLAN:  Discussed the following assessment and plan:    ICD-10-CM   1. Visit for preventive health examination  Z00.00     2. Medication management  Z79.899     3. Elevated LDL cholesterol level  E78.00     4. Hyperglycemia  R73.9     5. BMI 34.0-34.9,adult  Z68.34      No follow-ups on file.  Patient Care Team: Wilsie Kern, Neta Mends, MD as PCP - General (Internal Medicine) Dannielle Huh, MD (Orthopedic Surgery) Osborn Coho, MD (Inactive) (Otolaryngology) Dorisann Frames, MD as  Referring Physician (Endocrinology) There are no Patient Instructions on file for this visit.  Neta Mends. Dartha Rozzell M.D.

## 2023-09-18 ENCOUNTER — Ambulatory Visit (INDEPENDENT_AMBULATORY_CARE_PROVIDER_SITE_OTHER): Payer: BC Managed Care – PPO | Admitting: Internal Medicine

## 2023-09-18 ENCOUNTER — Encounter: Payer: Self-pay | Admitting: Internal Medicine

## 2023-09-18 VITALS — BP 118/88 | HR 65 | Temp 97.7°F | Ht 65.0 in | Wt 210.6 lb

## 2023-09-18 DIAGNOSIS — R739 Hyperglycemia, unspecified: Secondary | ICD-10-CM

## 2023-09-18 DIAGNOSIS — Z1159 Encounter for screening for other viral diseases: Secondary | ICD-10-CM | POA: Diagnosis not present

## 2023-09-18 DIAGNOSIS — Z79899 Other long term (current) drug therapy: Secondary | ICD-10-CM | POA: Diagnosis not present

## 2023-09-18 DIAGNOSIS — E7800 Pure hypercholesterolemia, unspecified: Secondary | ICD-10-CM

## 2023-09-18 DIAGNOSIS — Z6834 Body mass index (BMI) 34.0-34.9, adult: Secondary | ICD-10-CM

## 2023-09-18 DIAGNOSIS — Z0000 Encounter for general adult medical examination without abnormal findings: Secondary | ICD-10-CM

## 2023-09-18 LAB — CBC WITH DIFFERENTIAL/PLATELET
Basophils Absolute: 0 10*3/uL (ref 0.0–0.1)
Basophils Relative: 0.5 % (ref 0.0–3.0)
Eosinophils Absolute: 0.1 10*3/uL (ref 0.0–0.7)
Eosinophils Relative: 1.7 % (ref 0.0–5.0)
HCT: 42.8 % (ref 36.0–46.0)
Hemoglobin: 14.1 g/dL (ref 12.0–15.0)
Lymphocytes Relative: 23.6 % (ref 12.0–46.0)
Lymphs Abs: 1.8 10*3/uL (ref 0.7–4.0)
MCHC: 32.9 g/dL (ref 30.0–36.0)
MCV: 89.7 fL (ref 78.0–100.0)
Monocytes Absolute: 0.5 10*3/uL (ref 0.1–1.0)
Monocytes Relative: 6.1 % (ref 3.0–12.0)
Neutro Abs: 5.2 10*3/uL (ref 1.4–7.7)
Neutrophils Relative %: 68.1 % (ref 43.0–77.0)
Platelets: 248 10*3/uL (ref 150.0–400.0)
RBC: 4.78 Mil/uL (ref 3.87–5.11)
RDW: 13.1 % (ref 11.5–15.5)
WBC: 7.6 10*3/uL (ref 4.0–10.5)

## 2023-09-18 LAB — LIPID PANEL
Cholesterol: 189 mg/dL (ref 0–200)
HDL: 52.9 mg/dL (ref >39.00)
LDL Cholesterol: 112 mg/dL — ABNORMAL HIGH (ref 0–99)
NonHDL: 135.84
Total CHOL/HDL Ratio: 4
Triglycerides: 121 mg/dL (ref 0.0–149.0)
VLDL: 24.2 mg/dL (ref 0.0–40.0)

## 2023-09-18 LAB — COMPREHENSIVE METABOLIC PANEL
ALT: 12 U/L (ref 0–35)
AST: 18 U/L (ref 0–37)
Albumin: 4.4 g/dL (ref 3.5–5.2)
Alkaline Phosphatase: 145 U/L — ABNORMAL HIGH (ref 39–117)
BUN: 17 mg/dL (ref 6–23)
CO2: 26 meq/L (ref 19–32)
Calcium: 9.7 mg/dL (ref 8.4–10.5)
Chloride: 104 meq/L (ref 96–112)
Creatinine, Ser: 0.76 mg/dL (ref 0.40–1.20)
GFR: 88.67 mL/min (ref >60.00)
Glucose, Bld: 98 mg/dL (ref 70–99)
Potassium: 3.8 meq/L (ref 3.5–5.1)
Sodium: 139 meq/L (ref 135–145)
Total Bilirubin: 0.5 mg/dL (ref 0.2–1.2)
Total Protein: 7.1 g/dL (ref 6.0–8.3)

## 2023-09-18 LAB — TSH: TSH: 1.61 u[IU]/mL (ref 0.35–5.50)

## 2023-09-18 LAB — HEMOGLOBIN A1C: Hgb A1c MFr Bld: 6.2 % (ref 4.6–6.5)

## 2023-09-18 NOTE — Patient Instructions (Addendum)
Good to see you today . Updated labs . Continue lifestyle intervention healthy eating and exercise .  Plan fu  yearly or as planned

## 2023-09-20 LAB — HEPATITIS C ANTIBODY: Hepatitis C Ab: NONREACTIVE

## 2023-09-25 ENCOUNTER — Encounter: Payer: Self-pay | Admitting: Obstetrics and Gynecology

## 2023-09-25 DIAGNOSIS — Z1231 Encounter for screening mammogram for malignant neoplasm of breast: Secondary | ICD-10-CM | POA: Diagnosis not present

## 2023-09-25 LAB — HM MAMMOGRAPHY

## 2023-09-25 NOTE — Progress Notes (Signed)
Results stable  alk phos still elevated   hg A1c is 6.2  Share with dr Lindell Noe

## 2023-09-26 ENCOUNTER — Encounter: Payer: Self-pay | Admitting: Internal Medicine

## 2023-10-04 DIAGNOSIS — R7309 Other abnormal glucose: Secondary | ICD-10-CM | POA: Diagnosis not present

## 2023-10-11 DIAGNOSIS — R7309 Other abnormal glucose: Secondary | ICD-10-CM | POA: Diagnosis not present

## 2023-10-11 DIAGNOSIS — R635 Abnormal weight gain: Secondary | ICD-10-CM | POA: Diagnosis not present

## 2023-10-11 DIAGNOSIS — R140 Abdominal distension (gaseous): Secondary | ICD-10-CM | POA: Diagnosis not present

## 2023-10-12 LAB — HEMOGLOBIN A1C: A1c: 6.2

## 2023-10-12 LAB — COMPREHENSIVE METABOLIC PANEL (CC13): EGFR: 80

## 2023-10-17 DIAGNOSIS — R748 Abnormal levels of other serum enzymes: Secondary | ICD-10-CM | POA: Diagnosis not present

## 2023-10-17 DIAGNOSIS — R635 Abnormal weight gain: Secondary | ICD-10-CM | POA: Diagnosis not present

## 2023-10-17 DIAGNOSIS — R7309 Other abnormal glucose: Secondary | ICD-10-CM | POA: Diagnosis not present

## 2023-11-08 DIAGNOSIS — M19041 Primary osteoarthritis, right hand: Secondary | ICD-10-CM | POA: Diagnosis not present

## 2023-11-08 DIAGNOSIS — G5601 Carpal tunnel syndrome, right upper limb: Secondary | ICD-10-CM | POA: Diagnosis not present

## 2023-11-14 DIAGNOSIS — R7309 Other abnormal glucose: Secondary | ICD-10-CM | POA: Diagnosis not present

## 2024-01-10 DIAGNOSIS — R7309 Other abnormal glucose: Secondary | ICD-10-CM | POA: Diagnosis not present

## 2024-01-10 LAB — HM DEXA SCAN

## 2024-01-28 ENCOUNTER — Other Ambulatory Visit: Payer: Self-pay | Admitting: Obstetrics & Gynecology

## 2024-01-29 MED ORDER — ESTRADIOL 0.025 MG/24HR TD PTTW: 1.0000 | MEDICATED_PATCH | TRANSDERMAL | 0 refills | Status: DC

## 2024-01-29 NOTE — Telephone Encounter (Signed)
 Med refill request: Progesterone/HRT Patch Last AEX: 01/10/2023-ML Next AEX: 03/25/2024-TW Last MMG (if hormonal med): 08/2023-WNL Refill authorized: rx pend.

## 2024-02-19 DIAGNOSIS — D225 Melanocytic nevi of trunk: Secondary | ICD-10-CM | POA: Diagnosis not present

## 2024-02-19 DIAGNOSIS — L578 Other skin changes due to chronic exposure to nonionizing radiation: Secondary | ICD-10-CM | POA: Diagnosis not present

## 2024-02-19 DIAGNOSIS — Z86018 Personal history of other benign neoplasm: Secondary | ICD-10-CM | POA: Diagnosis not present

## 2024-02-19 DIAGNOSIS — L814 Other melanin hyperpigmentation: Secondary | ICD-10-CM | POA: Diagnosis not present

## 2024-02-21 DIAGNOSIS — R635 Abnormal weight gain: Secondary | ICD-10-CM | POA: Diagnosis not present

## 2024-02-21 DIAGNOSIS — R7309 Other abnormal glucose: Secondary | ICD-10-CM | POA: Diagnosis not present

## 2024-02-21 LAB — COMPREHENSIVE METABOLIC PANEL (CC13): EGFR: 70

## 2024-02-21 LAB — HEMOGLOBIN A1C: A1c: 5.9

## 2024-02-27 DIAGNOSIS — R748 Abnormal levels of other serum enzymes: Secondary | ICD-10-CM | POA: Diagnosis not present

## 2024-02-27 DIAGNOSIS — R635 Abnormal weight gain: Secondary | ICD-10-CM | POA: Diagnosis not present

## 2024-02-27 DIAGNOSIS — R7309 Other abnormal glucose: Secondary | ICD-10-CM | POA: Diagnosis not present

## 2024-03-05 DIAGNOSIS — R7309 Other abnormal glucose: Secondary | ICD-10-CM | POA: Diagnosis not present

## 2024-03-18 ENCOUNTER — Ambulatory Visit: Payer: BC Managed Care – PPO | Admitting: Internal Medicine

## 2024-03-18 ENCOUNTER — Encounter: Payer: Self-pay | Admitting: Internal Medicine

## 2024-03-18 VITALS — BP 120/86 | HR 69 | Temp 97.9°F | Ht 65.0 in | Wt 210.4 lb

## 2024-03-18 DIAGNOSIS — E7800 Pure hypercholesterolemia, unspecified: Secondary | ICD-10-CM | POA: Diagnosis not present

## 2024-03-18 DIAGNOSIS — R739 Hyperglycemia, unspecified: Secondary | ICD-10-CM | POA: Diagnosis not present

## 2024-03-18 DIAGNOSIS — Z6835 Body mass index (BMI) 35.0-35.9, adult: Secondary | ICD-10-CM

## 2024-03-18 DIAGNOSIS — E8881 Metabolic syndrome: Secondary | ICD-10-CM

## 2024-03-18 DIAGNOSIS — Z79899 Other long term (current) drug therapy: Secondary | ICD-10-CM | POA: Diagnosis not present

## 2024-03-18 MED ORDER — MUPIROCIN 2 % EX OINT: 1.0000 | TOPICAL_OINTMENT | Freq: Two times a day (BID) | CUTANEOUS | 0 refills | Status: AC

## 2024-03-18 NOTE — Progress Notes (Signed)
 Chief Complaint  Patient presents with   Medical Management of Chronic Issues    Pt is here for her follow up. Pt brought lab work from endocrinology and dexa scan reports.     HPI: Brandy Maynard 53 y.o. come in for Chronic disease management  discussion Using self pay CGM  Has seen  Dr.  Ronelle Coffee   using otc cgm  and A1c coming down  trying to not go on  glp 1  Exercising. 3-4  per week.  Feels better with this.  A1c come from 6.2- 5.9  but still insulin  resistance  Slightly elevated alk phos was felt to be from hepatic steatosis  Had dexa un interpreted on own  ROS: See pertinent positives and negatives per HPI.  Past Medical History:  Diagnosis Date   Allergy    Anemia    ANEMIA 12/15/2009   Qualifier: Diagnosis of  By: Ethel Henry MD, Joaquim Muir    Depression    Fatty liver    GERD (gastroesophageal reflux disease)    Heavy menses    on ocps   IBS (irritable bowel syndrome)    by hx and had a neg colonscopy   Migraines     Family History  Problem Relation Age of Onset   Arthritis Mother    Hypertension Mother    Cancer Mother        MELANOMA   Hyperlipidemia Mother    Non-Hodgkin's lymphoma Mother    Heart disease Father    Hypertension Maternal Grandmother    Dementia Maternal Grandmother    Hypertension Maternal Grandfather    Cancer Maternal Grandfather        MELANOMA   Heart disease Paternal Grandmother    Diabetes Paternal Grandfather    Heart disease Paternal Grandfather    Cancer - Ovarian Other        [employer] researcher, broadcasting/film/video    Social History   Socioeconomic History   Marital status: Married    Spouse name: Not on file   Number of children: Not on file   Years of education: Not on file   Highest education level: Not on file  Occupational History   Not on file  Tobacco Use   Smoking status: Never   Smokeless tobacco: Never  Vaping Use   Vaping status: Never Used  Substance and Sexual Activity   Alcohol use: Yes    Comment: 1-2 DRINKS A MONTH     Drug use: No   Sexual activity: Yes    Partners: Male    Birth control/protection: Surgical    Comment: 1st intercourse- 15, parnters- 3, tubal  Other Topics Concern   Not on file  Social History Narrative   Occupation: unemployed   Married   HH of 3-4    Pet dogs 2   G3P2         Social Drivers of Corporate investment banker Strain: Not on file  Food Insecurity: Not on file  Transportation Needs: Not on file  Physical Activity: Not on file  Stress: Not on file  Social Connections: Not on file    Outpatient Medications Prior to Visit  Medication Sig Dispense Refill   cetirizine (ZYRTEC) 10 MG tablet Take 10 mg by mouth daily.     Cholecalciferol 50 MCG (2000 UT) CAPS Vitamin d3  + k2     Continuous Glucose Sensor (FREESTYLE LIBRE 3 PLUS SENSOR) MISC USE AS DIRECTED. CHANGE EVERY 15 DAYS  estradiol  (VIVELLE -DOT) 0.025 MG/24HR Place 1 patch onto the skin 2 (two) times a week. 24 patch 0   fluticasone  (FLONASE ) 50 MCG/ACT nasal spray Place into both nostrils as needed for allergies or rhinitis.     metFORMIN  (GLUCOPHAGE ) 500 MG tablet 1,000 mg.     Probiotic Product (PROBIOTIC DAILY PO) Take by mouth.     progesterone  (PROMETRIUM ) 100 MG capsule TAKE 1 CAPSULE(100 MG) BY MOUTH AT BEDTIME 90 capsule 0   TURMERIC PO Take by mouth.     ALPRAZolam  (XANAX ) 0.5 MG tablet Take 1 tablet (0.5 mg total) by mouth 3 (three) times daily as needed for anxiety. (Patient not taking: Reported on 03/18/2024) 24 tablet 0   ASHWAGANDHA PO Take by mouth. (Patient not taking: Reported on 03/18/2024)     No facility-administered medications prior to visit.     EXAM:  BP 120/86 (BP Location: Right Arm, Patient Position: Sitting, Cuff Size: Large)   Pulse 69   Temp 97.9 F (36.6 C) (Oral)   Ht 5\' 5"  (1.651 m)   Wt 210 lb 6.4 oz (95.4 kg)   SpO2 98%   BMI 35.01 kg/m   Body mass index is 35.01 kg/m.  GENERAL: vitals reviewed and listed above, alert, oriented, appears well hydrated  and in no acute distress HEENT: atraumatic, conjunctiva  clear, no obvious abnormalities on inspection of external nose and ears NECK: no obvious masses on inspection palpation  CV: HRRR, no clubbing cyanosis or  peripheral edema nl cap refill  MS: moves all extremities without noticeable focal  abnormality PSYCH: pleasant and cooperative, no obvious depression or anxiety Lab Results  Component Value Date   WBC 7.6 09/18/2023   HGB 14.1 09/18/2023   HCT 42.8 09/18/2023   PLT 248.0 09/18/2023   GLUCOSE 98 09/18/2023   CHOL 189 09/18/2023   TRIG 121.0 09/18/2023   HDL 52.90 09/18/2023   LDLCALC 112 (H) 09/18/2023   ALT 12 09/18/2023   AST 18 09/18/2023   NA 139 09/18/2023   K 3.8 09/18/2023   CL 104 09/18/2023   CREATININE 0.76 09/18/2023   BUN 17 09/18/2023   CO2 26 09/18/2023   TSH 1.61 09/18/2023   HGBA1C 6.2 09/18/2023   BP Readings from Last 3 Encounters:  03/18/24 120/86  09/18/23 118/88  03/01/23 114/70   Record lab review and dexa sent to scan  dexa 50 %ile t 1 range ASSESSMENT AND PLAN:  Discussed the following assessment and plan:  Medication management  Dysmetabolic syndrome  Elevated LDL cholesterol level  Hyperglycemia  Adult BMI 35.0-35.9 kg/sq m Discussed reviewed labs   goals and options  and she will fu with dr Ronelle Coffee also Continue lifestyle intervention healthy eating and exercise . Dexa ok can repeat in 5+ years   -Patient advised to return or notify health care team  if  new concerns arise.  Patient Instructions  Bone density   is good . Continue lifestyle intervention healthy eating and exercise .   6 month cpe.  Full lab . Future orders. Lipids   if not done with Dr Ronelle Coffee.   Brandy Maynard M.D

## 2024-03-18 NOTE — Patient Instructions (Signed)
 Bone density   is good . Continue lifestyle intervention healthy eating and exercise .   6 month cpe.  Full lab . Future orders. Lipids   if not done with Dr Ronelle Coffee.

## 2024-03-19 ENCOUNTER — Other Ambulatory Visit: Payer: Self-pay | Admitting: Obstetrics & Gynecology

## 2024-03-19 DIAGNOSIS — N951 Menopausal and female climacteric states: Secondary | ICD-10-CM

## 2024-03-19 NOTE — Telephone Encounter (Signed)
 Med refill request: estradiol  0.025 mg patch Last AEX: 01/10/23 Dr. Lavoie Next AEX: 03/25/24 Tiffany NP Last MMG (if hormonal med) 08/2023 normal Refill authorized: RX pend. Please approve or deny as appropriate.

## 2024-03-25 ENCOUNTER — Ambulatory Visit (INDEPENDENT_AMBULATORY_CARE_PROVIDER_SITE_OTHER): Admitting: Nurse Practitioner

## 2024-03-25 ENCOUNTER — Encounter: Payer: Self-pay | Admitting: Nurse Practitioner

## 2024-03-25 VITALS — BP 112/82 | HR 73 | Ht 65.5 in | Wt 212.0 lb

## 2024-03-25 DIAGNOSIS — Z01419 Encounter for gynecological examination (general) (routine) without abnormal findings: Secondary | ICD-10-CM | POA: Diagnosis not present

## 2024-03-25 DIAGNOSIS — Z1331 Encounter for screening for depression: Secondary | ICD-10-CM | POA: Diagnosis not present

## 2024-03-25 DIAGNOSIS — Z79890 Hormone replacement therapy (postmenopausal): Secondary | ICD-10-CM

## 2024-03-25 MED ORDER — PROGESTERONE MICRONIZED 100 MG PO CAPS: 100.0000 mg | ORAL_CAPSULE | Freq: Every day | ORAL | 3 refills | Status: AC

## 2024-03-25 MED ORDER — ESTRADIOL 0.025 MG/24HR TD PTTW: 1.0000 | MEDICATED_PATCH | TRANSDERMAL | 3 refills | Status: AC

## 2024-03-25 NOTE — Progress Notes (Signed)
 Brandy Maynard 1971/06/11 [id number]   History:  53 y.o. Brandy Maynard presents for annual exam. Postmenopausal - on HRT. Had PMB last year while taking HRT, normal workup. Normal pap history. Sees endo for elevated alk phos (mother has history of lymphoma). Has been working on weight loss. Sees nutritionist.   Gynecologic History No LMP recorded. Patient is postmenopausal.   Contraception/Family planning: post menopausal status Sexually active: Yes  Health Maintenance Last Pap: 11/17/2021. Results were: Normal, 3-year repeat Last mammogram: 09/25/2023. Results were: Normal Last colonoscopy: 10/2016. Results were: Normal per patient Last Dexa: Not indicated     03/25/2024   11:59 AM  Depression screen PHQ 2/9  Decreased Interest 0  Down, Depressed, Hopeless 0  PHQ - 2 Score 0     Past medical history, past surgical history, family history and social history were all reviewed and documented in the EPIC chart. Married. 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 One daughter in NP for interventional radiology, other daughter owns pet sitting business.   ROS:  A ROS was performed and pertinent positives and negatives are included.  Exam:  Vitals:   03/25/24 1150  BP: 112/82  Pulse: 73  SpO2: 98%  Weight: 212 lb (96.2 kg)  Height: 5' 5.5" (1.664 m)   Body mass index is 34.74 kg/m.  General appearance:  Normal Thyroid :  Symmetrical, normal in size, without palpable masses or nodularity. Respiratory  Auscultation:  Clear without wheezing or rhonchi Cardiovascular  Auscultation:  Regular rate, without rubs, murmurs or gallops  Edema/varicosities:  Not grossly evident Abdominal  Soft,nontender, without masses, guarding or rebound.  Liver/spleen:  No organomegaly noted  Hernia:  None appreciated  Skin  Inspection:  Grossly normal Breasts: Examined lying and sitting.   Right: Without masses, retractions, nipple discharge or axillary adenopathy.   Left: Without masses, retractions,  nipple discharge or axillary adenopathy. Pelvic: External genitalia:  no lesions              Urethra:  normal appearing urethra with no masses, tenderness or lesions              Bartholins and Skenes: normal                 Vagina: normal appearing vagina with normal color and discharge, no lesions              Cervix: no lesions Bimanual Exam:  Uterus:  no masses or tenderness              Adnexa: no mass, fullness, tenderness              Rectovaginal: Deferred              Anus:  normal, no lesions  Patient informed chaperone available to be present for breast and pelvic exam. Patient has requested no chaperone to be present. Patient has been advised what will be completed during breast and pelvic exam.   Assessment/Plan:  53 y.o. Brandy Maynard for annual exam.   Well female exam with routine gynecological exam - Education provided on SBEs, importance of preventative screenings, current guidelines, high calcium diet, regular exercise, and multivitamin daily. Labs with PCP and endo.   Postmenopausal hormone therapy - Plan: estradiol  (VIVELLE -DOT) 0.025 MG/24HR twice weekly, progesterone  (PROMETRIUM ) 100 MG capsule nightly. Doing well on this and wants to continue.   Screening for cervical cancer - Normal Pap history.  Will repeat at 3-year interval per guidelines.  Screening for breast cancer -  Normal mammogram history.  Continue annual screenings.  Normal breast exam today.  Screening for colon cancer - 2018 colonoscopy. Will repeat at 10-year interval per GI's recommendation.   Return in about 1 year (around 03/25/2025) for Annual.     Andee Bamberger DNP, 12:27 PM 03/25/2024

## 2024-04-17 DIAGNOSIS — M7130 Other bursal cyst, unspecified site: Secondary | ICD-10-CM | POA: Diagnosis not present

## 2024-04-17 DIAGNOSIS — L739 Follicular disorder, unspecified: Secondary | ICD-10-CM | POA: Diagnosis not present

## 2024-06-12 DIAGNOSIS — R7309 Other abnormal glucose: Secondary | ICD-10-CM | POA: Diagnosis not present

## 2024-06-24 DIAGNOSIS — M67442 Ganglion, left hand: Secondary | ICD-10-CM | POA: Diagnosis not present

## 2024-06-24 DIAGNOSIS — M720 Palmar fascial fibromatosis [Dupuytren]: Secondary | ICD-10-CM | POA: Diagnosis not present

## 2024-06-24 DIAGNOSIS — M19042 Primary osteoarthritis, left hand: Secondary | ICD-10-CM | POA: Diagnosis not present

## 2024-06-26 ENCOUNTER — Other Ambulatory Visit: Payer: Self-pay | Admitting: Orthopedic Surgery

## 2024-07-02 DIAGNOSIS — M25572 Pain in left ankle and joints of left foot: Secondary | ICD-10-CM | POA: Diagnosis not present

## 2024-07-23 DIAGNOSIS — M84364A Stress fracture, left fibula, initial encounter for fracture: Secondary | ICD-10-CM | POA: Diagnosis not present

## 2024-07-24 ENCOUNTER — Encounter (HOSPITAL_BASED_OUTPATIENT_CLINIC_OR_DEPARTMENT_OTHER): Payer: Self-pay | Admitting: Orthopedic Surgery

## 2024-07-24 ENCOUNTER — Other Ambulatory Visit: Payer: Self-pay

## 2024-07-28 ENCOUNTER — Encounter (HOSPITAL_BASED_OUTPATIENT_CLINIC_OR_DEPARTMENT_OTHER)
Admission: RE | Admit: 2024-07-28 | Discharge: 2024-07-28 | Disposition: A | Discharge status: Home / Self Care | Source: Ambulatory Visit | Attending: Orthopedic Surgery | Admitting: Orthopedic Surgery

## 2024-07-28 DIAGNOSIS — M67442 Ganglion, left hand: Secondary | ICD-10-CM | POA: Diagnosis not present

## 2024-07-28 DIAGNOSIS — M19042 Primary osteoarthritis, left hand: Secondary | ICD-10-CM | POA: Diagnosis not present

## 2024-07-28 DIAGNOSIS — Z01818 Encounter for other preprocedural examination: Secondary | ICD-10-CM | POA: Insufficient documentation

## 2024-07-28 LAB — BASIC METABOLIC PANEL WITH GFR
Anion gap: 9 (ref 5–15)
BUN: 22 mg/dL — ABNORMAL HIGH (ref 6–20)
CO2: 22 mmol/L (ref 22–32)
Calcium: 9.4 mg/dL (ref 8.9–10.3)
Chloride: 107 mmol/L (ref 98–111)
Creatinine, Ser: 0.85 mg/dL (ref 0.44–1.00)
GFR, Estimated: >60 mL/min (ref >60)
Glucose, Bld: 101 mg/dL — ABNORMAL HIGH (ref 70–99)
Potassium: 4.6 mmol/L (ref 3.5–5.1)
Sodium: 138 mmol/L (ref 135–145)

## 2024-07-28 NOTE — Progress Notes (Signed)

## 2024-07-31 ENCOUNTER — Ambulatory Visit (HOSPITAL_BASED_OUTPATIENT_CLINIC_OR_DEPARTMENT_OTHER): Admitting: Anesthesiology

## 2024-07-31 ENCOUNTER — Ambulatory Visit (HOSPITAL_BASED_OUTPATIENT_CLINIC_OR_DEPARTMENT_OTHER)
Admission: RE | Admit: 2024-07-31 | Discharge: 2024-07-31 | Disposition: A | Discharge status: Home / Self Care | Attending: Orthopedic Surgery | Admitting: Orthopedic Surgery

## 2024-07-31 ENCOUNTER — Encounter (HOSPITAL_BASED_OUTPATIENT_CLINIC_OR_DEPARTMENT_OTHER)
Admission: RE | Disposition: A | Discharge status: Home / Self Care | Payer: Self-pay | Source: Home / Self Care | Attending: Orthopedic Surgery

## 2024-07-31 ENCOUNTER — Other Ambulatory Visit: Payer: Self-pay

## 2024-07-31 ENCOUNTER — Encounter (HOSPITAL_BASED_OUTPATIENT_CLINIC_OR_DEPARTMENT_OTHER): Payer: Self-pay | Admitting: Orthopedic Surgery

## 2024-07-31 DIAGNOSIS — M67442 Ganglion, left hand: Secondary | ICD-10-CM | POA: Insufficient documentation

## 2024-07-31 DIAGNOSIS — R7303 Prediabetes: Secondary | ICD-10-CM

## 2024-07-31 DIAGNOSIS — M19042 Primary osteoarthritis, left hand: Secondary | ICD-10-CM | POA: Insufficient documentation

## 2024-07-31 HISTORY — PX: CYST REMOVAL HAND: SHX6279

## 2024-07-31 HISTORY — DX: Prediabetes: R73.03

## 2024-07-31 HISTORY — PX: ARTHROTOMY: SHX5728

## 2024-07-31 LAB — GLUCOSE, CAPILLARY: Glucose-Capillary: 113 mg/dL — ABNORMAL HIGH (ref 70–99)

## 2024-07-31 SURGERY — REMOVAL, CYST, HAND
Anesthesia: General | Site: Middle Finger | Laterality: Left

## 2024-07-31 MED ORDER — MEPERIDINE HCL 25 MG/ML IJ SOLN: 6.2500 mg | INTRAMUSCULAR | Status: DC | PRN

## 2024-07-31 MED ORDER — OXYCODONE HCL 5 MG PO TABS: 5.0000 mg | ORAL_TABLET | Freq: Once | ORAL | Status: DC | PRN

## 2024-07-31 MED ORDER — ONDANSETRON HCL 4 MG/2ML IJ SOLN
INTRAMUSCULAR | Status: AC
  Filled 2024-07-31: qty 2

## 2024-07-31 MED ORDER — IBUPROFEN 600 MG PO TABS: 600.0000 mg | ORAL_TABLET | Freq: Four times a day (QID) | ORAL | 0 refills | Status: AC | PRN

## 2024-07-31 MED ORDER — FENTANYL CITRATE (PF) 100 MCG/2ML IJ SOLN
INTRAMUSCULAR | Status: AC
  Filled 2024-07-31: qty 2

## 2024-07-31 MED ORDER — EPHEDRINE 5 MG/ML INJ
INTRAVENOUS | Status: AC
  Filled 2024-07-31: qty 5

## 2024-07-31 MED ORDER — LIDOCAINE 2% (20 MG/ML) 5 ML SYRINGE
INTRAMUSCULAR | Status: AC
  Filled 2024-07-31: qty 5

## 2024-07-31 MED ORDER — AMISULPRIDE (ANTIEMETIC) 5 MG/2ML IV SOLN: 10.0000 mg | Freq: Once | INTRAVENOUS | Status: DC | PRN

## 2024-07-31 MED ORDER — HYDROMORPHONE HCL 1 MG/ML IJ SOLN: 0.2500 mg | INTRAMUSCULAR | Status: DC | PRN

## 2024-07-31 MED ORDER — BUPIVACAINE HCL (PF) 0.25 % IJ SOLN
INTRAMUSCULAR | Status: DC | PRN
  Administered 2024-07-31: 9 mL

## 2024-07-31 MED ORDER — DEXAMETHASONE SODIUM PHOSPHATE 10 MG/ML IJ SOLN
INTRAMUSCULAR | Status: AC
  Filled 2024-07-31: qty 1

## 2024-07-31 MED ORDER — PHENYLEPHRINE HCL (PRESSORS) 10 MG/ML IV SOLN
INTRAVENOUS | Status: DC | PRN
  Administered 2024-07-31: 80 ug via INTRAVENOUS

## 2024-07-31 MED ORDER — SODIUM CHLORIDE 0.9 % IV SOLN: 12.5000 mg | INTRAVENOUS | Status: DC | PRN

## 2024-07-31 MED ORDER — MIDAZOLAM HCL 5 MG/5ML IJ SOLN
INTRAMUSCULAR | Status: DC | PRN
  Administered 2024-07-31: 2 mg via INTRAVENOUS

## 2024-07-31 MED ORDER — LIDOCAINE HCL (CARDIAC) PF 100 MG/5ML IV SOSY
PREFILLED_SYRINGE | INTRAVENOUS | Status: DC | PRN
  Administered 2024-07-31: 60 mg via INTRAVENOUS

## 2024-07-31 MED ORDER — TRAMADOL HCL 50 MG PO TABS: 50.0000 mg | ORAL_TABLET | Freq: Four times a day (QID) | ORAL | 0 refills | Status: AC | PRN

## 2024-07-31 MED ORDER — ONDANSETRON HCL 4 MG/2ML IJ SOLN
INTRAMUSCULAR | Status: DC | PRN
  Administered 2024-07-31: 4 mg via INTRAVENOUS

## 2024-07-31 MED ORDER — LACTATED RINGERS IV SOLN
INTRAVENOUS | Status: DC
Start: 2024-07-31 — End: 2024-07-31

## 2024-07-31 MED ORDER — CEFAZOLIN SODIUM-DEXTROSE 2-3 GM-%(50ML) IV SOLR
INTRAVENOUS | Status: DC | PRN
  Administered 2024-07-31: 2 g via INTRAVENOUS

## 2024-07-31 MED ORDER — FENTANYL CITRATE (PF) 100 MCG/2ML IJ SOLN
INTRAMUSCULAR | Status: DC | PRN
  Administered 2024-07-31: 100 ug via INTRAVENOUS

## 2024-07-31 MED ORDER — 0.9 % SODIUM CHLORIDE (POUR BTL) OPTIME
TOPICAL | Status: DC | PRN
  Administered 2024-07-31: 100 mL

## 2024-07-31 MED ORDER — OXYCODONE HCL 5 MG/5ML PO SOLN: 5.0000 mg | Freq: Once | ORAL | Status: DC | PRN

## 2024-07-31 MED ORDER — PROPOFOL 10 MG/ML IV BOLUS
INTRAVENOUS | Status: DC | PRN
  Administered 2024-07-31: 200 mg via INTRAVENOUS

## 2024-07-31 MED ORDER — DEXAMETHASONE SODIUM PHOSPHATE 10 MG/ML IJ SOLN
INTRAMUSCULAR | Status: DC | PRN
  Administered 2024-07-31: 5 mg via INTRAVENOUS

## 2024-07-31 MED ORDER — MIDAZOLAM HCL 2 MG/2ML IJ SOLN
INTRAMUSCULAR | Status: AC
  Filled 2024-07-31: qty 2

## 2024-07-31 MED ORDER — PHENYLEPHRINE 80 MCG/ML (10ML) SYRINGE FOR IV PUSH (FOR BLOOD PRESSURE SUPPORT)
PREFILLED_SYRINGE | INTRAVENOUS | Status: AC
  Filled 2024-07-31: qty 10

## 2024-07-31 SURGICAL SUPPLY — 40 items
BANDAGE GAUZE 1X75IN STRL (MISCELLANEOUS) IMPLANT
BENZOIN TINCTURE PRP APPL 2/3 (GAUZE/BANDAGES/DRESSINGS) IMPLANT
BLADE MINI RND TIP GREEN BEAV (BLADE) IMPLANT
BLADE SURG 15 STRL LF DISP TIS (BLADE) ×2 IMPLANT
BNDG COHESIVE 1X5 TAN STRL LF (GAUZE/BANDAGES/DRESSINGS) IMPLANT
BNDG COHESIVE 2X5 TAN ST LF (GAUZE/BANDAGES/DRESSINGS) IMPLANT
BNDG COMPR ESMARK 4X3 LF (GAUZE/BANDAGES/DRESSINGS) IMPLANT
BNDG ELASTIC 2INX 5YD STR LF (GAUZE/BANDAGES/DRESSINGS) IMPLANT
BNDG ELASTIC 3INX 5YD STR LF (GAUZE/BANDAGES/DRESSINGS) IMPLANT
BNDG GAUZE DERMACEA FLUFF 4 (GAUZE/BANDAGES/DRESSINGS) IMPLANT
BNDG PLASTER X FAST 3X3 WHT LF (CAST SUPPLIES) IMPLANT
CHLORAPREP W/TINT 26 (MISCELLANEOUS) ×1 IMPLANT
CORD BIPOLAR FORCEPS 12FT (ELECTRODE) ×1 IMPLANT
COVER BACK TABLE 60X90IN (DRAPES) ×1 IMPLANT
COVER MAYO STAND STRL (DRAPES) ×1 IMPLANT
CUFF TOURN SGL QUICK 18X4 (TOURNIQUET CUFF) ×1 IMPLANT
DRAPE EXTREMITY T 121X128X90 (DISPOSABLE) ×1 IMPLANT
DRAPE SURG 17X23 STRL (DRAPES) ×1 IMPLANT
GAUZE SPONGE 4X4 12PLY STRL (GAUZE/BANDAGES/DRESSINGS) ×1 IMPLANT
GAUZE STRETCH 2X75IN STRL (MISCELLANEOUS) IMPLANT
GAUZE XEROFORM 1X8 LF (GAUZE/BANDAGES/DRESSINGS) ×1 IMPLANT
GLOVE BIO SURGEON STRL SZ7.5 (GLOVE) ×1 IMPLANT
GLOVE BIOGEL PI IND STRL 8 (GLOVE) ×1 IMPLANT
GOWN STRL REUS W/ TWL LRG LVL3 (GOWN DISPOSABLE) ×1 IMPLANT
NDL HYPO 25X1 1.5 SAFETY (NEEDLE) ×1 IMPLANT
NEEDLE HYPO 25X1 1.5 SAFETY (NEEDLE) ×1 IMPLANT
NS IRRIG 1000ML POUR BTL (IV SOLUTION) ×1 IMPLANT
PACK BASIN DAY SURGERY FS (CUSTOM PROCEDURE TRAY) ×1 IMPLANT
PAD CAST 3X4 CTTN HI CHSV (CAST SUPPLIES) IMPLANT
PAD CAST 4YDX4 CTTN HI CHSV (CAST SUPPLIES) IMPLANT
PADDING CAST ABS COTTON 4X4 ST (CAST SUPPLIES) ×1 IMPLANT
SPLINT FINGER 5/8X2.25 (CAST SUPPLIES) IMPLANT
STOCKINETTE 4X48 STRL (DRAPES) ×1 IMPLANT
STRIP CLOSURE SKIN 1/2X4 (GAUZE/BANDAGES/DRESSINGS) IMPLANT
SUT ETHILON 3 0 PS 1 (SUTURE) IMPLANT
SUT ETHILON 4 0 PS 2 18 (SUTURE) ×1 IMPLANT
SYR BULB EAR ULCER 3OZ GRN STR (SYRINGE) ×1 IMPLANT
SYR CONTROL 10ML LL (SYRINGE) ×1 IMPLANT
TOWEL GREEN STERILE FF (TOWEL DISPOSABLE) ×2 IMPLANT
UNDERPAD 30X36 HEAVY ABSORB (UNDERPADS AND DIAPERS) ×1 IMPLANT

## 2024-07-31 NOTE — Anesthesia Preprocedure Evaluation (Addendum)
 Anesthesia Evaluation  Patient identified by MRN, date of birth, ID band Patient awake    Reviewed: Allergy & Precautions, H&P , NPO status , Patient's Chart, lab work & pertinent test results  Airway Mallampati: II  TM Distance: >3 FB Neck ROM: Full    Dental  (+) Dental Advisory Given   Pulmonary neg pulmonary ROS   Pulmonary exam normal breath sounds clear to auscultation       Cardiovascular negative cardio ROS Normal cardiovascular exam Rhythm:Regular Rate:Normal     Neuro/Psych  Headaches  Anxiety Depression     negative psych ROS   GI/Hepatic Neg liver ROS,GERD  ,,  Endo/Other  negative endocrine ROS    Renal/GU negative Renal ROS  negative genitourinary   Musculoskeletal negative musculoskeletal ROS (+)    Abdominal  (+) + obese  Peds negative pediatric ROS (+)  Hematology negative hematology ROS (+)   Anesthesia Other Findings   Reproductive/Obstetrics negative OB ROS                              Anesthesia Physical Anesthesia Plan  ASA: 2  Anesthesia Plan: General   Post-op Pain Management:    Induction: Intravenous  PONV Risk Score and Plan: 3 and Ondansetron, Dexamethasone , Midazolam and Treatment may vary due to age or medical condition  Airway Management Planned: LMA  Additional Equipment:   Intra-op Plan:   Post-operative Plan: Extubation in OR  Informed Consent: I have reviewed the patients History and Physical, chart, labs and discussed the procedure including the risks, benefits and alternatives for the proposed anesthesia with the patient or authorized representative who has indicated his/her understanding and acceptance.     Dental advisory given  Plan Discussed with: CRNA  Anesthesia Plan Comments:         Anesthesia Quick Evaluation

## 2024-07-31 NOTE — Anesthesia Postprocedure Evaluation (Signed)
 Anesthesia Post Note  Patient: Brandy Maynard  Procedure(s) Performed: REMOVAL, CYST, HAND (Left: Middle Finger) ARTHROTOMY (Left: Middle Finger)     Patient location during evaluation: PACU Anesthesia Type: General Level of consciousness: awake and alert Pain management: pain level controlled Vital Signs Assessment: post-procedure vital signs reviewed and stable Respiratory status: spontaneous breathing, nonlabored ventilation and respiratory function stable Cardiovascular status: blood pressure returned to baseline and stable Postop Assessment: no apparent nausea or vomiting Anesthetic complications: no   No notable events documented.  Last Vitals:  Vitals:   07/31/24 1220 07/31/24 1300  BP:  126/82  Pulse: (!) 57 62  Resp: 11 16  Temp:  36.6 C  SpO2: 94% 100%    Last Pain:  Vitals:   07/31/24 1300  TempSrc: Temporal  PainSc: 0-No pain                 Butler Levander Pinal

## 2024-07-31 NOTE — Discharge Instructions (Addendum)

## 2024-07-31 NOTE — H&P (Signed)
 Brandy Maynard is an 53 y.o. female.   Chief Complaint: mucoid cyst HPI: 53 yo female with left long finger mucoid cyst.  This is bothersome to her.  She wishes to have it removed and the dip joint debrided to try to prevent recurrence.  Allergies:  Allergies  Allergen Reactions   Junel Fe 1-20 [Norethin  Ace-Eth Estrad-Fe] Other (See Comments)     migraine  With visual aura ;elevated blood pressures   Penicillins     Hives. Eyes swell shut.    Vicodin [Hydrocodone -Acetaminophen ] Itching    Past Medical History:  Diagnosis Date   Allergy    ANEMIA 12/15/2009   Qualifier: Diagnosis of  By: Charlett MD, Apolinar POUR    Depression    Fatty liver    GERD (gastroesophageal reflux disease)    Heavy menses    on ocps   IBS (irritable bowel syndrome)    by hx and had a neg colonscopy   Migraines    Pre-diabetes     Past Surgical History:  Procedure Laterality Date   CHOLECYSTECTOMY     COLONOSCOPY  08/03/2005   COLONOSCOPY W/ BIOPSIES  Nov 12 2015   ESOPHAGOGASTRODUODENOSCOPY ENDOSCOPY  Nov 12 2015   FOOT SURGERY     KNEE ARTHROSCOPY  2012   right knee,,, left knee at 15   LIGAMENT REPAIR  2002   Rt ankle   PELVIC LAPAROSCOPY  10/11/2005   LAPAROTOMY, LYSIS OF PELVIC ADHESIONS, RSO AND CAUTERIZATION AND TRANSECTION OF LEFT FALLOPIAN TUBE   PLICA, arthritis and cartilage repair  01/11/2011   on Rt knee   TUBAL LIGATION  09/2005   scar tissue removal ovarian cyst    Family History: Family History  Problem Relation Age of Onset   Arthritis Mother    Hypertension Mother    Cancer Mother        MELANOMA   Hyperlipidemia Mother    Non-Hodgkin's lymphoma Mother    Heart disease Father    Hypertension Maternal Grandmother    Dementia Maternal Grandmother    Hypertension Maternal Grandfather    Cancer Maternal Grandfather        MELANOMA   Heart disease Paternal Grandmother    Diabetes Paternal Grandfather    Heart disease Paternal Grandfather    Cancer - Ovarian Other         Mat Great Grandmother    Social History:   reports that she has never smoked. She has never used smokeless tobacco. She reports current alcohol use. She reports that she does not use drugs.  Medications: Medications Prior to Admission  Medication Sig Dispense Refill   cetirizine (ZYRTEC) 10 MG tablet Take 10 mg by mouth daily.     Cholecalciferol 50 MCG (2000 UT) CAPS Vitamin d3  + k2     Continuous Glucose Sensor (FREESTYLE LIBRE 3 PLUS SENSOR) MISC USE AS DIRECTED. CHANGE EVERY 15 DAYS     estradiol  (VIVELLE -DOT) 0.025 MG/24HR Place 1 patch onto the skin 2 (two) times a week. 24 patch 3   fluticasone  (FLONASE ) 50 MCG/ACT nasal spray Place into both nostrils as needed for allergies or rhinitis.     Probiotic Product (PROBIOTIC DAILY PO) Take by mouth.     progesterone  (PROMETRIUM ) 100 MG capsule Take 1 capsule (100 mg total) by mouth daily. 90 capsule 3   TURMERIC PO Take by mouth.     ibuprofen  (ADVIL ) 800 MG tablet 1 tablet Orally as needed for pain (Patient not taking: Reported on  03/25/2024)     meloxicam (MOBIC) 15 MG tablet Take 15 mg by mouth daily as needed. (Patient not taking: Reported on 03/25/2024)     metFORMIN  (GLUCOPHAGE ) 500 MG tablet 1,000 mg. (Patient not taking: Reported on 07/24/2024)     mupirocin  ointment (BACTROBAN ) 2 % Apply 1 Application topically 2 (two) times daily. 15 g 0    No results found for this or any previous visit (from the past 48 hours).  No results found.    Blood pressure 138/86, pulse 72, temperature 98.9 F (37.2 C), temperature source Temporal, resp. rate 15, height 5' 5.5 (1.664 m), weight 98.9 kg, last menstrual period 10/12/2022, SpO2 100%.  General appearance: alert, cooperative, and appears stated age Head: Normocephalic, without obvious abnormality, atraumatic Neck: supple, symmetrical, trachea midline Extremities: Intact sensation and capillary refill all digits.  +epl/fpl/io.  No wounds.  Skin: Skin color, texture, turgor  normal. No rashes or lesions Neurologic: Grossly normal Incision/Wound: none  Assessment/Plan Left long finger mucoid cyst and dip joint arthritis.  Non operative and operative treatment options have been discussed with the patient and patient wishes to proceed with operative treatment. Risks, benefits, and alternatives of surgery have been discussed and the patient agrees with the plan of care.   Brandy Maynard 07/31/2024, 9:43 AM

## 2024-07-31 NOTE — Anesthesia Procedure Notes (Signed)
 Procedure Name: LMA Insertion Date/Time: 07/31/2024 11:12 AM  Performed by: Emilio Rock BIRCH, CRNAPre-anesthesia Checklist: Patient identified, Emergency Drugs available, Suction available and Patient being monitored Patient Re-evaluated:Patient Re-evaluated prior to induction Oxygen Delivery Method: Circle System Utilized Preoxygenation: Pre-oxygenation with 100% oxygen Induction Type: IV induction Ventilation: Mask ventilation without difficulty LMA: LMA inserted LMA Size: 4.0 Number of attempts: 1 Airway Equipment and Method: bite block Placement Confirmation: positive ETCO2 Tube secured with: Tape Dental Injury: Teeth and Oropharynx as per pre-operative assessment

## 2024-07-31 NOTE — Transfer of Care (Signed)
 Immediate Anesthesia Transfer of Care Note  Patient: Brandy Maynard  Procedure(s) Performed: REMOVAL, CYST, HAND (Left: Middle Finger) ARTHROTOMY (Left: Middle Finger)  Patient Location: PACU  Anesthesia Type:General  Level of Consciousness: awake, alert , oriented, drowsy, and patient cooperative  Airway & Oxygen Therapy: Patient Spontanous Breathing and Patient connected to face mask oxygen  Post-op Assessment: Report given to RN and Post -op Vital signs reviewed and stable  Post vital signs: Reviewed and stable  Last Vitals:  Vitals Value Taken Time  BP    Temp    Pulse 76 07/31/24 11:52  Resp    SpO2 94 % 07/31/24 11:52  Vitals shown include unfiled device data.  Last Pain:  Vitals:   07/31/24 0922  TempSrc: Temporal  PainSc: 0-No pain         Complications: No notable events documented.

## 2024-07-31 NOTE — Op Note (Signed)
 NAME: Brandy Maynard MEDICAL RECORD NO: [id number] DATE OF BIRTH: 20-Feb-1971 FACILITY: [facility] LOCATION[facility] PHYSICIAN: Brandy Biernat R. Anahid Eskelson, MD   OPERATIVE REPORT   DATE OF PROCEDURE: 07/31/24    PREOPERATIVE DIAGNOSIS: Left long finger mucoid cyst and DIP joint arthritis   POSTOPERATIVE DIAGNOSIS: Left long finger mucoid cyst and DIP joint arthritis   PROCEDURE: 1.  Left long finger excision mucoid cyst 2.  Left long finger debridement of DIP joint including osteophyte from middle phalanx   SURGEON:  Brandy Maynard, M.D.   ASSISTANT: none   ANESTHESIA:  General   INTRAVENOUS FLUIDS:  Per anesthesia flow sheet.   ESTIMATED BLOOD LOSS:  Minimal.   COMPLICATIONS:  None.   SPECIMENS: Left long finger mucoid cyst to pathology   TOURNIQUET TIME:    Total Tourniquet Time Documented: Forearm (Left) - 19 minutes Total: Forearm (Left) - 19 minutes    DISPOSITION:  Stable to PACU.   INDICATIONS: 53 year old female with left long finger mucoid cyst.  This is bothersome to her.  She wishes to have it removed and the DIP joint debrided to try to prevent recurrence.  Risks, benefits and alternatives of surgery were discussed including the risks of blood loss, infection, damage to nerves, vessels, tendons, ligaments, bone for surgery, need for additional surgery, complications with wound healing, continued pain, stiffness, , recurrence.  She voiced understanding of these risks and elected to proceed.  OPERATIVE COURSE:  After being identified preoperatively by myself,  the patient and I agreed on the procedure and site of the procedure.  The surgical site was marked.  Surgical consent had been signed. Preoperative IV antibiotic prophylaxis was given. She was transferred to the operating room and placed on the operating table in supine position with the left upper extremity on an arm board.  General anesthesia was induced by the anesthesiologist.  Left upper extremity was  prepped and draped in normal sterile orthopedic fashion.  A surgical pause was performed between the surgeons, anesthesia, and operating room staff and all were in agreement as to the patient, procedure, and site of procedure.  Tourniquet at the proximal aspect of the forearm was inflated to 250 mmHg after exsanguination of the arm with an Esmarch bandage.  A hockey-stick shaped incision was made at the dorsum of the DIP joint of the long finger.  This was carried into subcutaneous tissues by spreading technique.  The cyst was identified.  Was freed of soft tissue attachments removed with a synovectomy rongeurs.  It was sent to pathology for examination.  It was coming from the joint radial to the extensor tendon.  The joint was entered.  The synovectomy rongeurs were used to perform synovectomy of the joint.  Prominent bone at the dorsum of the middle phalanx was removed.  There was an osteophyte within the extensor tendon which was not able to be removed.  The wound and joint were copiously irrigated with sterile saline.  Wound was closed with 4-0 nylon in a horizontal mattress fashion.  Digital block was performed with quarter percent plain Marcaine to aid in postoperative analgesia.  The wound was dressed with sterile Xeroform and 4 x 4 and wrapped with a Coban dressing lightly.  AlumaFoam splint was placed and wrapped lightly with Coban dressing.  The tourniquet was deflated at 18 minutes.  Fingertips were pink with brisk capillary refill after deflation of tourniquet.  The operative  drapes were broken down.  The patient was awoken from anesthesia  safely.  She was transferred back to the stretcher and taken to PACU in stable condition.  I will see her back in the office in 1 week for postoperative followup.  I will give her a prescription for Tramadol 50 mg 1 tab PO q6 hours prn pain, dispense #15 and ibuprofen  600 mg p.o. every 6 hours as needed pain.   Brandy Mikkelsen, MD Electronically signed, 07/31/24

## 2024-08-01 ENCOUNTER — Encounter (HOSPITAL_BASED_OUTPATIENT_CLINIC_OR_DEPARTMENT_OTHER): Payer: Self-pay | Admitting: Orthopedic Surgery

## 2024-08-01 LAB — SURGICAL PATHOLOGY

## 2024-08-08 DIAGNOSIS — M6740 Ganglion, unspecified site: Secondary | ICD-10-CM | POA: Diagnosis not present

## 2024-08-28 DIAGNOSIS — R7309 Other abnormal glucose: Secondary | ICD-10-CM | POA: Diagnosis not present

## 2024-08-28 DIAGNOSIS — R635 Abnormal weight gain: Secondary | ICD-10-CM | POA: Diagnosis not present

## 2024-08-28 LAB — BASIC METABOLIC PANEL WITH GFR
BUN: 18 (ref 4–21)
CO2: 21 (ref 13–22)
Chloride: 101 (ref 99–108)
Creatinine: 0.8 (ref 0.5–1.1)
Glucose: 108
Potassium: 4.5 meq/L (ref 3.5–5.1)
Sodium: 138 (ref 137–147)

## 2024-08-28 LAB — HEPATIC FUNCTION PANEL
ALT: 12 U/L (ref 7–35)
AST: 16 (ref 13–35)
Alkaline Phosphatase: 146 — AB (ref 25–125)

## 2024-08-28 LAB — HEMOGLOBIN A1C: Hemoglobin A1C: 6

## 2024-08-29 LAB — COMPREHENSIVE METABOLIC PANEL WITH GFR
Albumin: 4.3 (ref 3.5–5.0)
Calcium: 9.8 (ref 8.7–10.7)
Globulin: 2.5
eGFR: 84

## 2024-08-29 LAB — TSH: TSH: 4.22 (ref 0.41–5.90)

## 2024-08-29 LAB — LIPID PANEL
Cholesterol: 183 (ref 0–200)
HDL: 50 (ref 35–70)
LDL Cholesterol: 110
Triglycerides: 129 (ref 40–160)

## 2024-09-04 DIAGNOSIS — R748 Abnormal levels of other serum enzymes: Secondary | ICD-10-CM | POA: Diagnosis not present

## 2024-09-04 DIAGNOSIS — R7309 Other abnormal glucose: Secondary | ICD-10-CM | POA: Diagnosis not present

## 2024-09-04 DIAGNOSIS — E039 Hypothyroidism, unspecified: Secondary | ICD-10-CM | POA: Diagnosis not present

## 2024-09-04 DIAGNOSIS — R635 Abnormal weight gain: Secondary | ICD-10-CM | POA: Diagnosis not present

## 2024-09-10 DIAGNOSIS — M67442 Ganglion, left hand: Secondary | ICD-10-CM | POA: Diagnosis not present

## 2024-09-10 DIAGNOSIS — M19042 Primary osteoarthritis, left hand: Secondary | ICD-10-CM | POA: Diagnosis not present

## 2024-09-10 DIAGNOSIS — M6740 Ganglion, unspecified site: Secondary | ICD-10-CM | POA: Diagnosis not present

## 2024-09-24 ENCOUNTER — Encounter: Payer: Self-pay | Admitting: Internal Medicine

## 2024-09-24 ENCOUNTER — Ambulatory Visit (INDEPENDENT_AMBULATORY_CARE_PROVIDER_SITE_OTHER): Admitting: Internal Medicine

## 2024-09-24 VITALS — BP 114/74 | HR 68 | Temp 97.9°F | Ht 65.0 in | Wt 216.4 lb

## 2024-09-24 DIAGNOSIS — Z0000 Encounter for general adult medical examination without abnormal findings: Secondary | ICD-10-CM | POA: Diagnosis not present

## 2024-09-24 DIAGNOSIS — E8881 Metabolic syndrome: Secondary | ICD-10-CM | POA: Diagnosis not present

## 2024-09-24 NOTE — Progress Notes (Signed)
 Chief Complaint  Patient presents with   Annual Exam    Pt reports she already done blood work with [facility] on 07/2024    HPI: Patient  Brandy Maynard  53 y.o. comes in today for Preventive Health Care visit  Interim hx  Ortho hand finger cyst  returned . Hx of stress fracture .  GYNE check 5 25  Endocrine Balan pre diabetes  alk phos  lab reports .  Q 4-6 months   Made appt with weight management  is reluctant to do a glp1   Health Maintenance  Topic Date Due   COVID-19 Vaccine (3 - Pfizer risk series) 10/10/2024 (Originally 08/22/2020)   Zoster Vaccines- Shingrix (1 of 2) 12/25/2024 (Originally 01/13/1988)   Influenza Vaccine  01/27/2025 (Originally 05/30/2024)   Cervical Cancer Screening (Pap smear)  03/24/2025 (Originally 11/17/2022)   Mammogram  03/24/2025 (Originally 09/24/2024)   Pneumococcal Vaccine: 50+ Years (1 of 1 - PCV) 09/24/2025 (Originally 01/13/2019)   Hepatitis B Vaccines 19-59 Average Risk (1 of 3 - 19+ 3-dose series) 09/24/2025 (Originally 01/13/1988)   HIV Screening  09/24/2025 (Originally 01/13/1984)   Colonoscopy  11/11/2025   DTaP/Tdap/Td (3 - Td or Tdap) 09/06/2032   Hepatitis C Screening  Completed   HPV VACCINES  Aged Out   Meningococcal B Vaccine  Aged Out   Health Maintenance Review LIFESTYLE:  Exercise:  normally 3-4 d per week   but off recently  stress   redoing ktchen  Tobacco/ETS: n Alcohol:   ocass Sugar beverages: Sleep: 4  from stress prev 6  Drug use: no HH of  3  1 pet  dog      ROS:  GEN/ HEENT: No fever, significant weight changes sweats headaches vision problems hearing changes, CV/ PULM; No chest pain shortness of breath cough, syncope,edema  change in exercise tolerance. GI /GU: No adominal pain, vomiting, change in bowel habits. No blood in the stool. No significant GU symptoms. SKIN/HEME: ,no acute skin rashes suspicious lesions or bleeding. No lymphadenopathy, nodules, masses.  NEURO/ PSYCH:  No neurologic signs such as weakness  numbness. No depression anxiety. IMM/ Allergy: No unusual infections.  Allergy .   REST of 12 system review negative except as per HPI   Past Medical History:  Diagnosis Date   Allergy    ANEMIA 12/15/2009   Qualifier: Diagnosis of  By: Charlett MD, Apolinar POUR    Depression    Fatty liver    GERD (gastroesophageal reflux disease)    Heavy menses    on ocps   IBS (irritable bowel syndrome)    by hx and had a neg colonscopy   Migraines    Pre-diabetes     Past Surgical History:  Procedure Laterality Date   ARTHROTOMY Left 07/31/2024   Procedure: ARTHROTOMY;  Surgeon: Murrell Drivers, MD;  Location: [facility];  Service: Orthopedics;  Laterality: Left;   CHOLECYSTECTOMY     COLONOSCOPY  08/03/2005   COLONOSCOPY W/ BIOPSIES  Nov 12 2015   CYST REMOVAL HAND Left 07/31/2024   Procedure: REMOVAL, CYST, HAND;  Surgeon: Murrell Drivers, MD;  Location: [facility];  Service: Orthopedics;  Laterality: Left;  LEFT LONG FINGER EXCISION MUCOID CYST AND DEBRIDEMENT DISTAL INTERPHALANGEAL JOINT, POSSIBLE ROTATION FLAP   ESOPHAGOGASTRODUODENOSCOPY ENDOSCOPY  Nov 12 2015   FOOT SURGERY     KNEE ARTHROSCOPY  2012   right knee,,, left knee at 15   LIGAMENT REPAIR  2002   Rt ankle  PELVIC LAPAROSCOPY  10/11/2005   LAPAROTOMY, LYSIS OF PELVIC ADHESIONS, RSO AND CAUTERIZATION AND TRANSECTION OF LEFT FALLOPIAN TUBE   PLICA, arthritis and cartilage repair  01/11/2011   on Rt knee   TUBAL LIGATION  09/2005   scar tissue removal ovarian cyst    Family History  Problem Relation Age of Onset   Arthritis Mother    Hypertension Mother    Cancer Mother        MELANOMA   Hyperlipidemia Mother    Non-Hodgkin's lymphoma Mother    Heart disease Father    Hypertension Maternal Grandmother    Dementia Maternal Grandmother    Hypertension Maternal Grandfather    Cancer Maternal Grandfather        MELANOMA   Heart disease Paternal Grandmother    Diabetes Paternal Grandfather     Heart disease Paternal Grandfather    Cancer - Ovarian Other        Mat Programme Researcher, Broadcasting/film/video    Social History   Socioeconomic History   Marital status: Married    Spouse name: Not on file   Number of children: Not on file   Years of education: Not on file   Highest education level: Some college, no degree  Occupational History   Not on file  Tobacco Use   Smoking status: Never   Smokeless tobacco: Never  Vaping Use   Vaping status: Never Used  Substance and Sexual Activity   Alcohol use: Yes    Comment: 1-2 DRINKS A MONTH    Drug use: No   Sexual activity: Yes    Partners: Male    Birth control/protection: Surgical    Comment: 1st intercourse- 15, parnters- 3, tubal  Other Topics Concern   Not on file  Social History Narrative   Occupation: unemployed   Married   HH of 3-4    Pet dogs 2   G3P2         Social Drivers of Corporate Investment Banker Strain: Low Risk  (09/20/2024)   Overall Financial Resource Strain (CARDIA)    Difficulty of Paying Living Expenses: Not very hard  Food Insecurity: No Food Insecurity (09/20/2024)   Hunger Vital Sign    Worried About Running Out of Food in the Last Year: Never true    Ran Out of Food in the Last Year: Never true  Transportation Needs: No Transportation Needs (09/20/2024)   PRAPARE - Administrator, Civil Service (Medical): No    Lack of Transportation (Non-Medical): No  Physical Activity: Sufficiently Active (09/20/2024)   Exercise Vital Sign    Days of Exercise per Week: 4 days    Minutes of Exercise per Session: 40 min  Stress: Stress Concern Present (09/20/2024)   [employer] of Occupational Health - Occupational Stress Questionnaire    Feeling of Stress: To some extent  Social Connections: Socially Integrated (09/20/2024)   Social Connection and Isolation Panel    Frequency of Communication with Friends and Family: More than three times a week    Frequency of Social Gatherings with Friends and  Family: Three times a week    Attends Religious Services: More than 4 times per year    Active Member of Clubs or Organizations: Yes    Attends Banker Meetings: More than 4 times per year    Marital Status: Married    Outpatient Medications Prior to Visit  Medication Sig Dispense Refill   cetirizine (ZYRTEC) 10 MG tablet Take 10 mg  by mouth daily.     Cholecalciferol 50 MCG (2000 UT) CAPS Vitamin d3  + k2     Continuous Glucose Sensor (FREESTYLE LIBRE 3 PLUS SENSOR) MISC USE AS DIRECTED. CHANGE EVERY 15 DAYS     estradiol  (VIVELLE -DOT) 0.025 MG/24HR Place 1 patch onto the skin 2 (two) times a week. 24 patch 3   fluticasone  (FLONASE ) 50 MCG/ACT nasal spray Place into both nostrils as needed for allergies or rhinitis.     ibuprofen  (ADVIL ) 600 MG tablet Take 1 tablet (600 mg total) by mouth every 6 (six) hours as needed (for pain). 60 tablet 0   metFORMIN  (GLUCOPHAGE ) 500 MG tablet 1,000 mg. (Patient taking differently: 500 mg.)     Probiotic Product (PROBIOTIC DAILY PO) Take by mouth.     progesterone  (PROMETRIUM ) 100 MG capsule Take 1 capsule (100 mg total) by mouth daily. 90 capsule 3   TURMERIC PO Take by mouth.     mupirocin  ointment (BACTROBAN ) 2 % Apply 1 Application topically 2 (two) times daily. (Patient not taking: Reported on 09/24/2024) 15 g 0   traMADol  (ULTRAM ) 50 MG tablet Take 1 tablet (50 mg total) by mouth every 6 (six) hours as needed. (Patient not taking: Reported on 09/24/2024) 15 tablet 0   No facility-administered medications prior to visit.     EXAM:  BP 114/74 (BP Location: Right Arm, Patient Position: Sitting, Cuff Size: Large)   Pulse 68   Temp 97.9 F (36.6 C) (Oral)   Ht 5' 5 (1.651 m)   Wt 216 lb 6.4 oz (98.2 kg)   LMP 10/12/2022 (Exact Date) Comment: sexually active-btl  SpO2 97%   BMI 36.01 kg/m   Body mass index is 36.01 kg/m. Wt Readings from Last 3 Encounters:  09/24/24 216 lb 6.4 oz (98.2 kg)  07/31/24 218 lb 0.6 oz (98.9 kg)   03/25/24 212 lb (96.2 kg)    Physical Exam: Vital signs reviewed HZW:Uypd is a well-developed well-nourished alert cooperative    who appearsr stated age in no acute distress.  HEENT: normocephalic atraumatic , Eyes: PERRL EOM's full, conjunctiva clear, Nares: paten,t no deformity discharge or tenderness., Ears: no deformity EAC's clear TMs with normal landmarks. Mouth: clear OP, no lesions, edema.  Moist mucous membranes. Dentition in adequate repair. NECK: supple without masses, thyromegaly or bruits. CHEST/PULM:  Clear to auscultation and percussion breath sounds equal no wheeze , rales or rhonchi. No chest wall deformities or tenderness. Breast: normal by inspection . No dimpling, discharge, masses, tenderness or discharge . CV: PMI is nondisplaced, S1 S2 no gallops, murmurs, rubs. Peripheral pulses are full without delay.No JVD .  ABDOMEN: Bowel sounds normal nontender  No guard or rebound, no hepato splenomegal no CVA tenderness.  Extremtities:  No clubbing cyanosis or edema, no acute joint swelling or redness no focal atrophy left finger cyst not examined  NEURO:  Oriented x3, cranial nerves 3-12 appear to be intact, no obvious focal weakness,gait within normal limits no abnormal reflexes or asymmetrical SKIN: No acute rashes normal turgor, color, no bruising or petechiae. PSYCH: Oriented, good eye contact, no obvious depression anxiety, cognition and judgment appear normal. LN: no cervical axillary adenopathy  Lab Results  Component Value Date   WBC 7.6 09/18/2023   HGB 14.1 09/18/2023   HCT 42.8 09/18/2023   PLT 248.0 09/18/2023   GLUCOSE 101 (H) 07/28/2024   CHOL 189 09/18/2023   TRIG 121.0 09/18/2023   HDL 52.90 09/18/2023   LDLCALC 112 (H) 09/18/2023  ALT 12 09/18/2023   AST 18 09/18/2023   NA 138 07/28/2024   K 4.6 07/28/2024   CL 107 07/28/2024   CREATININE 0.85 07/28/2024   BUN 22 (H) 07/28/2024   CO2 22 07/28/2024   TSH 1.61 09/18/2023   HGBA1C 6.2 09/18/2023     BP Readings from Last 3 Encounters:  09/24/24 114/74  07/31/24 126/82  03/25/24 112/82    Lab results presented from dr Tommas reviewed  Tc 183 tg 129 Ldl 110 Cmp bg 108 alk phos 146 A1c 6.0 cr 0.82  gfr 84 Tsh 4.22  ASSESSMENT AND PLAN:  Discussed the following assessment and plan:    ICD-10-CM   1. Visit for preventive health examination  Z00.00     2. Dysmetabolic syndrome  E88.810    followed  Dr Tommas plans  weight managment  discussed    Disc sleep Continue monitoring Dr Tommas Weight management   planned   If no change 1 year cpe of other  Return in about 1 year (around 09/24/2025).  Patient Care Team: Jaesean Litzau, Apolinar POUR, MD as PCP - General (Internal Medicine) Rubie Kemps, MD (Orthopedic Surgery) Mable Lenis, MD (Inactive) (Otolaryngology) Tommas Pears, MD as Referring Physician (Endocrinology) Prentiss Annabella LABOR, NP as Nurse Practitioner (Gynecology) Patient Instructions  Good to see you today .  Optimize sleep  as discussed .  Fu yearly  Think about hep b vaccine ( x2)  as discussed .      Taniah Reinecke K. Lanetra Hartley M.D.

## 2024-09-24 NOTE — Patient Instructions (Addendum)
 Good to see you today .  Optimize sleep  as discussed .  Fu yearly  Think about hep b vaccine ( x2)  as discussed .

## 2024-09-29 DIAGNOSIS — Z1231 Encounter for screening mammogram for malignant neoplasm of breast: Secondary | ICD-10-CM | POA: Diagnosis not present

## 2024-09-29 LAB — HM MAMMOGRAPHY

## 2024-09-30 ENCOUNTER — Encounter: Payer: Self-pay | Admitting: Internal Medicine

## 2024-10-02 DIAGNOSIS — M67442 Ganglion, left hand: Secondary | ICD-10-CM | POA: Diagnosis not present

## 2024-10-02 DIAGNOSIS — M79645 Pain in left finger(s): Secondary | ICD-10-CM | POA: Diagnosis not present

## 2024-10-08 DIAGNOSIS — Z0289 Encounter for other administrative examinations: Secondary | ICD-10-CM

## 2024-10-09 ENCOUNTER — Encounter (INDEPENDENT_AMBULATORY_CARE_PROVIDER_SITE_OTHER): Payer: Self-pay | Admitting: Family Medicine

## 2024-10-09 ENCOUNTER — Ambulatory Visit (INDEPENDENT_AMBULATORY_CARE_PROVIDER_SITE_OTHER): Admitting: Family Medicine

## 2024-10-09 VITALS — BP 139/74 | HR 85 | Temp 98.3°F | Ht 64.5 in | Wt 211.0 lb

## 2024-10-09 DIAGNOSIS — E7841 Elevated Lipoprotein(a): Secondary | ICD-10-CM

## 2024-10-09 DIAGNOSIS — Z6835 Body mass index (BMI) 35.0-35.9, adult: Secondary | ICD-10-CM

## 2024-10-09 DIAGNOSIS — E669 Obesity, unspecified: Secondary | ICD-10-CM

## 2024-10-09 DIAGNOSIS — K7581 Nonalcoholic steatohepatitis (NASH): Secondary | ICD-10-CM

## 2024-10-09 DIAGNOSIS — R7303 Prediabetes: Secondary | ICD-10-CM | POA: Diagnosis not present

## 2024-10-09 NOTE — Progress Notes (Signed)
 Brandy DOROTHA Jenkins, DO, ABFM, ABOM Bariatric physician [address] [place], [place], [place] [address] Office: [phone]  /  Fax: [phone]     Initial Evaluation:  Brandy Maynard was seen in clinic today to evaluate for obesity. She is interested in losing weight to improve overall health and reduce the risk of weight related complications. She presents today to review program treatment options, initial physical assessment, and evaluation.      She was referred by: Self-Referral  When asked what they hope to accomplish? She states: improve existing medical conditions, improve quality of life and have accountability  When asked how has your weight affected you? She states: Contributed to medical problems, Having fatigue, Having poor endurance, and Has affected mood   Contributing factors to her weight change: family history of obesity, moderate to high levels of stress, reduced physical activity, and having children and menopause  Some associated conditions: Arthritis:Knees, Hyperlipidemia, MASLD, Prediabetes, and GERD  Current nutrition plan: Low-carb, High-protein, and Other: whole foods  Current level of physical activity: Other: FIA 3 days a week   Current or previous pharmacotherapy: Metformin  and Phentermine   Response to medication: Had side effects so it was discontinued   Barriers to weight loss that patient expresses a concern about today: strong hunger signals and/or impaired satiety / inhibitory control, presence of obesogenic drugs, moderate to high levels of stress, menopause, and emotional eating.     Past Medical History:  Diagnosis Date   Allergy    ANEMIA 12/15/2009   Qualifier: Diagnosis of  By: Charlett MD, Apolinar POUR    Depression    Fatty liver    GERD (gastroesophageal reflux disease)    Heavy menses    on ocps   IBS (irritable bowel syndrome)    by hx and had a neg colonscopy   Migraines    Pre-diabetes     Current Outpatient Medications   Medication Instructions   cetirizine (ZYRTEC) 10 mg, Daily   Cholecalciferol 50 MCG (2000 UT) CAPS Vitamin d3  + k2   Continuous Glucose Sensor (FREESTYLE LIBRE 3 PLUS SENSOR) MISC USE AS DIRECTED. CHANGE EVERY 15 DAYS   estradiol  (VIVELLE -DOT) 0.025 MG/24HR 1 patch, Transdermal, 2 times weekly   fluticasone  (FLONASE ) 50 MCG/ACT nasal spray As needed   ibuprofen  (ADVIL ) 600 mg, Oral, Every 6 hours PRN   metFORMIN  (GLUCOPHAGE ) 1,000 mg   mupirocin  ointment (BACTROBAN ) 2 % 1 Application, Topical, 2 times daily   Probiotic Product (PROBIOTIC DAILY PO) Take by mouth.   progesterone  (PROMETRIUM ) 100 mg, Oral, Daily   traMADol  (ULTRAM ) 50 mg, Oral, Every 6 hours PRN   TURMERIC PO Take by mouth.     Allergies[1]   Past Surgical History:  Procedure Laterality Date   ARTHROTOMY Left 07/31/2024   Procedure: ARTHROTOMY;  Surgeon: Murrell Drivers, MD;  Location: [facility];  Service: Orthopedics;  Laterality: Left;   CHOLECYSTECTOMY     COLONOSCOPY  08/03/2005   COLONOSCOPY W/ BIOPSIES  Nov 12 2015   CYST REMOVAL HAND Left 07/31/2024   Procedure: REMOVAL, CYST, HAND;  Surgeon: Murrell Drivers, MD;  Location: [facility];  Service: Orthopedics;  Laterality: Left;  LEFT LONG FINGER EXCISION MUCOID CYST AND DEBRIDEMENT DISTAL INTERPHALANGEAL JOINT, POSSIBLE ROTATION FLAP   ESOPHAGOGASTRODUODENOSCOPY ENDOSCOPY  Nov 12 2015   FOOT SURGERY     KNEE ARTHROSCOPY  2012   right knee,,, left knee at 15   LIGAMENT REPAIR  2002   Rt ankle  PELVIC LAPAROSCOPY  10/11/2005   LAPAROTOMY, LYSIS OF PELVIC ADHESIONS, RSO AND CAUTERIZATION AND TRANSECTION OF LEFT FALLOPIAN TUBE   PLICA, arthritis and cartilage repair  01/11/2011   on Rt knee   TUBAL LIGATION  09/2005   scar tissue removal ovarian cyst     Family History  Problem Relation Age of Onset   Arthritis Mother    Hypertension Mother    Cancer Mother        MELANOMA   Hyperlipidemia Mother    Non-Hodgkin's lymphoma Mother     Heart disease Father    Hypertension Maternal Grandmother    Dementia Maternal Grandmother    Hypertension Maternal Grandfather    Cancer Maternal Grandfather        MELANOMA   Heart disease Paternal Grandmother    Diabetes Paternal Grandfather    Heart disease Paternal Grandfather    Cancer - Ovarian Other        Mat Great Grandmother     Objective:  BP 139/74   Pulse 85   Temp 98.3 F (36.8 C)   Ht 5' 4.5 (1.638 m)   Wt 211 lb (95.7 kg)   LMP 10/12/2022 Comment: sexually active-btl  SpO2 98%   BMI 35.66 kg/m  She was weighed on the bioimpedance scale: Body mass index is 35.66 kg/m.  Visceral Fat rating : 12, Body Fat %:44.6  Weight Lost Since Last Visit: 0  Weight Gained Since Last Visit: 0  Vitals Temp: 98.3 F (36.8 C) BP: 139/74 Pulse Rate: 85 SpO2: 98 %   Anthropometric Measurements Height: 5' 4.5 (1.638 m) Weight: 211 lb (95.7 kg) BMI (Calculated): 35.67 Weight at Last Visit: 0 Weight Lost Since Last Visit: 0 Weight Gained Since Last Visit: 0 Starting Weight: 0 Total Weight Loss (lbs): 0 lb (0 kg) Peak Weight: 260lb   Body Composition  Body Fat %: 44.6 % Fat Mass (lbs): 94.4 lbs Muscle Mass (lbs): 111.2 lbs Total Body Water (lbs): 83.2 lbs Visceral Fat Rating : 12   Other Clinical Data Fasting: no Labs: no Today's Visit #: Consult     General: Well Developed, well nourished, and in no acute distress.  HEENT: Normocephalic, atraumatic; EOMI, sclerae are anicteric. Skin: Warm and dry, good turgor Chest:  Normal excursion, shape, no gross ABN Respiratory: No conversational dyspnea; speaking in full sentences NeuroM-Sk:  Normal gross ROM * 4 extremities  Psych: A and O *3, insight adequate, mood- full    Assessment and Plan:   FOR THE DISEASE OF OBESITY:  BMI 35.0-35.9,adult Assessment & Plan: We reviewed anthropometrics, biometrics, associated medical conditions and contributing factors with patient. [name] would benefit from a  medically tailored reduced calorie nutrional plan based on their REE (resting energy expenditure), which will be determined by indirect calorimetry.  We will also assess for cardiometabolic risk and nutritional derangements via fasting labs at intake appointment.    Obesity Treatment / Action Plan:   she was weighed on the bioimpedance scale and results were discussed and documented in the synopsis.   Grete Bosko Powless will complete provided nutritional and psychosocial assessment questionnaire before the next appointment.  she will be scheduled for indirect calorimetry to determine resting energy expenditure in a fasting state.  This will allow us  to create a reduced calorie, high-protein meal plan to promote loss of fat mass while preserving muscle mass.  We will also assess for cardiometabolic risk and nutritional derangements via an ECG and fasting serologies at her next appointment.  she  was encouraged to work on amassing support from family and friends to begin their weight loss journey.   Work on eliminating or reducing the presence of highly processed, poorly nutritious, calorie-dense foods in the home.   Obesity Education Performed Today:  Patient was counseled on nutritional approaches to weight loss and benefits of reducing processed foods and consuming plant-based foods and high quality protein as part of nutritional weight management program.   We discussed the importance of long term lifestyle changes which include nutrition, exercise and behavioral modifications as well as the importance of customizing this to her specific health and social needs.   We discussed the benefits of reaching a healthier weight to alleviate the symptoms of existing conditions and reduce the risks of the biomechanical, metabolic and psychological effects of obesity.  Was counseled on the health benefits of losing 5%-10% of total body weight.  Was counseled on our cognitive behavorial therapy  program, lead by our bariatric psychologist, who focuses on emotional eating and creating positive behavorial change.  Was counseled on bariatric pharmacotherapy and how this may be used as an adjunct in their weight management    Velina appears to be in the action stage of change and states they are ready to start intensive lifestyle modifications and behavioral modifications.  It was recommended that she follow up in the next 1-2 weeks to review the above steps, and to continue with treatment of their chronic disease state of obesity   FOR OTHER CONDITIONS RELATED TO THE DISEASE OF OBESITY:  Prediabetes Assessment & Plan Lab Results  Component Value Date   HGBA1C 6.0 08/28/2024   HGBA1C 6.2 09/18/2023   HGBA1C 5.9 02/27/2022    On Metformin  500 mg once daily. With reported good compliance and tolerance. Was previously on 1000 mg but she was able to get her A1C in check and reduced her dose to 500 mg. Patient states that she was borderline prediabetic for a while. She reports that her husband was a diagnosed with Type 2 DM about 3 years ago. This caused this patient to start wearing a libre continuous glucose monitor. She uses this as a tool to see what her triggers are. She reports eating well and being aware of what she eats. She took a prediabetic class after having her children.   Reeve would benefit from a program like this that focuses on a low simple carb and sugar meal. Starting a weight loss journey would also help lower and stabilize patients A1C levels and reduce cravings.     Elevated lipoprotein(a) Assessment & Plan Lab Results  Component Value Date   CHOL 183 08/28/2024   HDL 50 08/28/2024   LDLCALC 110 08/28/2024   TRIG 129 08/28/2024   CHOLHDL 4 09/18/2023   Patient states that she is aware that she has an elevated LDL. She is unsure of when she was diagnosed. She is not on any medications. Patient would benefit in a program like this that has a low saturated and  trans fat meal plan.     Metabolic dysfunction-associated steatohepatitis (MASH) Assessment & Plan Lab Results  Component Value Date   ALT 12 08/28/2024   AST 16 08/28/2024   ALKPHOS 146 (A) 08/28/2024   BILITOT 0.5 09/18/2023   Patient states that she was diagnosed with fatty liver a little bit over a year ago. She reports that she has had an ultrasound that supported the fatty liver diagnoses. Patient states that she was told she needs to lose weight  in order to help reduce the fat around her liver. She would benefit from a program like this that focuses on high protein low calorie meal.     Attestations:   I, Sonny Laroche, acting as a medical scribe for Brandy Jenkins, DO., have compiled all relevant documentation for today's office visit on behalf of Brandy Jenkins, DO, while in the presence of [employer], DO.  I have spent 44 minutes in the care of the patient today.  34 minutes was spent in face to face counseling of the patient on the disease of obesity and what our program can do for their medical conditions as well as in preventing future diseases. I discussed the importance of comprehensive care in the treatment of obesity including mental well being and physical activity. 10 minutes was spent on pre-chart review and additional post visit documentation.   I have reviewed the above documentation for accuracy and completeness, and I agree with the above. Brandy Maynard, D.O.  The 21st Century Cures Act was signed into law in 2016 which includes the topic of electronic health records.  This provides immediate access to information in MyChart.  This includes consultation notes, operative notes, office notes, lab results and pathology reports.  If you have any questions about what you read please let us  know at your next visit so we can discuss your concerns and take corrective action if need be.  We are right here with you!    [1]  Allergies Allergen Reactions    Junel Fe 1-20 [Norethin  Ace-Eth Estrad-Fe] Other (See Comments)     migraine  With visual aura ;elevated blood pressures   Penicillins     Hives. Eyes swell shut.    Vicodin [Hydrocodone -Acetaminophen ] Itching

## 2024-10-14 ENCOUNTER — Ambulatory Visit (INDEPENDENT_AMBULATORY_CARE_PROVIDER_SITE_OTHER): Admitting: Family Medicine

## 2024-10-14 ENCOUNTER — Encounter (INDEPENDENT_AMBULATORY_CARE_PROVIDER_SITE_OTHER): Payer: Self-pay | Admitting: Family Medicine

## 2024-10-14 VITALS — BP 105/71 | HR 74 | Temp 97.5°F | Ht 64.5 in | Wt 210.0 lb

## 2024-10-14 DIAGNOSIS — Z6835 Body mass index (BMI) 35.0-35.9, adult: Secondary | ICD-10-CM | POA: Diagnosis not present

## 2024-10-14 DIAGNOSIS — F39 Unspecified mood [affective] disorder: Secondary | ICD-10-CM

## 2024-10-14 DIAGNOSIS — R0602 Shortness of breath: Secondary | ICD-10-CM

## 2024-10-14 DIAGNOSIS — F5089 Other specified eating disorder: Secondary | ICD-10-CM

## 2024-10-14 DIAGNOSIS — R5383 Other fatigue: Secondary | ICD-10-CM

## 2024-10-14 DIAGNOSIS — R7303 Prediabetes: Secondary | ICD-10-CM | POA: Diagnosis not present

## 2024-10-14 DIAGNOSIS — K7581 Nonalcoholic steatohepatitis (NASH): Secondary | ICD-10-CM

## 2024-10-14 DIAGNOSIS — Z1331 Encounter for screening for depression: Secondary | ICD-10-CM

## 2024-10-14 DIAGNOSIS — E559 Vitamin D deficiency, unspecified: Secondary | ICD-10-CM | POA: Diagnosis not present

## 2024-10-14 DIAGNOSIS — E7841 Elevated Lipoprotein(a): Secondary | ICD-10-CM

## 2024-10-14 DIAGNOSIS — N979 Female infertility, unspecified: Secondary | ICD-10-CM | POA: Insufficient documentation

## 2024-10-14 DIAGNOSIS — E669 Obesity, unspecified: Secondary | ICD-10-CM

## 2024-10-14 NOTE — Progress Notes (Signed)
 Brandy Maynard, D.O.  ABFM, ABOM Specializing in Clinical Bariatric Medicine Office located at: [address]  [place], [place]  [address]   Bariatric Medicine Visit  Dear Charlett, Apolinar POUR, MD   Thank you for referring Brandy Maynard to our clinic today for evaluation.  We performed a consultation to discuss her options for treatment and educate the patient on her disease state.  The following note includes my evaluation and treatment recommendations.   Please do not hesitate to reach out to me directly if you have any further concerns.    Assessment and Plan:   Orders Placed This Encounter  Procedures   Insulin , random   Folate   Vitamin B12   VITAMIN D  25 Hydroxy (Vit-D Deficiency, Fractures)   TSH   T4, free   T3   Magnesium   Lipid panel   Hemoglobin A1c   Comprehensive metabolic panel with GFR   CBC with Differential/Platelet   Ambulatory referral to Psychology     FOR THE DISEASE OF OBESITY:  BMI 35.0-35.9,adult-start BMI 35.5 Assessment & Plan: [name] is currently in the action stage of change. As such, her goal is to start our weight management plan.  She has agreed to implement: follow the Category 1 plan w/ lunch options or Journal 1100 calories with 90+ g protein   Behavioral Intervention We discussed the following Behavioral Modification Strategies today: increasing lean protein intake to established goals, avoiding skipping meals, work on meal planning and preparation, work on tracking and journaling calories using tracking application, reading food labels , keeping healthy foods at home, focusing on food with a 10:1 ratio of calories: grams of protein, and using GPT or another AI platform for recipe ideas- searching low calorie, low carb, high protein chicken recipes etc  Additional resources provided today: Handout on CAT 1 meal plan  and Handout on Daily Food Journaling LogHandout on Nurturing a healthy relationship with food  Evidence-based  interventions for health behavior change were utilized today including the discussion of self monitoring techniques, problem-solving barriers and SMART goal setting techniques.    Goal(s) for next OV: weigh protein and measure proteins     Recommended Physical Activity Goals Danaye has been advised to work up to 300-450  minutes of moderate intensity aerobic activity a week and strengthening exercises 2-3 times per week for cardiovascular health, weight loss maintenance and preservation of muscle mass.   She has agreed to : maintain current level of activity.    Pharmacotherapy We discussed various medication options to help Brandy Maynard with her weight loss efforts and we both agreed to: Begin with nutritional and behavioral strategies   ASSOCIATED CONDITIONS ADDRESSED TODAY:  Fatigue Assessment & Plan: Brandy Maynard does feel that her weight is causing her energy to be lower than it should be. Fatigue may be related to obesity, depression or many other causes. she does not appear to have any red flag symptoms and this appears to most likely be related to her current lifestyle habits and dietary intake.  Labs will be ordered and reviewed with her at their next office visit in two weeks.  Epworth sleepiness scale is 4 and appears to be within normal limits. Brandy Maynard reports daytime somnolence and reports waking up still tired. Patient has a history of symptoms of morning fatigue. Brandy Maynard generally gets 6 hours of sleep per night, and states that she has generally restful sleep. Snoring is not present. Apneic episodes are not present.   ECG: Performed 07/28/24  and reviewed/ interpreted independently.  Normal sinus rhythm, rate 68 bpm; reassuring without any acute abnormalities, will continue to monitor for symptoms     Shortness of breath on exertion Assessment & Plan: Brandy Maynard does feel that she gets out of breath more easily than she used to when she exercises and seems to be worsening over time with weight gain.   This has gotten worse recently. Brandy Maynard denies shortness of breath at rest or orthopnea. Pt denies chest pain, dizziness, heart palpitations, or excessive diaphoresis or nausea with activity.  This is not new and is ongoing.  Brandy Maynard's shortness of breath appears to be obesity related and exercise induced, as they do not appear to have any red flag symptoms/ concerns today.  Also, this condition appears to be related to a state of poor cardiovascular conditioning   Obtain labs today and will be reviewed with her at their next office visit in two weeks.  Indirect Calorimeter completed today to help guide our dietary regimen. It shows a VO2 of 242 and a REE of 1670.  Her calculated basal metabolic rate is 8369 thus her resting energy expenditure is better than expected.  Patient agreed to work on weight loss at this time.  As Brandy Maynard progresses through our weight loss program, we will gradually increase exercise as tolerated to treat her current condition.   If Brandy Maynard follows our recommendations and loses 5-10% of their weight without improvement of her shortness of breath or if at any time, symptoms become more concerning, they agree to urgently follow up with their PCP/ specialist for further consideration/ evaluation.   Brandy Maynard verbalizes agreement with this plan.     H/o Mood disorder- with current emotional eating Depression Screen  Assessment & Plan: Her PHQ-9 score was 3 today. Making life not difficult at all. Denies any SI/HI. Patient states that she eats when she is stressed,sad,bored,angry, when feeling guilty, to help comfort yourself and as a reward. Patients mother was anorexic. She states that she felt judged about her weight since she was younger. Explained to patient about the importance of seeing someone who can help talk about strategies with emotional eating and the feeling around food. Referral was sent to Dr.Barker. Patient will make an appointment with her in the future. Begin building  strategies with specialist. Will follow alongside specialist.     Prediabetes Assessment & Plan Lab Results  Component Value Date   HGBA1C 6.0 08/28/2024   HGBA1C 6.2 09/18/2023   HGBA1C 5.9 02/27/2022    On Metformin  500 mg daily. With reported good compliance and tolerance. Patient states that she has been taking this medication for a couple of years. She reports that over the summer she did not take the medication consistently. Explained to patient the correlation between protein and cravings. Focus on eating all proteins on meal plan. Begin following meal plan and decreasing simple carbs and sugars. Will obtain labs today and review at next OV.    Metabolic dysfunction-associated steatohepatitis (MASH) Assessment & Plan    Component Value Date/Time   PROT 7.1 09/18/2023 1239   ALBUMIN 4.3 08/28/2024 0000   AST 16 08/28/2024 0000   ALT 12 08/28/2024 0000   ALKPHOS 146 (A) 08/28/2024 0000   BILITOT 0.5 09/18/2023 1239   BILIDIR 0.10 08/07/2022 0000   IBILI 0.1 02/05/2012 0848   Patient states that she is aware that she has fatty liver. She has been trying to eat healthier and make diet and life style changes. Will recheck liver  enzymes today. Begin following prudent meal .  Current visceral fat rating: 12.  The visceral fat rating should be < 12 in a female. Visceral adipose tissue is a hormonally active component of total body fat. This body composition phenotype is associated with medical disorders such as metabolic syndrome, cardiovascular disease and several malignancies including prostate, breast, and colorectal cancers. Lose 7-10% of weight via prudent nutritional plan and lifestyle changes.     Vitamin D  deficiency Assessment & Plan Lab Results  Component Value Date   VD25OH 66.4 08/07/2022   VD25OH 69.97 02/27/2022   VD25OH 77.54 02/25/2021   Taking OTC Vit D3 with K2 2000 UT daily. With reported good compliance and tolerance. Will recheck Vit D levels today.  Continue with supplementation.     Elevated lipoprotein(a) Assessment & Plan Lab Results  Component Value Date   CHOL 183 08/28/2024   HDL 50 08/28/2024   LDLCALC 110 08/28/2024   TRIG 129 08/28/2024   CHOLHDL 4 09/18/2023    Diet and life style controlled. Patient denies being on any medication. She was unaware that she had an elevated LDL level in the past. Begin following prudent meal plan and decreasing saturated and trans fat.     FOLLOW UP:   Follow up in 2 weeks. She was informed of the importance of frequent follow up visits to maximize her success with intensive lifestyle modifications for her multiple health conditions.  LEAR CARSTENS is aware that we will review all of her lab results at our next visit.  She is aware that if anything is critical/ life threatening with the results, we will be contacting her via MyChart prior to the office visit to discuss management.     Chief Complaint:   OBESITY MAREESA GATHRIGHT (MR# [id number]) is a pleasant 53 y.o. female who presents for evaluation and treatment of obesity and related comorbidities. Current BMI is Body mass index is 35.49 kg/m. CLAUDINE STALLINGS has been struggling with her weight for many years and has been unsuccessful in either losing weight, maintaining weight loss, or reaching her healthy weight goal.  ALARA DANIEL is currently in the action stage of change and ready to dedicate time achieving and maintaining a healthier weight. MILES BORKOWSKI is interested in becoming our patient and working on intensive lifestyle modifications including (but not limited to) diet and exercise for weight loss.  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 Patient is married to [place] and has children. She lives with her husband and daughter.  - She exercises 35 to 45 minutes 3 to 5 times a week in boot camp or walking.   - Wishes to be 150 lbs in 12 months   - Started  gaining weight after infertility depression in 1993/1994 and never lost it.   - Heaviest weight was 255 lbs.   - Lost 24 lbs in 2022 by doing [employer] because it had planned portions and little room for error.   - Eats outside of home 3 to 4 times a week.   - Eats fast food or take out 1 to 2 times a week.   - Enjoys cooking but lacks time to plan.  - Craves burger,fries and potato chips.  - Snacks on quest chips or wild protein chips, nuts, cracks and various carbs.   - Occasionally skips breakfast one day a week.   - Drinks caloric beverages like: coffee with  milk, unsweetened tea, Equip protein powder and liquor drink 1 or 2 times a month.   - Worst food habit is snacking in afternoon, and eating chips in front of TV out of boredom to help relax.  - Struggles with eating too little at times.     Subjective:   This is the patient's first visit at [facility].  The patient's NEW PATIENT PACKET that they filled out prior to today's office visit was reviewed at length and information from that paperwork was included within the following office visit note.    Included in the packet: current and past health history, medications, allergies, ROS, gynecologic history (women only), surgical history, family history, social history, weight history, weight loss surgery history (for those that have had weight loss surgery), nutritional evaluation, mood and food questionnaire along with a depression screening (PHQ9) on all patients, an Epworth questionnaire, sleep habits questionnaire, patient life and health improvement goals questionnaire. These will all be scanned into the patient's chart under the media tab.   Review of Systems: Please refer to new patient packet scanned into media. Pertinent positives were addressed with patient today.  Reviewed by clinician on day of visit: allergies, medications, problem list, medical history, surgical history, family history, social  history, and previous encounter notes.  During the visit, I independently reviewed the patient's EKG, bioimpedance scale results, and indirect calorimeter results. I used this information to tailor a meal plan for the patient that will help TAKIESHA MCDEVITT to lose weight and will improve her obesity-related conditions going forward.  I performed a medically necessary appropriate examination and/or evaluation. I discussed the assessment and treatment plan with the patient. The patient was provided an opportunity to ask questions and all were answered. The patient agreed with the plan and demonstrated an understanding of the instructions. Labs were ordered today (unless patient declined them) and will be reviewed with the patient at our next visit unless more critical results need to be addressed immediately. Clinical information was updated and documented in the EMR.    Objective:   PHYSICAL EXAM: Blood pressure 105/71, pulse 74, temperature (!) 97.5 F (36.4 C), height 5' 4.5 (1.638 m), weight 210 lb (95.3 kg), last menstrual period 10/12/2022, SpO2 96%. Body mass index is 35.49 kg/m.  General: Well Developed, well nourished, and in no acute distress.  HEENT: Normocephalic, atraumatic; EOMI, sclerae are anicteric. Skin: Warm and dry, good turgor Chest:  Normal excursion, shape, no gross ABN Respiratory: No conversational dyspnea; speaking in full sentences NeuroM-Sk:  Normal gross ROM * 4 extremities  Psych: A and O *3, insight adequate, mood- full   Anthropometric Measurements Height: 5' 4.5 (1.638 m) Weight: 210 lb (95.3 kg) BMI (Calculated): 35.5 Weight at Last Visit: 0 Weight Lost Since Last Visit: 0 Weight Gained Since Last Visit: 0 Starting Weight: 210lb Total Weight Loss (lbs): 0 lb (0 kg) Peak Weight: 260lb Waist Measurement : 41.5 inches   Body Composition  Body Fat %: 44.5 % Fat Mass (lbs): 93.6 lbs Muscle Mass (lbs): 110.8 lbs Total Body Water (lbs): 80.6  lbs Visceral Fat Rating : 12   Other Clinical Data RMR: 1670 Fasting: Yes Labs: Yes Today's Visit #: 1 Starting Date: 10/14/24    DIAGNOSTIC DATA REVIEWED:  BMET    Component Value Date/Time   NA 138 08/28/2024 0000   K 4.5 08/28/2024 0000   CL 101 08/28/2024 0000   CO2 21 08/28/2024 0000   GLUCOSE 101 (H) 07/28/2024 0900  BUN 18 08/28/2024 0000   CREATININE 0.8 08/28/2024 0000   CREATININE 0.85 07/28/2024 0900   CREATININE 0.78 02/05/2012 0848   CALCIUM 9.8 08/28/2024 0000   GFRNONAA >60 07/28/2024 0900   GFRAA 89 05/17/2020 0000   Lab Results  Component Value Date   HGBA1C 6.0 08/28/2024   HGBA1C 6.2 03/10/2010   No results found for: INSULIN  Lab Results  Component Value Date   TSH 4.22 08/28/2024   CBC    Component Value Date/Time   WBC 7.6 09/18/2023 1239   RBC 4.78 09/18/2023 1239   HGB 14.1 09/18/2023 1239   HCT 42.8 09/18/2023 1239   PLT 248.0 09/18/2023 1239   MCV 89.7 09/18/2023 1239   MCH 29.6 06/13/2015 2132   MCHC 32.9 09/18/2023 1239   RDW 13.1 09/18/2023 1239   Iron Studies    Component Value Date/Time   IRON 79 05/17/2020 0000   TIBC 336 08/29/2017 1123   FERRITIN 37 05/17/2020 0000   IRONPCTSAT 46 08/29/2017 1123   Lipid Panel     Component Value Date/Time   CHOL 183 08/28/2024 0000   TRIG 129 08/28/2024 0000   HDL 50 08/28/2024 0000   CHOLHDL 4 09/18/2023 1239   VLDL 24.2 09/18/2023 1239   LDLCALC 110 08/28/2024 0000   Hepatic Function Panel     Component Value Date/Time   PROT 7.1 09/18/2023 1239   ALBUMIN 4.3 08/28/2024 0000   AST 16 08/28/2024 0000   ALT 12 08/28/2024 0000   ALKPHOS 146 (A) 08/28/2024 0000   BILITOT 0.5 09/18/2023 1239   BILIDIR 0.10 08/07/2022 0000   IBILI 0.1 02/05/2012 0848      Component Value Date/Time   TSH 4.22 08/28/2024 0000   TSH 1.61 09/18/2023 1239   Nutritional Lab Results  Component Value Date   VD25OH 66.4 08/07/2022   VD25OH 69.97 02/27/2022   VD25OH 77.54 02/25/2021     Attestation Statements:   LILLETTE Sonny Laroche, acting as a stage manager for Brandy Jenkins, DO., have compiled all relevant documentation for today's office visit on behalf of Brandy Jenkins, DO, while in the presence of [employer], DO.  I have spent 66 minutes in the care of the patient today including: 42 minutes face-to-face assessing and reviewing listed medical problems above as outlined in office visit note, providing nutritional and behavioral counseling as outlined in obesity care plan, independently interpreting results and goals of care, see listed medical problems, and discussing biometric information and progress. 24 minutes was spent on review of chart/new patient packet and additional post-visit documentation.  I have reviewed the above documentation for accuracy and completeness, and I agree with the above. Brandy JINNY Maynard, D.O.  The 21st Century Cures Act was signed into law in 2016 which includes the topic of electronic health records.  This provides immediate access to information in MyChart.  This includes consultation notes, operative notes, office notes, lab results and pathology reports.  If you have any questions about what you read please let us  know at your next visit so we can discuss your concerns and take corrective action if need be.  We are right here with you.

## 2024-10-16 LAB — T3: T3, Total: 104 ng/dL (ref 71–180)

## 2024-10-16 LAB — CBC WITH DIFFERENTIAL/PLATELET
Basophils Absolute: 0.1 x10E3/uL (ref 0.0–0.2)
Basos: 1 %
EOS (ABSOLUTE): 0.1 x10E3/uL (ref 0.0–0.4)
Eos: 1 %
Hematocrit: 44.7 % (ref 34.0–46.6)
Hemoglobin: 14.6 g/dL (ref 11.1–15.9)
Immature Grans (Abs): 0 x10E3/uL (ref 0.0–0.1)
Immature Granulocytes: 0 %
Lymphocytes Absolute: 1.6 x10E3/uL (ref 0.7–3.1)
Lymphs: 24 %
MCH: 29.8 pg (ref 26.6–33.0)
MCHC: 32.7 g/dL (ref 31.5–35.7)
MCV: 91 fL (ref 79–97)
Monocytes Absolute: 0.4 x10E3/uL (ref 0.1–0.9)
Monocytes: 6 %
Neutrophils Absolute: 4.4 x10E3/uL (ref 1.4–7.0)
Neutrophils: 68 %
Platelets: 266 x10E3/uL (ref 150–450)
RBC: 4.9 x10E6/uL (ref 3.77–5.28)
RDW: 12 % (ref 11.7–15.4)
WBC: 6.5 x10E3/uL (ref 3.4–10.8)

## 2024-10-16 LAB — COMPREHENSIVE METABOLIC PANEL WITH GFR
ALT: 10 IU/L (ref 0–32)
AST: 17 IU/L (ref 0–40)
Albumin: 4.3 g/dL (ref 3.8–4.9)
Alkaline Phosphatase: 134 IU/L (ref 49–135)
BUN/Creatinine Ratio: 18 (ref 9–23)
BUN: 15 mg/dL (ref 6–24)
Bilirubin Total: 0.4 mg/dL (ref 0.0–1.2)
CO2: 22 mmol/L (ref 20–29)
Calcium: 9.8 mg/dL (ref 8.7–10.2)
Chloride: 104 mmol/L (ref 96–106)
Creatinine, Ser: 0.82 mg/dL (ref 0.57–1.00)
Globulin, Total: 2.5 g/dL (ref 1.5–4.5)
Glucose: 98 mg/dL (ref 70–99)
Potassium: 4.3 mmol/L (ref 3.5–5.2)
Sodium: 141 mmol/L (ref 134–144)
Total Protein: 6.8 g/dL (ref 6.0–8.5)
eGFR: 84 mL/min/1.73 (ref >59)

## 2024-10-16 LAB — LIPID PANEL
Chol/HDL Ratio: 3 ratio (ref 0.0–4.4)
Cholesterol, Total: 176 mg/dL (ref 100–199)
HDL: 59 mg/dL (ref >39)
LDL Chol Calc (NIH): 97 mg/dL (ref 0–99)
Triglycerides: 109 mg/dL (ref 0–149)
VLDL Cholesterol Cal: 20 mg/dL (ref 5–40)

## 2024-10-16 LAB — TSH: TSH: 2.16 u[IU]/mL (ref 0.450–4.500)

## 2024-10-16 LAB — MAGNESIUM: Magnesium: 2.2 mg/dL (ref 1.6–2.3)

## 2024-10-16 LAB — HEMOGLOBIN A1C
Est. average glucose Bld gHb Est-mCnc: 128 mg/dL
Hgb A1c MFr Bld: 6.1 % — ABNORMAL HIGH (ref 4.8–5.6)

## 2024-10-16 LAB — VITAMIN B12: Vitamin B-12: 501 pg/mL (ref 232–1245)

## 2024-10-16 LAB — INSULIN, RANDOM: INSULIN: 5 u[IU]/mL (ref 2.6–24.9)

## 2024-10-16 LAB — FOLATE: Folate: 9 ng/mL (ref >3.0)

## 2024-10-16 LAB — T4, FREE: Free T4: 1.11 ng/dL (ref 0.82–1.77)

## 2024-10-16 LAB — VITAMIN D 25 HYDROXY (VIT D DEFICIENCY, FRACTURES): Vit D, 25-Hydroxy: 77.4 ng/mL (ref 30.0–100.0)

## 2024-11-03 ENCOUNTER — Encounter (INDEPENDENT_AMBULATORY_CARE_PROVIDER_SITE_OTHER): Payer: Self-pay | Admitting: Family Medicine

## 2024-11-03 ENCOUNTER — Ambulatory Visit (INDEPENDENT_AMBULATORY_CARE_PROVIDER_SITE_OTHER): Admitting: Family Medicine

## 2024-11-03 VITALS — BP 110/73 | HR 65 | Temp 98.1°F | Ht 64.5 in | Wt 211.0 lb

## 2024-11-03 DIAGNOSIS — F5089 Other specified eating disorder: Secondary | ICD-10-CM | POA: Diagnosis not present

## 2024-11-03 DIAGNOSIS — E7841 Elevated Lipoprotein(a): Secondary | ICD-10-CM

## 2024-11-03 DIAGNOSIS — E559 Vitamin D deficiency, unspecified: Secondary | ICD-10-CM | POA: Diagnosis not present

## 2024-11-03 DIAGNOSIS — Z8659 Personal history of other mental and behavioral disorders: Secondary | ICD-10-CM

## 2024-11-03 DIAGNOSIS — K7581 Nonalcoholic steatohepatitis (NASH): Secondary | ICD-10-CM | POA: Diagnosis not present

## 2024-11-03 DIAGNOSIS — R7303 Prediabetes: Secondary | ICD-10-CM | POA: Diagnosis not present

## 2024-11-03 DIAGNOSIS — Z6835 Body mass index (BMI) 35.0-35.9, adult: Secondary | ICD-10-CM

## 2024-11-03 DIAGNOSIS — F39 Unspecified mood [affective] disorder: Secondary | ICD-10-CM

## 2024-11-03 DIAGNOSIS — G479 Sleep disorder, unspecified: Secondary | ICD-10-CM

## 2024-11-03 DIAGNOSIS — E669 Obesity, unspecified: Secondary | ICD-10-CM | POA: Diagnosis not present

## 2024-11-03 NOTE — Progress Notes (Signed)
 "  Brandy Maynard, D.O.  ABFM, ABOM Clinical Bariatric Medicine Physician  Office located at: [address] [place], [place]  [address]    FOR THE CHRONIC DISEASE OF OBESITY:  BMI 35.0-35.9,adult-start BMI 35.5 Adult BMI 35.0-35.9 -current BMI 35.67  Chief complaint: Obesity Brandy Maynard is here to discuss her progress with her obesity treatment plan.   History of present illness / Interval history:  Brandy Maynard is here today for her first follow-up office visit since starting the program with us .  Since last OV, pt is up  1 lb. Reports it was difficult to hit the protein intake while staying within caloric intake.  On average she gets 86 grams of proteins and around 1300 calories per day.    10/14/24 08:00 11/03/24 11:00   Body Fat % 44.5 % 44.6 %  Muscle Mass (lbs) 110.8 lbs 111.2 lbs  Fat Mass (lbs) 93.6 lbs 94.2 lbs  Total Body Water (lbs) 80.6 lbs 81.2 lbs  Visceral Fat Rating  12 12  Counseling done today with patient on how various foods will affect these numbers and how to maximize fat loss success   Total lbs lost to date: +1 lbs Total Fat Mass in lbs lost to date: -0.2 Total weight loss percentage to date: +0.48 %   Nutrition Therapy She is on the Category 1 plan w/ lunch options or Journal 1100 calories with 90+ g protein  and states she is following her eating plan approximately 50 % of the time.   - Tracking Calories/Macros: yes  - Eating More Whole Foods: yes  - Adequate Protein Intake: no - ***  - Adequate Water Intake: no - ***  - Skipping Meals: yes  - Sleeping 7-9 Hours/ Night: no - ***  Luberta is currently in the action stage of change. As such, her goal is to continue weight management plan.  She has agreed to: continue current plan   Physical Activity IZAMAR LINDEN is circuit training or walking, 45 minutes, 3-4 days per wk currently  Zula has been advised to work up to 300-450 minutes of moderate intensity aerobic activity a week and  strengthening exercises 2-3 times per week for cardiovascular health, weight loss maintenance and preservation of muscle mass.  She has agreed to : Think about enjoyable ways to increase daily physical activity and overcoming barriers to exercise and Increase physical activity in their day and reduce sedentary time (increase NEAT).   Behavioral Modifications Evidence-based interventions for health behavior change were utilized today including the discussion of  1) self monitoring techniques:  journaling 2) problem-solving barriers:  personal stress, emotional eating 3) self care:  exercise 4) SMART goals for next OV:  Adhere to MP Regarding patient's less desirable eating habits and patterns, we employed the technique of small changes.   We discussed the following today: increasing lean protein intake to established goals, decreasing simple carbohydrates , increasing lower glycemic fruits, increasing water intake , work on tracking and journaling calories using tracking application, keeping healthy foods at home, practice mindfulness eating and understand the difference between hunger signals and cravings, work on managing stress, creating time for self-care and relaxation, and continue to work on implementation of reduced calorie nutritional plan Additional resources provided today: Handout on balanced plate concepts.  , Handout on practicing mindfulness around eating, Handout on Daily Food Journaling Log, Handout on Pre-Diabetes education , and Handout on insulin  resistance education   Medical Interventions/ Pharmacotherapy Previous Bariatric surgery: none Pharmacotherapy  for weight loss: She is currently taking Metformin  500 mg once daily for medical weight loss.    We discussed various medication options to help [name] with her weight loss efforts and we both agreed to : Continue with current nutritional and behavioral strategies   OBESITY RELATED CONDITIONS ADDRESSED TODAY: Extensive  review of labs performed today that were drawn at the new patient office visit on 10/14/2024.   Prediabetes Assessment & Plan Lab Results  Component Value Date   HGBA1C 6.1 (H) 10/14/2024   HGBA1C 6.0 08/28/2024   HGBA1C 6.2 09/18/2023   INSULIN  5.0 10/14/2024   Lab Results  Component Value Date   CREATININE 0.82 10/14/2024   BUN 15 10/14/2024   NA 141 10/14/2024   K 4.3 10/14/2024   CL 104 10/14/2024   CO2 22 10/14/2024   Lab Results  Component Value Date   WBC 6.5 10/14/2024   HGB 14.6 10/14/2024   HCT 44.7 10/14/2024   MCV 91 10/14/2024   PLT 266 10/14/2024   Lab Results  Component Value Date   TSH 2.160 10/14/2024   T3TOTAL 104 10/14/2024   FREET4 1.11 10/14/2024   Lab Results  Component Value Date   FOLATE 9.0 10/14/2024  Currently taking Metformin  500 mg once daily with good compliance and tolerance. Hunger and cravings are not well controlled, due to increased personal stress over the holidays. Reviewed labs with pt from 10/14/2024; A1c is not at goal at 6.1: optimal <5.7. Insulin  is well controlled. Provided counseling on the effects of carbs on insulin . Discussed different medications to help aid weight loss and decrease carb cravings. Will revisit in the future. Cont decreasing simple carbs and increasing lean proteins.   Kidney function, CBC, B9, TSH, T3, and T4 are WNL. No acute concerns. Cont low-sodium heart-healthy diet. Increase water intake and exercise as able.   - B12 level is 501, at goal of over 500.  This diagnosis was reviewed with the patient and education was provided.  Lab Results  Component Value Date   VITAMINB12 501 10/14/2024   - Continue prudent nutritional plan and focus on b12 rich foods such as lean red meats; poultry; eggs; seafood; beans, peas, and lentils; nuts and seeds; and soy products - We will continue to monitor as deemed clinically necessary. - Counseling provided today    Elevated lipoprotein(a) Assessment & Plan Lab  Results  Component Value Date   CHOL 176 10/14/2024   HDL 59 10/14/2024   LDLCALC 97 10/14/2024   TRIG 109 10/14/2024   CHOLHDL 3.0 10/14/2024  Managed by dietary and lifestyle interventions. In the past had elevated LDL, from labs on 10/14/2024, LDL was WNL. HDL and Trigs are WNL. No acute concerns. Cont following prudent nutritional meal plan and decreasing saturated/trans fats.     Metabolic dysfunction-associated steatohepatitis (MASH) Assessment & Plan    Component Value Date/Time   PROT 6.8 10/14/2024 1105   ALBUMIN 4.3 10/14/2024 1105   AST 17 10/14/2024 1105   ALT 10 10/14/2024 1105   ALKPHOS 134 10/14/2024 1105   BILITOT 0.4 10/14/2024 1105   BILIDIR 0.10 08/07/2022 0000   IBILI 0.1 02/05/2012 0848  Managed by dietary and lifestyle interventions. Liver enzymes are WNL. Has never had elevated AST or ALT. Her endocrinologist noticed her Alkphos was elevated, which led to liver ultrasound and a diagnosis of MASH. Cont to follow our prudent nutritional meal plan and decreasing simple sugars and fatty carbs. Cont to follow up with endocrinology and PCP as  needed.   Current visceral fat rating: 12.  The visceral fat rating should be < 12 in a female Visceral adipose tissue is a hormonally active component of total body fat. This body composition phenotype is associated with medical disorders such as metabolic syndrome, cardiovascular disease and several malignancies including prostate, breast, and colorectal cancers. Cont. losing 7-10% of weight via prudent nutritional plan and lifestyle changes.     Vitamin D  deficiency Assessment & Plan Lab Results  Component Value Date   VD25OH 77.4 10/14/2024   VD25OH 66.4 08/07/2022   VD25OH 69.97 02/27/2022  Currently taking OTC Vit D3 with K2 2000 units daily with good compliance  and tolerance. Vit D levels are at goal at 77.4. Desires to continue medication during the winter. No acute concerns today. Cont regimen. Will recheck 3-4  months.     Difficulty sleeping Assessment & Plan Reports increased stress during the holidays and personal stress. Her daughter's baby shower is coming up and has been stressed with planning, so she's consistently on her phone which can hinder adequate sleep. Discussed different options of blue light glasses. Encouraged to implement stress management strategies and obtain 7-9 hours of sleep per night.     h/o Mood disorder- with current emotional eating Assessment & Plan Pt reports that she eats when she's stressed to help comfort her and manage the stress. Reports she has an appointment scheduled with Dr. Sharron. Encouraged to practice mindful eating. Encouraged to implement stress-management strategies. Cont following up with Dr. Sharron. Will cont to monitor.      FOLLOW UP:   Return 11/17/2024 8:40 AM. She was informed of the importance of frequent follow up visits to maximize her success with intensive lifestyle modifications for her multiple health conditions.   Weight Summary and Biometrics   Weight Lost Since Last Visit: 0lb  Weight Gained Since Last Visit: 1lb   Vitals Temp: 98.1 F (36.7 C) BP: 110/73 Pulse Rate: 65 SpO2: 97 %   Anthropometric Measurements Height: 5' 4.5 (1.638 m) Weight: 211 lb (95.7 kg) BMI (Calculated): 35.67 Weight at Last Visit: 210lb Weight Lost Since Last Visit: 0lb Weight Gained Since Last Visit: 1lb Starting Weight: 210lb Total Weight Loss (lbs): 0 lb (0 kg) Peak Weight: 260lb   Body Composition  Body Fat %: 44.6 % Fat Mass (lbs): 94.2 lbs Muscle Mass (lbs): 111.2 lbs Total Body Water (lbs): 81.2 lbs Visceral Fat Rating : 12   Other Clinical Data Fasting: no Labs: no Today's Visit #: 2 Starting Date: 10/14/24     Objective:  Review of Systems:  Pertinent positives were addressed with patient today.   Reviewed by clinician on day of visit: allergies, medications, problem list, medical history, surgical history,  family history, social history, and previous encounter notes.  PHYSICAL EXAM: Blood pressure 110/73, pulse 65, temperature 98.1 F (36.7 C), height 5' 4.5 (1.638 m), weight 211 lb (95.7 kg), last menstrual period 10/12/2022, SpO2 97%. Body mass index is 35.66 kg/m. General: she is overweight, cooperative and in no acute distress.   HEENT: EOMI, sclerae are anicteric. Lungs: Normal breathing effort, no conversational dyspnea. M-Sk:  Normal gross ROM * 4 extremities  PSYCH: Has normal mood, affect and thought process. Neurologic: No gross sensory or motor deficits. Well developed, A and O * 3  DIAGNOSTIC DATA REVIEWED:  BMET    Component Value Date/Time   NA 141 10/14/2024 1105   K 4.3 10/14/2024 1105   CL 104 10/14/2024 1105   CO2 22  10/14/2024 1105   GLUCOSE 98 10/14/2024 1105   GLUCOSE 101 (H) 07/28/2024 0900   BUN 15 10/14/2024 1105   CREATININE 0.82 10/14/2024 1105   CREATININE 0.78 02/05/2012 0848   CALCIUM 9.8 10/14/2024 1105   GFRNONAA >60 07/28/2024 0900   GFRAA 89 05/17/2020 0000   Lab Results  Component Value Date   HGBA1C 6.1 (H) 10/14/2024   HGBA1C 6.2 03/10/2010   Lab Results  Component Value Date   INSULIN  5.0 10/14/2024   Lab Results  Component Value Date   TSH 2.160 10/14/2024   CBC    Component Value Date/Time   WBC 6.5 10/14/2024 1105   WBC 7.6 09/18/2023 1239   RBC 4.90 10/14/2024 1105   RBC 4.78 09/18/2023 1239   HGB 14.6 10/14/2024 1105   HCT 44.7 10/14/2024 1105   PLT 266 10/14/2024 1105   MCV 91 10/14/2024 1105   MCH 29.8 10/14/2024 1105   MCH 29.6 06/13/2015 2132   MCHC 32.7 10/14/2024 1105   MCHC 32.9 09/18/2023 1239   RDW 12.0 10/14/2024 1105   Iron Studies    Component Value Date/Time   IRON 79 05/17/2020 0000   TIBC 336 08/29/2017 1123   FERRITIN 37 05/17/2020 0000   IRONPCTSAT 46 08/29/2017 1123   Lipid Panel     Component Value Date/Time   CHOL 176 10/14/2024 1105   TRIG 109 10/14/2024 1105   HDL 59 10/14/2024  1105   CHOLHDL 3.0 10/14/2024 1105   CHOLHDL 4 09/18/2023 1239   VLDL 24.2 09/18/2023 1239   LDLCALC 97 10/14/2024 1105   Hepatic Function Panel     Component Value Date/Time   PROT 6.8 10/14/2024 1105   ALBUMIN 4.3 10/14/2024 1105   AST 17 10/14/2024 1105   ALT 10 10/14/2024 1105   ALKPHOS 134 10/14/2024 1105   BILITOT 0.4 10/14/2024 1105   BILIDIR 0.10 08/07/2022 0000   IBILI 0.1 02/05/2012 0848      Component Value Date/Time   TSH 2.160 10/14/2024 1105   Nutritional Lab Results  Component Value Date   VD25OH 77.4 10/14/2024   VD25OH 66.4 08/07/2022   VD25OH 69.97 02/27/2022     Attestations:   I, Feliciano Mingle, acting as a stage manager for Brandy Jenkins, DO., have compiled all relevant documentation for today's office visit on behalf of Brandy Jenkins, DO, while in the presence of [employer], DO.  I have spent 65 minutes in the care of the patient today.  55 minutes was spent on face-to-face counseling and reviewing listed medical problems above as outlined in office visit note, providing nutritional and behavioral counseling as outlined in obesity care plan, independently interpreting results and goals of care, see listed medical problems, and discussing biometric information and progress. We reviewed her meal plan and discussed how the foods she's eating is affecting each one of her labs. Pt educated on why we want her to eat various foods in various amounts and has a better understanding of the nutritional plan because of this. All her questions were answered today. 10 minutes was spent on pre-chart review and additional documentation after the visit.   I have reviewed the above documentation for accuracy and completeness, and I agree with the above. Brandy JINNY Maynard, D.O.  The 21st Century Cures Act was signed into law in 2016 which includes the topic of electronic health records.  This provides immediate access to information in MyChart.  This includes  consultation notes, operative notes, office notes, lab results and  pathology reports.  If you have any questions about what you read please let us  know at your next visit so we can discuss your concerns and take corrective action if need be.  We are right here with you. "

## 2024-11-17 ENCOUNTER — Encounter (INDEPENDENT_AMBULATORY_CARE_PROVIDER_SITE_OTHER): Payer: Self-pay | Admitting: Family Medicine

## 2024-11-17 ENCOUNTER — Ambulatory Visit (INDEPENDENT_AMBULATORY_CARE_PROVIDER_SITE_OTHER): Admitting: Family Medicine

## 2024-11-17 VITALS — BP 120/77 | HR 74 | Temp 98.3°F | Ht 64.5 in | Wt 208.0 lb

## 2024-11-17 DIAGNOSIS — Z6835 Body mass index (BMI) 35.0-35.9, adult: Secondary | ICD-10-CM | POA: Diagnosis not present

## 2024-11-17 DIAGNOSIS — F5089 Other specified eating disorder: Secondary | ICD-10-CM | POA: Diagnosis not present

## 2024-11-17 DIAGNOSIS — E669 Obesity, unspecified: Secondary | ICD-10-CM

## 2024-11-17 DIAGNOSIS — R7303 Prediabetes: Secondary | ICD-10-CM

## 2024-11-17 DIAGNOSIS — F39 Unspecified mood [affective] disorder: Secondary | ICD-10-CM

## 2024-11-17 MED ORDER — BUPROPION HCL ER (XL) 150 MG PO TB24: 150.0000 mg | ORAL_TABLET | ORAL | 0 refills | Status: AC

## 2024-11-17 NOTE — Progress Notes (Signed)
 "            Brandy Maynard, D.O.  ABFM, ABOM Specializing in Clinical Bariatric Medicine  Office located at: [address] [place], [place]  [address]         Medications Discontinued During This Encounter  Medication Reason   metFORMIN  (GLUCOPHAGE ) 500 MG tablet      Meds ordered this encounter  Medications   buPROPion  (WELLBUTRIN  XL) 150 MG 24 hr tablet    Sig: Take 1 tablet (150 mg total) by mouth every morning.    Dispense:  90 tablet    Refill:  0      A) FOR THE CHRONIC DISEASE OF OBESITY: BMI 35.0-35.9,adult-start BMI 35.5 Adult BMI 35.0-35.9 -current BMI 35.16  Chief complaint: Obesity Brandy Maynard is here to discuss Brandy Maynard progress with Brandy Maynard obesity treatment plan.   History of present illness / Interval history:  Brandy Maynard is here today for Brandy Maynard follow-up office visit.  Since last OV on 11/03/2024 with provider: Dr. Jenkins, patient is down 3 lbs.   excellent adherence to reduced calorie nutritional plan.  Pt has been struggling with:  []  meal planning and prepping []  exercise [x]  sleep []  stressors []  mood []  chronic or acute medical conditions-  []  eating out more [x]  Emotional eating, water intake  Pt has been working on and improving their:  []  meal planning and prepping [x]  exercise []  Sleep []  water intake []  strategies to better manage personal stressors []  strategies to better manage mood []  eating out less    When asked by CMA prior to our OV, pt states they have been:  - Focused on eating fresh, unprocessed foods?  Yes   - Focused on eating lean proteins with each meal?  Yes   - Sleeping 7-9 Hours/ Night?  No - Skipping Meals?  Yes   - States Brandy Maynard is following Brandy Maynard healthy eating plan approximately 85 % of the time.   Recent weight loss data history  11/03/24 11:00 11/17/24 08:00   Body Fat % 44.6 % 43.4 %  Muscle Mass (lbs) 111.2 lbs 112.2 lbs  Fat Mass (lbs) 94.2 lbs 90.6 lbs  Total Body Water (lbs) 81.2 lbs 79.2 lbs   Visceral Fat Rating  12 12   Counseling done on how various foods/ behaviors will affect these numbers and how to maximize weight loss success discussed today in detail based on these findings.  Total lbs lost to date: -2 lbs Total Fat Mass in lbs lost to date: -3.8 lbs Total weight loss percentage to date since starting program:  -0.95 %   Physician directed Nutrition Therapy prescription: Brandy Maynard is on Brandy Maynard is on the Category 1 plan w/ lunch options or Journal 1100 calories with 90+ g protein.     Brandy Maynard is currently in the action stage of change. As such, Brandy Maynard goal is to continue and/ or modify their weight management treatment plan.  Brandy Maynard has agreed to: continue current plan   Physician directed Behavioral Modification prescription: Evidence-based interventions for healthy behavior change were utilized today including the discussion of small changes and SMART goals.   SPECIFIC SMART goals for next OV:  Increase protein and water intake  Regarding patient's less desirable eating habits and patterns, we employed the technique of small changes.  For next OV, pt will focus on increasing lean proteins, practice mindful eating, increasing water intake, and incorporating healthy fats into diet.  We discussed the following today: increasing lean protein intake  to established goals, avoiding skipping meals, continue to work on implementation of reduced calorie nutritional plan, and continue to practice mindfulness when eating  Additional resources provided today: Handout on Daily Food Journaling Log   Physician directed Physical Activity prescription: Pt is doing bootcamp 45 minutes 3 days per week and walking 2-3 miles 2 days per week.   barriers to successful adherence to exercise for wt loss:  Brandy Maynard has a surgery coming up for sub-q cyst and will not be able to lift weights.  Brandy Maynard has been advised continue adequate protein intake to help sustain muscle    Brandy Maynard has agreed to : Continue  current level of physical activity , Think about enjoyable ways to increase daily physical activity and overcoming barriers to exercise, Continue to gradually increase the amount and intensity of exercise routine, and Combine aerobic and strengthening exercises for efficiency and improved cardiometabolic health.    Medical Interventions/ Pharmacotherapy Previous Bariatric surgery: none   Previously tried medical weight loss medications/ therapies:   Phentermine  (Adipex)  Pharmacotherapy for weight loss: Brandy Maynard is currently taking Metformin - XR 500 mg once daily  for medical weight loss.  We had an extensive discussion about different weight loss medications, including Contrave and Qsymia. Reviewed individual medication components with pt. As patient does not have coverage for combination weight loss medications, plan is to initiate individual drugs and adjust treatment as necessary.    We discussed various medication options to help Brandy Maynard with Brandy Maynard weight loss efforts and we both agreed to:  Continue with current nutritional and behavioral strategies and Start Wellbutrin  XL 150 mg once daily     B) OBESITY RELATED CONDITIONS ADDRESSED TODAY:    Prediabetes Assessment & Plan Lab Results  Component Value Date   HGBA1C 6.1 (H) 10/14/2024   HGBA1C 6.0 08/28/2024   HGBA1C 6.2 09/18/2023   INSULIN  5.0 10/14/2024  Currently on Metformin  once daily with good compliance and tolerance. Reports increased food noise. Pt reports Brandy Maynard takes  medication at night, because Brandy Maynard notices less GI upset. I recommend taking medication with lunch to help manage cravings and reduce food noise. Most recent A1c on 10/14/2024, is at  6.1; optimal <5.7. Cont decreasing simple carbs and increasing lean proteins. Cont adequate exercise.     h/o Mood disorder- with current emotional eating Assessment & Plan Pt has a h/o of emotional eating. Reports Brandy Maynard eats when stressed. Has an appt scheduled with Dr. Sharron for CBT.   R/B meds d/c pt and mutual shared decision made to Start Wellbutrin  XL 150 mg once daily to help manage stress. Cont to f/u with Dr. Sharron. Implement stress management strategies and practice mindful eating.  Medications Discontinued During This Encounter  Medication Reason   metFORMIN  (GLUCOPHAGE ) 500 MG tablet    Meds ordered this encounter  Medications   buPROPion  (WELLBUTRIN  XL) 150 MG 24 hr tablet    Sig: Take 1 tablet (150 mg total) by mouth every morning.    Dispense:  90 tablet    Refill:  0      Follow up:   Return 12/01/2024 10:00 AM.  Brandy Maynard was informed of the importance of frequent follow up visits to maximize Brandy Maynard success with intensive lifestyle modifications for Brandy Maynard multiple health conditions.    Weight Summary and Biometrics   Weight Lost Since Last Visit: 3 lb  Weight Gained Since Last Visit: 0    Vitals Temp: 98.3 F (36.8 C) BP: 120/77 Pulse Rate: 74 SpO2: 96 %  Anthropometric Measurements Height: 5' 4.5 (1.638 m) Weight: 208 lb (94.3 kg) BMI (Calculated): 35.16 Weight at Last Visit: 211 lb Weight Lost Since Last Visit: 3 lb Weight Gained Since Last Visit: 0 Starting Weight: 210 lb Total Weight Loss (lbs): 2 lb (0.907 kg) Peak Weight: 260 lb Waist Measurement : 41.5 inches   Body Composition  Body Fat %: 43.4 % Fat Mass (lbs): 90.6 lbs Muscle Mass (lbs): 112.2 lbs Total Body Water (lbs): 79.2 lbs Visceral Fat Rating : 12   Other Clinical Data Fasting: no Labs: no Today's Visit #: 3 Starting Date: 10/14/24    Objective:   PHYSICAL EXAM: Blood pressure 120/77, pulse 74, temperature 98.3 F (36.8 C), height 5' 4.5 (1.638 m), weight 208 lb (94.3 kg), last menstrual period 10/12/2022, SpO2 96%. Body mass index is 35.15 kg/m. General: Brandy Maynard is overweight, cooperative and in no acute distress. PSYCH: Has normal mood, affect and thought process.   HEENT: EOMI, sclerae are anicteric. Lungs: Normal breathing effort, no conversational  dyspnea. Extremities: Moves * 4 Neurologic: A and O * 3, good insight  DIAGNOSTIC DATA REVIEWED: BMET    Component Value Date/Time   NA 141 10/14/2024 1105   K 4.3 10/14/2024 1105   CL 104 10/14/2024 1105   CO2 22 10/14/2024 1105   GLUCOSE 98 10/14/2024 1105   GLUCOSE 101 (H) 07/28/2024 0900   BUN 15 10/14/2024 1105   CREATININE 0.82 10/14/2024 1105   CREATININE 0.78 02/05/2012 0848   CALCIUM 9.8 10/14/2024 1105   GFRNONAA >60 07/28/2024 0900   GFRAA 89 05/17/2020 0000   Lab Results  Component Value Date   HGBA1C 6.1 (H) 10/14/2024   HGBA1C 6.2 03/10/2010   Lab Results  Component Value Date   INSULIN  5.0 10/14/2024   Lab Results  Component Value Date   TSH 2.160 10/14/2024   CBC    Component Value Date/Time   WBC 6.5 10/14/2024 1105   WBC 7.6 09/18/2023 1239   RBC 4.90 10/14/2024 1105   RBC 4.78 09/18/2023 1239   HGB 14.6 10/14/2024 1105   HCT 44.7 10/14/2024 1105   PLT 266 10/14/2024 1105   MCV 91 10/14/2024 1105   MCH 29.8 10/14/2024 1105   MCH 29.6 06/13/2015 2132   MCHC 32.7 10/14/2024 1105   MCHC 32.9 09/18/2023 1239   RDW 12.0 10/14/2024 1105   Iron Studies    Component Value Date/Time   IRON 79 05/17/2020 0000   TIBC 336 08/29/2017 1123   FERRITIN 37 05/17/2020 0000   IRONPCTSAT 46 08/29/2017 1123   Lipid Panel     Component Value Date/Time   CHOL 176 10/14/2024 1105   TRIG 109 10/14/2024 1105   HDL 59 10/14/2024 1105   CHOLHDL 3.0 10/14/2024 1105   CHOLHDL 4 09/18/2023 1239   VLDL 24.2 09/18/2023 1239   LDLCALC 97 10/14/2024 1105   Hepatic Function Panel     Component Value Date/Time   PROT 6.8 10/14/2024 1105   ALBUMIN 4.3 10/14/2024 1105   AST 17 10/14/2024 1105   ALT 10 10/14/2024 1105   ALKPHOS 134 10/14/2024 1105   BILITOT 0.4 10/14/2024 1105   BILIDIR 0.10 08/07/2022 0000   IBILI 0.1 02/05/2012 0848      Component Value Date/Time   TSH 2.160 10/14/2024 1105   Nutritional Lab Results  Component Value Date   VD25OH  77.4 10/14/2024   VD25OH 66.4 08/07/2022   VD25OH 69.97 02/27/2022    Attestations:   I, Feliciano Mingle, acting as  a medical scribe for Brandy Jenkins, DO., have compiled all relevant documentation for today's office visit on behalf of Brandy Jenkins, DO, while in the presence of [employer], DO.  Reviewed by clinician on day of visit: allergies, medications, problem list, medical history, surgical history, family history, social history, and previous encounter notes. I have spent 43 minutes in the care of the patient including:   3 minutes before the visit reviewing and preparing the chart.   30 minutes face-to-face assessing and reviewing listed medical problems as outlined in obesity care plan, providing nutritional and behavioral counseling on topics outlined in the obesity care plan, independently interpreting test results and goals of care as described in assessment and plan, and reviewing and discussing biometric information and progress with obesity treatment plan  10 minutes after the visit updating chart and documentation of encounter.   I have reviewed the above documentation for accuracy and completeness, and I agree with the above. Brandy JINNY Maynard, D.O.  The 21st Century Cures Act was signed into law in 2016 which includes the topic of electronic health records.  This provides immediate access to information in MyChart.  This includes consultation notes, operative notes, office notes, lab results and pathology reports.  If you have any questions about what you read please let us  know at your next visit so we can discuss your concerns and take corrective action if need be.  We are right here with you.  "

## 2024-11-18 ENCOUNTER — Ambulatory Visit: Admitting: Psychology

## 2024-11-18 DIAGNOSIS — F411 Generalized anxiety disorder: Secondary | ICD-10-CM | POA: Diagnosis not present

## 2024-11-18 DIAGNOSIS — F5089 Other specified eating disorder: Secondary | ICD-10-CM | POA: Diagnosis not present

## 2024-11-18 NOTE — Progress Notes (Signed)
 "                                                                Main Office Phone: [phone] Fax: [phone]   Date: November 18, 2024  Name: Brandy Maynard MRN: [id number] Appointment Start Time: 9:01am Duration: 63 minutes Type of Session: Intake for Individual Therapy  Type of Contact: In Person  Informed Consent: The provider's role was explained to [employer]. The provider reviewed and discussed issues of confidentiality, privacy, and limits therein (e.g., reporting obligations). In addition to verbal informed consent, written informed consent for psychological services was obtained prior to the initial appointment. Since the clinic is not a 24/7 crisis center, mental health emergency resources were shared and this  provider explained MyChart, e-mail, voicemail, and/or other messaging systems should be utilized only for non-emergency reasons. This provider also explained that information obtained during appointments will be securely placed in Brandy Maynard's medical record. Kerri-Anne verbally consented to proceed. All questions/concerns related to new patient paperwork were addressed.   Chief Complaint/HPI: Brandy Maynard was referred by Dr. Midge due to H/o Mood disorder- with current emotional eating . The note for the OV with Dr. Midge dated 10/24/2024 indicated the following: Her PHQ-9 score was 3 today. Making life not difficult at all. Denies any SI/HI. Patient states that she eats when she is stressed,sad,bored,angry, when feeling guilty, to help comfort yourself and as a reward. Patients mother was anorexic. She states that she felt judged about her weight since she was younger. Explained to patient about the importance of seeing someone who can help talk about strategies with emotional eating and the feeling around food. Referral was sent to Dr.Cove Haydon. Patient will make an appointment with her in the future. Begin building strategies with specialist. Will follow alongside  specialist.  During today's appointment, [name] reported she is an emotional and stress eater. She further shared she experiences challenges with being bored. [name] disclosed her mother was an anorexic and believes that impacted her relationship with food. [name] was verbally administered a questionnaire assessing various behaviors related to emotional eating behaviors. [name] endorsed the following: overeat when you are celebrating, experience food cravings on a regular basis, eat certain foods when you are anxious, stressed, depressed, or your feelings are hurt, use food to help you cope with emotional situations, find food is comforting to you, overeat when you are angry or upset, overeat when you are worried about something, overeat frequently when you are bored or lonely, not worry about what you eat when you are in a good mood, overeat when you are angry at someone just to show them they cannot control you, overeat when you are alone, but eat much less when you are with other people, and eat as a reward. [name] believes the onset of emotional eating behaviors was likely as an adult after her first pregnancy (~ age 22) and described the current frequency of emotional eating behaviors as at least twice a week. In addition, [name] denied a history of binge eating behaviors. She denied engagement in any other disordered eating behaviors. She indicated she has never been diagnosed with an eating disorder. Moreover, Brandy Maynard stated instances of hiding certain foods due to shame.   Furthermore, [name] shared she has a challenging relationship with  her older daughter as she is reportedly blaming Brandy Maynard for being a terrible mother. She explained that her younger daughter does not agree with the aforementioned; however, Brandy Maynard indicated it is depressing. Brandy Maynard further shared experiencing sleep issues, noting she takes a magnesium drink and a sleep aid. She explained she does not have any issues falling asleep, but has  challenges going back to sleep as she often wakes up around 3am and will think about her to do list. She indicated, I feel like I have to keep busy. She noted a belief being idle may lead to overeating. Brandy Maynard described current anxiety as an internal dialogue of having a conversation of how things are going to go. She described it as generalized and occurs more days than not. Brandy Maynard further reflected she is a take charge person and continuously stays busy. She described herself as a person who has control issues.   Mental Status Examination:  Appearance: neat Behavior: appropriate to circumstances Mood: neutral Affect: mood congruent; tearful when discussing hx Speech: WNL Eye Contact: appropriate Psychomotor Activity: WNL Gait: normal Thought Process: linear, logical, and goal directed and denies suicidal, homicidal, and self-harm ideation, plan and intent  Thought Content/Perception: no hallucinations, delusions, bizarre thinking or behavior endorsed or observed Orientation: AAOx4 Memory/Concentration: intact Insight/Judgment: fair  Family & Psychosocial History: Brandy Maynard reported she is married and has two adult daughters, adding her first grandchild will be born in March. 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 Additionally, Brandy Maynard shared her highest level of education obtained is two years of college.  Brandy Maynard stated she resides with her husband and youngest daughter; however, her daughter is not home often due to a pet sitting business. Currently, Brandy Maynard's social support system consists of her great friends, husband, and youngest daughter. Moreover, she indicated that she identifies with the following: Christianity. She further shared she is involved with her church. The following hobbies were noted: being outdoors and reading.   Medical History:  Past Medical History:  Diagnosis Date   Allergy    ANEMIA 12/15/2009    Qualifier: Diagnosis of  By: Charlett MD, Apolinar POUR    Anxiety    Arthritis    Back pain    Constipation    Depression    Fatty liver    Gallbladder problem    GERD (gastroesophageal reflux disease)    Heavy menses    on ocps   High cholesterol    IBS (irritable bowel syndrome)    by hx and had a neg colonscopy   Infertility, female    Joint pain    Migraines    Osteoarthritis    Pre-diabetes     Past Surgical History:  Procedure Laterality Date   ARTHROTOMY Left 07/31/2024   Procedure: ARTHROTOMY;  Surgeon: Murrell Drivers, MD;  Location: [facility];  Service: Orthopedics;  Laterality: Left;   CHOLECYSTECTOMY     COLONOSCOPY  08/03/2005   COLONOSCOPY W/ BIOPSIES  Nov 12 2015   CYST REMOVAL HAND Left 07/31/2024   Procedure: REMOVAL, CYST, HAND;  Surgeon: Murrell Drivers, MD;  Location: [facility];  Service: Orthopedics;  Laterality: Left;  LEFT LONG FINGER EXCISION MUCOID CYST AND DEBRIDEMENT DISTAL INTERPHALANGEAL JOINT, POSSIBLE ROTATION FLAP   ESOPHAGOGASTRODUODENOSCOPY ENDOSCOPY  Nov 12 2015   FOOT SURGERY     KNEE ARTHROSCOPY  2012   right knee,,, left knee at 15   LIGAMENT REPAIR  2002   Rt  ankle   PELVIC LAPAROSCOPY  10/11/2005   LAPAROTOMY, LYSIS OF PELVIC ADHESIONS, RSO AND CAUTERIZATION AND TRANSECTION OF LEFT FALLOPIAN TUBE   PLICA, arthritis and cartilage repair  01/11/2011   on Rt knee   TUBAL LIGATION  09/2005   scar tissue removal ovarian cyst    Current Outpatient Medications  Medication Sig Dispense Refill   buPROPion  (WELLBUTRIN  XL) 150 MG 24 hr tablet Take 1 tablet (150 mg total) by mouth every morning. 90 tablet 0   cetirizine (ZYRTEC) 10 MG tablet Take 10 mg by mouth daily.     Cholecalciferol 50 MCG (2000 UT) CAPS Vitamin d3  + k2     Continuous Glucose Sensor (FREESTYLE LIBRE 3 PLUS SENSOR) MISC USE AS DIRECTED. CHANGE EVERY 15 DAYS     estradiol  (VIVELLE -DOT) 0.025 MG/24HR Place 1 patch onto the skin 2 (two) times a week. 24  patch 3   fluticasone  (FLONASE ) 50 MCG/ACT nasal spray Place into both nostrils as needed for allergies or rhinitis.     ibuprofen  (ADVIL ) 600 MG tablet Take 1 tablet (600 mg total) by mouth every 6 (six) hours as needed (for pain). (Patient not taking: Reported on 11/17/2024) 60 tablet 0   metFORMIN  (GLUCOPHAGE -XR) 500 MG 24 hr tablet Take 500 mg by mouth at bedtime.     mupirocin  ointment (BACTROBAN ) 2 % Apply 1 Application topically 2 (two) times daily. (Patient not taking: Reported on 11/17/2024) 15 g 0   Probiotic Product (PROBIOTIC DAILY PO) Take by mouth.     progesterone  (PROMETRIUM ) 100 MG capsule Take 1 capsule (100 mg total) by mouth daily. 90 capsule 3   traMADol  (ULTRAM ) 50 MG tablet Take 1 tablet (50 mg total) by mouth every 6 (six) hours as needed. (Patient not taking: Reported on 11/17/2024) 15 tablet 0   TURMERIC CURCUMIN PO      TURMERIC PO Take by mouth.     UNABLE TO FIND Med Name: relaxium sleep     No current facility-administered medications for this visit.    Allergies[1]  Mental Health History: September reported a history of Paxil years ago resulting in weight gain. She was prescribed Wellbutrin  yesterday by Dr. Midge for cravings and mood. She further reported at history of attending therapeutic services around 2018-2020, noting she ceased services due to the pandemic. Hollie recalled she sought services at the time due to issues with her daughters and other interpersonal conflicts. Stefania reported there is no history of hospitalizations for psychiatric concerns. Alyssha endorsed a family history of mental health/substance abuse related concerns, including anorexia (mother), anxiety (mother, maternal grandmother), depression (mother, maternal grandmother), and PTSD (youngest daughter). Furthermore, Donnae reported there is no history of trauma including psychological, physical , and sexual abuse, as well as neglect. However, she shared about her youngest daughter's experiences in 2018,  including a MVA which she described as traumatic for the whole family.   Darin endorsed current alcohol use (1-2xs a month in the form of 1-2 standard drinks). She denied tobacco use. She denied illicit/recreational substance use. She endorsed the following caffeine use: one cup of coffee (10 oz) daily and 1-2 glass of tea a couple times a week. She endorsed a hx of panic attacks, noting the first time was in 2005/2006 when her grandfather was passing. She explained, It happens under extreme stress while driving [place] [address]. She explained she will pull off the road due to the difficulty breathing and prays. The last time she experienced a panic attack was early  last year. Furthermore, Shawndra indicated she is not experiencing the following: hallucinations and delusions, paranoia, and symptoms of mania . She also denied history of and current suicidal ideation, plan, and intent; history of and current homicidal ideation, plan, and intent; and history of and current engagement in self-harm. While future psychiatric events cannot be accurately predicted, the patient does not currently require acute inpatient psychiatric care and does not currently meet [place]  involuntary commitment criteria.  Vesta endorsed the following coping strategies: reading Bible, spending time in prayer, exercising, and calling friends. She identified the following strengths: Supportive relationships, family, friends, church, desire to serve, and desire to help people. The following barriers were observed/identified: hand surgery.   Legal History: Hildegarde reported there is no history of legal involvement.   Structured Assessments Results: The Patient Health Questionnaire-9 (PHQ-9) is a self-report measure that assesses symptoms and severity of depression over the course of the last two weeks. Montgomery obtained a score of 6 suggesting mild depression. Aizza finds the endorsed symptoms to be not difficult at all. [0= Not at all; 1= Several  days; 2= More than half the days; 3= Nearly every day] Little interest or pleasure in doing things 0  Feeling down, depressed, or hopeless 0  Trouble falling or staying asleep, or sleeping too much 2  Feeling tired or having little energy 2  Poor appetite or overeating 1  Feeling bad about yourself --- or that you are a failure or have let yourself or your family down 1  Trouble concentrating on things, such as reading the newspaper or watching television 0  Moving or speaking so slowly that other people could have noticed? Or the opposite --- being so fidgety or restless that you have been moving around a lot more than usual 0  Thoughts that you would be better off dead or hurting yourself in some way 0  PHQ-9 Score 6    The Generalized Anxiety Disorder-7 (GAD-7) is a brief self-report measure that assesses symptoms of anxiety over the course of the last two weeks. Lillyanna obtained a score of 8 suggesting mild anxiety. Zenya finds the endorsed symptoms to be somewhat difficult. [0= Not at all; 1= Several days; 2= Over half the days; 3= Nearly every day] Feeling nervous, anxious, on edge 1  Not being able to stop or control worrying 0  Worrying too much about different things 1  Trouble relaxing 3  Being so restless that it's hard to sit still 2  Becoming easily annoyed or irritable 1  Feeling afraid as if something awful might happen 0  GAD-7 Score 8   Interventions:  Conducted a chart review Focused on rapport building Verbally administered PHQ-9 and GAD-7 for symptom monitoring Verbally administered Food & Mood questionnaire to assess various behaviors related to emotional eating Provided emphatic reflections and validation Conducted a clinical interview  Provisional DSM-5 Diagnosis(es): Zariya reported a history of engaging in emotional eating behaviors noting onset in her mid-20s. She described the current frequency of emotional eating behaviors as at least twice a week.. She denied  engagement in any other disordered eating behaviors. Moreover, Jazmina discussed experiencing anxiety-related symptomatology during the clinical interview and endorsed items on the GAD-7. She described herself as someone who has challenges with control and that worry about her to-do list often impacts her sleep and ability to relax. She described it as generalized and occurring more days than not. Based on the aforementioned, the following was assigned: F50.89 Other Specified Feeding or Eating  Disorder, Emotional Eating Behaviors and F41.1 Generalized Anxiety Disorder.  Plan: Elizette appears able and willing to participate as evidenced engagement in reciprocal conversation and asking questions as needed for clarification. The next appointment is scheduled for 12/02/2024 at 9am, which will be via Caregility. For homework, Rosey was encouraged to reflect on goal(s) for treatment, as a treatment plan will be developed at the next appointment. This provider will regularly review the treatment plan and medical chart to keep informed of status changes.    Wyatt SHAUNNA Fire, PsyD       [1]  Allergies Allergen Reactions   Junel Fe 1-20 [Norethin  Ace-Eth Estrad-Fe] Other (See Comments)     migraine  With visual aura ;elevated blood pressures   Penicillins     Hives. Eyes swell shut.    Vicodin [Hydrocodone -Acetaminophen ] Itching   "

## 2024-12-01 ENCOUNTER — Ambulatory Visit (INDEPENDENT_AMBULATORY_CARE_PROVIDER_SITE_OTHER): Admitting: Family Medicine

## 2024-12-02 ENCOUNTER — Ambulatory Visit: Admitting: Psychology

## 2024-12-02 DIAGNOSIS — F411 Generalized anxiety disorder: Secondary | ICD-10-CM

## 2024-12-02 DIAGNOSIS — F5089 Other specified eating disorder: Secondary | ICD-10-CM

## 2024-12-02 NOTE — Progress Notes (Signed)
" ° ° °                                   Main Office Phone: [phone] Fax: [phone]  Date: December 02, 2024  Name: Brandy Maynard MRN: [id number] Appointment Start Time: 9:00am Duration: 60 minutes Type of Session: Individual Therapy  Location of Patient: Home Location of Provider: Provider's Home (private office) Type of Contact: Telepsychological Visit via Caregility (video and audio capabilities)  Session Content: Today's appointment was a telepsychological visit. [name] provided verbal consent for today's telepsychological appointment and she is aware she is responsible for securing confidentiality on her end of the session. Prior to proceeding with today's appointment, [name] physical location at the time of this appointment was obtained as well a phone number she could be reached at in the event of technical difficulties. [name] participated in today's telepsychological service.   This provider conducted a brief check-in and established an agenda for today's appointment. [name] shared she recently had surgery and is recovering. [name] and this provider collaborated on developing a treatment plan. Discussed diagnoses. [name] provided verbal approval of this treatment plan and provided verbal consent to proceed with services. This treatment plan will be signed by [name] after this appointment. Remainder of today's appointment focused on current eating-related concerns and journey with [facility]. She stated she is consistently journaling her food intake, noting good, ugly or otherwise. Given history of emotional eating behaviors, psychoeducation provided regarding emotional and physical hunger. [name] provided verbal consent during today's appointment for this provider to send handout(s) via e-mail. Overall, [name] was receptive to today's appointment as evidenced by openness to sharing and responsiveness to feedback.  Mental Status Examination:  Appearance:  neat Behavior: appropriate to circumstances Mood: neutral Affect: mood congruent Speech: WNL Eye Contact: appropriate Psychomotor Activity: WNL Gait: unable to assess Thought Process: linear, logical, and goal directed and no evidence or endorsement of suicidal, homicidal, and self-harm ideation, plan and intent  Thought Content/Perception: no hallucinations, delusions, bizarre thinking or behavior endorsed or observed Orientation: AAOx4 Memory/Concentration: intact Insight/Judgment: fair  Interventions:  Conducted a brief chart review Provided empathic reflections and validation Processed thoughts and feelings Psychoeducation provided regarding physical versus emotional hunger Employed supportive psychotherapy interventions to facilitate reduced distress, and to improve coping skills with identified stressors Developed a treatment plan  DSM-5 Diagnosis(es): F50.89 Other Specified Feeding or Eating Disorder, Emotional Eating Behaviors and F41.1 Generalized Anxiety Disorder  Treatment Goal(s) & Progress: Progress is limited as treatment was recently initiated, rapport is being established, and the treatment plan was developed during today's appointment. Treatment plan is attached to this encounter.  Plan: The next appointment is scheduled for 12/22/2024, which will be in-person. The next session will focus on reviewing hunger types and discussing triggers for emotional eating behaviors. [name] will be sent the treatment plan signature page electronically, which will be signed and returned prior to the next appointment.     Wyatt SHAUNNA Fire, PsyD "

## 2024-12-10 ENCOUNTER — Ambulatory Visit (INDEPENDENT_AMBULATORY_CARE_PROVIDER_SITE_OTHER): Admitting: Family Medicine

## 2024-12-18 ENCOUNTER — Ambulatory Visit (INDEPENDENT_AMBULATORY_CARE_PROVIDER_SITE_OTHER): Admitting: Family Medicine

## 2024-12-22 ENCOUNTER — Ambulatory Visit: Admitting: Psychology

## 2025-01-06 ENCOUNTER — Ambulatory Visit (INDEPENDENT_AMBULATORY_CARE_PROVIDER_SITE_OTHER): Admitting: Family Medicine

## 2025-01-14 ENCOUNTER — Ambulatory Visit (INDEPENDENT_AMBULATORY_CARE_PROVIDER_SITE_OTHER): Admitting: Family Medicine

## 2025-09-30 ENCOUNTER — Encounter: Admitting: Internal Medicine
